# Patient Record
Sex: Female | Born: 1975 | Race: White | Hispanic: No | Marital: Married | Smoking: Former smoker
Health system: Southern US, Community
[De-identification: ages and names within clinical notes are randomized; demographics above are authoritative.]

## PROBLEM LIST (undated history)

## (undated) DIAGNOSIS — R51 Headache: Secondary | ICD-10-CM

## (undated) DIAGNOSIS — F419 Anxiety disorder, unspecified: Secondary | ICD-10-CM

## (undated) DIAGNOSIS — R519 Headache, unspecified: Secondary | ICD-10-CM

## (undated) DIAGNOSIS — K900 Celiac disease: Secondary | ICD-10-CM

## (undated) DIAGNOSIS — M26629 Arthralgia of temporomandibular joint, unspecified side: Secondary | ICD-10-CM

## (undated) HISTORY — DX: Arthralgia of temporomandibular joint, unspecified side: M26.629

## (undated) HISTORY — DX: Headache: R51

## (undated) HISTORY — DX: Headache, unspecified: R51.9

## (undated) HISTORY — DX: Celiac disease: K90.0

## (undated) HISTORY — DX: Anxiety disorder, unspecified: F41.9

---

## 1998-06-25 HISTORY — PX: OTHER SURGICAL HISTORY: SHX169

## 1998-06-28 ENCOUNTER — Ambulatory Visit (HOSPITAL_BASED_OUTPATIENT_CLINIC_OR_DEPARTMENT_OTHER): Admission: RE | Admit: 1998-06-28 | Discharge: 1998-06-28 | Payer: Self-pay | Admitting: General Surgery

## 1998-11-18 ENCOUNTER — Other Ambulatory Visit: Admission: RE | Admit: 1998-11-18 | Discharge: 1998-11-18 | Payer: Self-pay | Admitting: Obstetrics and Gynecology

## 2000-01-26 ENCOUNTER — Other Ambulatory Visit: Admission: RE | Admit: 2000-01-26 | Discharge: 2000-01-26 | Payer: Self-pay | Admitting: Obstetrics and Gynecology

## 2000-03-18 ENCOUNTER — Encounter: Payer: Self-pay | Admitting: Obstetrics and Gynecology

## 2000-03-18 ENCOUNTER — Encounter: Admission: RE | Admit: 2000-03-18 | Discharge: 2000-03-18 | Payer: Self-pay | Admitting: Obstetrics and Gynecology

## 2000-10-17 ENCOUNTER — Encounter (INDEPENDENT_AMBULATORY_CARE_PROVIDER_SITE_OTHER): Payer: Self-pay | Admitting: *Deleted

## 2000-10-17 ENCOUNTER — Ambulatory Visit (HOSPITAL_BASED_OUTPATIENT_CLINIC_OR_DEPARTMENT_OTHER): Admission: RE | Admit: 2000-10-17 | Discharge: 2000-10-17 | Payer: Self-pay | Admitting: General Surgery

## 2000-12-09 ENCOUNTER — Other Ambulatory Visit: Admission: RE | Admit: 2000-12-09 | Discharge: 2000-12-09 | Payer: Self-pay | Admitting: Obstetrics and Gynecology

## 2001-12-05 ENCOUNTER — Other Ambulatory Visit: Admission: RE | Admit: 2001-12-05 | Discharge: 2001-12-05 | Payer: Self-pay | Admitting: Obstetrics and Gynecology

## 2002-05-10 ENCOUNTER — Emergency Department (HOSPITAL_COMMUNITY): Admission: EM | Admit: 2002-05-10 | Discharge: 2002-05-10 | Payer: Self-pay | Admitting: Emergency Medicine

## 2002-08-17 ENCOUNTER — Ambulatory Visit (HOSPITAL_COMMUNITY): Admission: RE | Admit: 2002-08-17 | Discharge: 2002-08-17 | Payer: Self-pay | Admitting: Obstetrics and Gynecology

## 2002-10-12 ENCOUNTER — Observation Stay (HOSPITAL_COMMUNITY): Admission: AD | Admit: 2002-10-12 | Discharge: 2002-10-13 | Payer: Self-pay | Admitting: Obstetrics and Gynecology

## 2002-10-16 ENCOUNTER — Inpatient Hospital Stay (HOSPITAL_COMMUNITY): Admission: AD | Admit: 2002-10-16 | Discharge: 2002-10-17 | Payer: Self-pay | Admitting: Obstetrics & Gynecology

## 2002-10-25 ENCOUNTER — Inpatient Hospital Stay (HOSPITAL_COMMUNITY): Admission: AD | Admit: 2002-10-25 | Discharge: 2002-10-28 | Payer: Self-pay | Admitting: Obstetrics and Gynecology

## 2002-10-26 ENCOUNTER — Encounter (INDEPENDENT_AMBULATORY_CARE_PROVIDER_SITE_OTHER): Payer: Self-pay | Admitting: Specialist

## 2002-11-25 ENCOUNTER — Other Ambulatory Visit: Admission: RE | Admit: 2002-11-25 | Discharge: 2002-11-25 | Payer: Self-pay | Admitting: Obstetrics and Gynecology

## 2004-02-03 ENCOUNTER — Other Ambulatory Visit: Admission: RE | Admit: 2004-02-03 | Discharge: 2004-02-03 | Payer: Self-pay | Admitting: Obstetrics and Gynecology

## 2005-02-09 ENCOUNTER — Other Ambulatory Visit: Admission: RE | Admit: 2005-02-09 | Discharge: 2005-02-09 | Payer: Self-pay | Admitting: Obstetrics and Gynecology

## 2006-09-27 ENCOUNTER — Ambulatory Visit (HOSPITAL_COMMUNITY): Admission: RE | Admit: 2006-09-27 | Discharge: 2006-09-27 | Payer: Self-pay | Admitting: Obstetrics and Gynecology

## 2006-10-18 ENCOUNTER — Inpatient Hospital Stay (HOSPITAL_COMMUNITY): Admission: AD | Admit: 2006-10-18 | Discharge: 2006-10-18 | Payer: Self-pay | Admitting: Obstetrics and Gynecology

## 2006-10-24 ENCOUNTER — Inpatient Hospital Stay (HOSPITAL_COMMUNITY): Admission: AD | Admit: 2006-10-24 | Discharge: 2006-10-24 | Payer: Self-pay | Admitting: Obstetrics and Gynecology

## 2006-12-18 ENCOUNTER — Inpatient Hospital Stay (HOSPITAL_COMMUNITY): Admission: AD | Admit: 2006-12-18 | Discharge: 2006-12-21 | Payer: Self-pay | Admitting: Obstetrics & Gynecology

## 2010-03-01 ENCOUNTER — Encounter: Admission: RE | Admit: 2010-03-01 | Discharge: 2010-03-01 | Payer: Self-pay | Admitting: Obstetrics and Gynecology

## 2010-11-10 NOTE — Discharge Summary (Signed)
[name], [name]                         ACCOUNT NO.:  [id number]   MEDICAL RECORD NO.:  [id number]                   PATIENT TYPE:  INP   LOCATION:  9157                                 FACILITY:  [facility]   PHYSICIAN:  Gerrit Friends. Aldona Bar, M.D.                DATE OF BIRTH:  07/17/1975   DATE OF ADMISSION:  10/16/2002  DATE OF DISCHARGE:  10/17/2002                                 DISCHARGE SUMMARY   DISCHARGE DIAGNOSES:  1. A 36 week pregnancy, undelivered.  2. Uterine irritability - threatened labor (?).  3. Hypokalemia.   HISTORY OF PRESENT ILLNESS:  This 35 year old [name] was seen in  triage during the day on October 16, 2002 with abdominal pain at [redacted] weeks  gestation. Apparently several days earlier she was kept in the hospital for  a period of time for tocolysis with magnesium sulfate and discharged on  Procardia. She related increased pain at about noon on October 16, 2002 and  presented to triage. There was no response to subcutaneous Terbutaline times  two but her cervix was closed, 30% effaced and a vertex at a minus 2 to  minus 3 station. At the time of initial evaluation, there was more uterine  irritability than there was an actual contraction pattern seen. Fetal heart  rate was very reactive. The patient did received some relief with Stadol.  Her urine revealed 4+ ketones. A comprehensive metabolic profile revealed a  potassium of 2.8 with a sodium of 132, hemoglobin of 12.2 and a white count  of 10,900 and a normal platelet count. The patient was admitted and her  hypokalemia was treated. She was also given IV Stadol as needed for  discomfort. Fetal heart rate remained reactive. She responded well to IV  Stadol, IV fluids and an Ambien at bedtime. On the morning of October 17, 2002  her hemoglobin was again stable and her potassium was 4.1. Sodium was 143  and otherwise, her comprehensive metabolic profile was within normal limits.  Her cervix was examined and  again, was closed and maybe 30% effaced with the  vertex floating. The baby's heart rate was very reactive. The decision was  made to discharge the patient to home at relative inactivity with follow-up  in the office in 4-5 days or as needed.   DISCHARGE MEDICATIONS:  Includes Ambien 10 mg at bedtime, good hydration,  and she was told not to take any more Procardia, as had been prescribed for  her upon her previous hospital admission. She was actually given no  tocolysis except the two doses of Terbutaline subcutaneously on the early  afternoon of October 16, 2002. No further tocolysis was given in [redacted] weeks  gestation. I am not sure tocolysis would really be something that should be  continued. So, she is not going to use any further tocolysis.   CONDITION ON DISCHARGE:  Improved.  Gerrit Friends. Aldona Bar, M.D.    RMW/MEDQ  D:  10/17/2002  T:  10/17/2002  Job:  [id number]

## 2010-11-10 NOTE — Op Note (Signed)
[place]. [facility]  Patient:    [name], [name]                      MRN: [id number] Adm. Date:  [id number] Attending:  Henrene Dodge                           Operative Report  PREOPERATIVE DIAGNOSIS:  Tender left inguinal lymphadenopathy, previous diagnosis of a cat scratch disease at that area.  PROCEDURE:  Excision of two left inguinal lymph nodes.  ANESTHESIA:  Local anesthesia with sedation.  SURGEON:  Anselm Pancoast. Zachery Dakins, M.D.  HISTORY:  [name] is a 35 year old female who approximately two years ago had a nonspecific inflammation consistent with cat scratch in the left groin, and I removed the node here at [facility].  She improved, recently has been having tender lymph nodes just proximal to where this previous lymph node was removed, and she has no evidence of any gynecological infection, and saw me recently for this problem.  I recommended that we excise these palpable nodes.  It appears to be two nodes, like a little rope in the subcutaneous area, that is painful when you press on it.  The patient does not have any significant lymphadenopathy in the right side, and because of the discomfort, I recommended that we do it with MAC.  DESCRIPTION OF PROCEDURE:  The patient was taken to the operative suite.  IV had been started.  She was given some sedation, and I marked the palpable node while she was awake.  Then after sedation had been given, we prepped the area with Betadine solution and draped her in a sterile manner.  Xylocaine mixed with 0.25% Marcaine with adrenalin was used to infiltrate the area, and a small incision approximately an inch and a half in length was made.  The underlying subcutaneous tissue was opened up, and then this lymph node, it really was two lymph nodes, kind of like a _____, chronically fibrosed and all, were separated from the surrounding tissue onto a proximal pedicle, clamped with a hemostat, the  lymphoid tissue elevated, and then the dependent area clamped also with a hemostat.  The little pedicles were tied with 4-0 chromic, and then the tissue was sent for permanent exam, touch prep, lymphoma-type workup.  The subcutaneous tissue was closed with a 4-0 chromic, and then the skin was closed with 4-0 nylon simple sutures and a sterile occlusive dressing applied.  The patient will be released after a short stay, and we will have the pathology report back in approximately 48 hours.  Grossly this looks more like a kind of a fibrotic couple of lymph nodes, not that of acute, big, swollen nodes, and I will be very surprised if it is anything serious. DD:  10/17/00 TD:  10/18/00 Job: [id number]

## 2011-04-11 LAB — CBC
HCT: 36.9
HCT: 40.7
Hemoglobin: 12.5
Hemoglobin: 13.9
MCHC: 33.8
MCHC: 34.2
MCV: 92.1
MCV: 92.7
Platelets: 190
Platelets: 249
RBC: 3.98
RBC: 4.42
RDW: 13
RDW: 13
WBC: 10.3
WBC: 13.5 — ABNORMAL HIGH

## 2011-04-11 LAB — RH IMMUNE GLOB WKUP(>/=20WKS)(NOT WOMEN'S HOSP): Fetal Screen: POSITIVE

## 2011-04-11 LAB — RPR: RPR Ser Ql: NONREACTIVE

## 2011-04-11 LAB — KLEIHAUER-BETKE STAIN
# Vials RhIg: 1
Fetal Cells %: 0.1
Quantitation Fetal Hemoglobin: 5

## 2011-10-29 ENCOUNTER — Encounter: Payer: Self-pay | Admitting: Gastroenterology

## 2011-11-26 ENCOUNTER — Ambulatory Visit (INDEPENDENT_AMBULATORY_CARE_PROVIDER_SITE_OTHER): Payer: BC Managed Care – PPO | Admitting: Gastroenterology

## 2011-11-26 ENCOUNTER — Encounter: Payer: Self-pay | Admitting: Gastroenterology

## 2011-11-26 ENCOUNTER — Other Ambulatory Visit: Payer: BC Managed Care – PPO

## 2011-11-26 VITALS — BP 100/66 | HR 64 | Ht 63.0 in | Wt 114.6 lb

## 2011-11-26 DIAGNOSIS — R109 Unspecified abdominal pain: Secondary | ICD-10-CM

## 2011-11-26 DIAGNOSIS — R140 Abdominal distension (gaseous): Secondary | ICD-10-CM

## 2011-11-26 DIAGNOSIS — R141 Gas pain: Secondary | ICD-10-CM

## 2011-11-26 DIAGNOSIS — R143 Flatulence: Secondary | ICD-10-CM

## 2011-11-26 NOTE — Patient Instructions (Signed)
You will have labs checked today in the basement lab.  Please head down after you check out with the front desk  (celiac panel). 

## 2011-11-26 NOTE — Progress Notes (Signed)
HPI: This is a   very pleasant 36 year old woman whose mother is a patient of mine.  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  She made her daughter come.    She has daily bloating usually in PM, afternoon.  Pain and gassiness, very foul smelling.    For one week she went 75% gluten free, this was 2-3 weeks.  Was very constipated in past, needed miralax.  She can alternate, will have loose stool. Much more loose lately.  Review of systems: Pertinent positive and negative review of systems were noted in the above HPI section. Complete review of systems was performed and was otherwise normal.    Past Medical History  Diagnosis Date  . Anxiety     Past Surgical History  Procedure Date  . Lymph node removal     Current Outpatient Prescriptions  Medication Sig Dispense Refill  . Drospirenone-Ethinyl Estradiol-Levomefol (BEYAZ) 3-0.02-0.451 MG tablet Take 1 tablet by mouth daily.      Marland Kitchen PARoxetine (PAXIL) 20 MG tablet Take 20 mg by mouth every morning.        Allergies as of 11/26/2011  . (No Known Allergies)    Family History  Problem Relation Age of Onset  . Hypertension Mother   . Diabetes Mother   . Coronary artery disease Maternal Grandfather   . Celiac disease Mother     History   Social History  . Marital Status: Married    Spouse Name: N/A    Number of Children: 2  . Years of Education: N/A   Occupational History  . Print production planner    Social History Main Topics  . Smoking status: Former Games developer  . Smokeless tobacco: Never Used  . Alcohol Use: No     occasional   . Drug Use: No  . Sexually Active: Not on file   Other Topics Concern  . Not on file   Social History Narrative  . No narrative on file       Physical Exam: BP 100/66  Pulse 64  Ht 5\' 3"  (1.6 m)  Wt 114 lb 9.6 oz (51.982 kg)  BMI 20.30 kg/m2  LMP 11/09/2011 Constitutional: generally well-appearing Psychiatric: alert and oriented x3 Eyes: extraocular movements intact Mouth:  oral pharynx moist, no lesions Neck: supple no lymphadenopathy Cardiovascular: heart regular rate and rhythm Lungs: clear to auscultation bilaterally Abdomen: soft, nontender, nondistended, no obvious ascites, no peritoneal signs, normal bowel sounds Extremities: no lower extremity edema bilaterally Skin: no lesions on visible extremities    Assessment and plan: 36 y.o. female with  bloating, intermittent loose stools, gassiness, mother recently diagnosed with celiac sprue  Think it is definitely possible that she also has celiac sprue. We'll proceed with blood tests and if positive then confirmatory biopsy.

## 2011-11-28 LAB — CELIAC PANEL 10
Endomysial Screen: POSITIVE — AB
Gliadin IgA: 102 U/mL — ABNORMAL HIGH (ref <20)
Gliadin IgG: 196 U/mL — ABNORMAL HIGH (ref <20)
IgA: 224 mg/dL (ref 69–380)
Tissue Transglut Ab: 5.3 U/mL (ref <20)
Tissue Transglutaminase Ab, IgA: 111 U/mL — ABNORMAL HIGH (ref <20)

## 2011-11-28 LAB — ENDOMYSIAL AB IGA TITER: Endomysial Titer: 1:40 {titer} — ABNORMAL HIGH

## 2011-11-29 ENCOUNTER — Telehealth: Payer: Self-pay | Admitting: Gastroenterology

## 2011-11-29 NOTE — Telephone Encounter (Signed)
Pt scheduled for EGD and previsit

## 2011-12-03 ENCOUNTER — Telehealth: Payer: Self-pay | Admitting: Gastroenterology

## 2011-12-03 NOTE — Telephone Encounter (Signed)
Pt would like to move her EGD up because of abd pain and cramping.  Moved to 12/12/11.  Pre visit also moved up and pt is aware

## 2011-12-07 ENCOUNTER — Ambulatory Visit (AMBULATORY_SURGERY_CENTER): Payer: BC Managed Care – PPO | Admitting: *Deleted

## 2011-12-07 VITALS — Ht 64.0 in | Wt 114.0 lb

## 2011-12-07 DIAGNOSIS — K900 Celiac disease: Secondary | ICD-10-CM

## 2011-12-12 ENCOUNTER — Encounter: Payer: Self-pay | Admitting: Gastroenterology

## 2011-12-12 ENCOUNTER — Other Ambulatory Visit: Payer: Self-pay

## 2011-12-12 ENCOUNTER — Ambulatory Visit (AMBULATORY_SURGERY_CENTER): Payer: BC Managed Care – PPO | Admitting: Gastroenterology

## 2011-12-12 VITALS — BP 118/65 | HR 78 | Temp 96.5°F | Resp 16 | Ht 64.0 in | Wt 114.0 lb

## 2011-12-12 DIAGNOSIS — K900 Celiac disease: Secondary | ICD-10-CM

## 2011-12-12 DIAGNOSIS — K209 Esophagitis, unspecified without bleeding: Secondary | ICD-10-CM

## 2011-12-12 DIAGNOSIS — R1310 Dysphagia, unspecified: Secondary | ICD-10-CM

## 2011-12-12 MED ORDER — SODIUM CHLORIDE 0.9 % IV SOLN: 500.0000 mL | INTRAVENOUS | Status: DC

## 2011-12-12 NOTE — Op Note (Signed)
[facility] [address] [place], [place]  [address]  ENDOSCOPY PROCEDURE REPORT  PATIENT:  [name], [name]  MR#:  [id number] BIRTHDATE:  Aug 12, 1975, 35 yrs. old  GENDER:  female ENDOSCOPIST:  Rachael Fee, MD PROCEDURE DATE:  12/12/2011 PROCEDURE:  EGD with biopsy, 43239 ASA CLASS:  Class I INDICATIONS:  mother with sprue, she has dyspepsia, dysphagia, very elevated sprue serologies MEDICATIONS:  Fentanyl 50 mcg IV, These medications were titrated to patient response per physician's verbal order, Versed 5 mg IV TOPICAL ANESTHETIC:  Cetacaine Spray  DESCRIPTION OF PROCEDURE:   After the risks benefits and alternatives of the procedure were thoroughly explained, informed consent was obtained.  The [facility] [id number] endoscope was introduced through the mouth and advanced to the second portion of the duodenum, without limitations.  The instrument was slowly withdrawn as the mucosa was fully examined. <<PROCEDUREIMAGES>> Abnormal appearing mucosa. The mucosa in duodenum with edematous, irregular appearing. 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  Esophagitis was found. There was mild esophagitis (edema) at GE junction (see image1).  Otherwise the examination was normal (see image4, image5, image1, and image6).    Retroflexed views revealed no abnormalities.    The scope was then withdrawn from the patient and the procedure completed. 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 Biopsies taken. 2) (likely) GERD related edema in distal esophagus 3) Otherwise normal examination  RECOMMENDATIONS: Dr. Christella Hartigan' office will arrange for dietician referral to help with avoiding gluten. Please try once daily OTC prilosec or prevacid or generic equivalent for your (probably GERD related) swallowing trouble. Call to report on your symptoms in 4-5  weeks.  ______________________________ Rachael Fee, MD  n. eSIGNED:   Rachael Fee at 12/12/2011 03:28 PM  [place], [phone]

## 2011-12-12 NOTE — Patient Instructions (Addendum)
YOU HAD AN ENDOSCOPIC PROCEDURE TODAY AT THE [facility]: Refer to the procedure report that was given to you for any specific questions about what was found during the examination.  If the procedure report does not answer your questions, please call your gastroenterologist to clarify.  If you requested that your care partner not be given the details of your procedure findings, then the procedure report has been included in a sealed envelope for you to review at your convenience later.  YOU SHOULD EXPECT: Some feelings of bloating in the abdomen. Passage of more gas than usual.  Walking can help get rid of the air that was put into your GI tract during the procedure and reduce the bloating. If you had a lower endoscopy (such as a colonoscopy or flexible sigmoidoscopy) you may notice spotting of blood in your stool or on the toilet paper. If you underwent a bowel prep for your procedure, then you may not have a normal bowel movement for a few days.  DIET: Your first meal following the procedure should be a light meal and then it is ok to progress to your normal diet.  A half-sandwich or bowl of soup is an example of a good first meal.  Heavy or fried foods are harder to digest and may make you feel nauseous or bloated.  Likewise meals heavy in dairy and vegetables can cause extra gas to form and this can also increase the bloating.  Drink plenty of fluids but you should avoid alcoholic beverages for 24 hours.  ACTIVITY: Your care partner should take you home directly after the procedure.  You should plan to take it easy, moving slowly for the rest of the day.  You can resume normal activity the day after the procedure however you should NOT DRIVE or use heavy machinery for 24 hours (because of the sedation medicines used during the test).    SYMPTOMS TO REPORT IMMEDIATELY: A gastroenterologist can be reached at any hour.  During normal business hours, 8:30 AM to 5:00 PM Monday through Friday,  call [phone].  After hours and on weekends, please call the GI answering service at [phone] who will take a message and have the physician on call contact you.    Following upper endoscopy (EGD)  Vomiting of blood or coffee ground material  New chest pain or pain under the shoulder blades  Painful or persistently difficult swallowing  New shortness of breath  Fever of 100F or higher  Black, tarry-looking stools  FOLLOW UP: If any biopsies were taken you will be contacted by phone or by letter within the next 1-3 weeks.  Call your gastroenterologist if you have not heard about the biopsies in 3 weeks.  Our staff will call the home number listed on your records the next business day following your procedure to check on you and address any questions or concerns that you may have at that time regarding the information given to you following your procedure. This is a courtesy call and so if there is no answer at the home number and we have not heard from you through the emergency physician on call, we will assume that you have returned to your regular daily activities without incident.  SIGNATURES/CONFIDENTIALITY: You and/or your care partner have signed paperwork which will be entered into your electronic medical record.  These signatures attest to the fact that that the information above on your After Visit Summary has been reviewed and is understood.  Full   responsibility of the confidentiality of this discharge information lies with you and/or your care-partner.   Resume medications. Information given on sprue and gerd. Office will contact pt. With dietician referral

## 2011-12-12 NOTE — Progress Notes (Signed)
Patient did not experience any of the following events: a burn prior to discharge; a fall within the facility; wrong site/side/patient/procedure/implant event; or a hospital transfer or hospital admission upon discharge from the facility. (G8907) Patient did not have preoperative order for IV antibiotic SSI prophylaxis. (G8918)  

## 2011-12-13 ENCOUNTER — Telehealth: Payer: Self-pay | Admitting: *Deleted

## 2011-12-13 NOTE — Telephone Encounter (Signed)
  Follow up Call-  Call back number 12/12/2011  Post procedure Call Back phone  # [phone]  Permission to leave phone message Yes     Patient questions:  Do you have a fever, pain , or abdominal swelling? no Pain Score  0 *  Have you tolerated food without any problems? yes  Have you been able to return to your normal activities? yes  Do you have any questions about your discharge instructions: Diet   no Medications  no Follow up visit  no  Do you have questions or concerns about your Care? no  Actions: * If pain score is 4 or above: No action needed, pain <4.

## 2011-12-24 ENCOUNTER — Other Ambulatory Visit: Payer: BC Managed Care – PPO | Admitting: Gastroenterology

## 2011-12-31 ENCOUNTER — Telehealth: Payer: Self-pay | Admitting: Gastroenterology

## 2011-12-31 NOTE — Telephone Encounter (Signed)
Pt has been notified of her lab results and will make ROV in 6 months with labs prior

## 2012-05-04 ENCOUNTER — Telehealth: Payer: Self-pay | Admitting: Gastroenterology

## 2012-05-04 NOTE — Telephone Encounter (Signed)
Pt c/o abdominal distention and gas for past 2-3 days.   Denies abdominal pain.  Has celiac disease but is unaware of a dietary indiscretion.  Instructed to call office if symptoms do not improve next 24 hours

## 2012-05-05 ENCOUNTER — Ambulatory Visit (INDEPENDENT_AMBULATORY_CARE_PROVIDER_SITE_OTHER): Payer: BC Managed Care – PPO | Admitting: Gastroenterology

## 2012-05-05 ENCOUNTER — Encounter: Payer: Self-pay | Admitting: Gastroenterology

## 2012-05-05 ENCOUNTER — Other Ambulatory Visit (INDEPENDENT_AMBULATORY_CARE_PROVIDER_SITE_OTHER): Payer: BC Managed Care – PPO

## 2012-05-05 ENCOUNTER — Ambulatory Visit (HOSPITAL_COMMUNITY)
Admission: RE | Admit: 2012-05-05 | Discharge: 2012-05-05 | Disposition: A | Discharge status: Home / Self Care | Payer: BC Managed Care – PPO | Source: Ambulatory Visit | Attending: Gastroenterology | Admitting: Gastroenterology

## 2012-05-05 ENCOUNTER — Telehealth: Payer: Self-pay | Admitting: Gastroenterology

## 2012-05-05 VITALS — BP 100/78 | HR 80 | Ht 63.0 in | Wt 116.1 lb

## 2012-05-05 DIAGNOSIS — K900 Celiac disease: Secondary | ICD-10-CM

## 2012-05-05 DIAGNOSIS — R109 Unspecified abdominal pain: Secondary | ICD-10-CM

## 2012-05-05 DIAGNOSIS — N949 Unspecified condition associated with female genital organs and menstrual cycle: Secondary | ICD-10-CM | POA: Insufficient documentation

## 2012-05-05 DIAGNOSIS — Z975 Presence of (intrauterine) contraceptive device: Secondary | ICD-10-CM | POA: Insufficient documentation

## 2012-05-05 DIAGNOSIS — K5900 Constipation, unspecified: Secondary | ICD-10-CM | POA: Insufficient documentation

## 2012-05-05 LAB — CBC WITH DIFFERENTIAL/PLATELET
Basophils Absolute: 0 10*3/uL (ref 0.0–0.1)
Basophils Relative: 0.7 % (ref 0.0–3.0)
Eosinophils Absolute: 0.1 10*3/uL (ref 0.0–0.7)
Eosinophils Relative: 1.2 % (ref 0.0–5.0)
HCT: 38.1 % (ref 36.0–46.0)
Hemoglobin: 12.7 g/dL (ref 12.0–15.0)
Lymphocytes Relative: 38 % (ref 12.0–46.0)
Lymphs Abs: 2.5 10*3/uL (ref 0.7–4.0)
MCHC: 33.3 g/dL (ref 30.0–36.0)
MCV: 90.7 fl (ref 78.0–100.0)
Monocytes Absolute: 0.5 10*3/uL (ref 0.1–1.0)
Monocytes Relative: 8.2 % (ref 3.0–12.0)
Neutro Abs: 3.3 10*3/uL (ref 1.4–7.7)
Neutrophils Relative %: 51.9 % (ref 43.0–77.0)
Platelets: 256 10*3/uL (ref 150.0–400.0)
RBC: 4.2 Mil/uL (ref 3.87–5.11)
RDW: 12.6 % (ref 11.5–14.6)
WBC: 6.4 10*3/uL (ref 4.5–10.5)

## 2012-05-05 LAB — COMPREHENSIVE METABOLIC PANEL
ALT: 29 U/L (ref 0–35)
AST: 26 U/L (ref 0–37)
Albumin: 4.1 g/dL (ref 3.5–5.2)
Alkaline Phosphatase: 65 U/L (ref 39–117)
BUN: 11 mg/dL (ref 6–23)
CO2: 28 mEq/L (ref 19–32)
Calcium: 9.5 mg/dL (ref 8.4–10.5)
Chloride: 104 mEq/L (ref 96–112)
Creatinine, Ser: 0.5 mg/dL (ref 0.4–1.2)
GFR: 145.86 mL/min (ref >60.00)
Glucose, Bld: 78 mg/dL (ref 70–99)
Potassium: 4.3 mEq/L (ref 3.5–5.1)
Sodium: 139 mEq/L (ref 135–145)
Total Bilirubin: 0.4 mg/dL (ref 0.3–1.2)
Total Protein: 7.3 g/dL (ref 6.0–8.3)

## 2012-05-05 MED ORDER — IOHEXOL 300 MG/ML  SOLN
80.0000 mL | Freq: Once | INTRAMUSCULAR | Status: AC | PRN
  Administered 2012-05-05: 80 mL via INTRAVENOUS

## 2012-05-05 NOTE — Patient Instructions (Addendum)
You will have labs checked today in the basement lab.  Please head down after you check out with the front desk  (cbc, cmet, urine pregnancy test (stat). You will be set up for a CT scan of abdomen and pelvis with IV and oral contrast (for lower abd pains).  You have been scheduled for a CT scan of the abdomen and pelvis at [facility] You are scheduled on 05/05/12 NOW. You should arrive 15 minutes prior to your appointment time for registration. Please follow the written instructions below on the day of your exam:

## 2012-05-05 NOTE — Telephone Encounter (Signed)
Patty, can you call her this AM to see how she is feeling.

## 2012-05-05 NOTE — Telephone Encounter (Signed)
Pt has appt today with Dr Christella Hartigan

## 2012-05-05 NOTE — Telephone Encounter (Signed)
Pt has been added to the schedule for today with Dr Christella Hartigan 3 pm

## 2012-05-05 NOTE — Progress Notes (Signed)
Review of pertinent gastrointestinal problems: 1. Celiac Sprue:  TTG very elevated 2013 (111), mother had Sprue; EGD 11/2011 with abnormal duodenum, biopsies + for sprue  HPI: This is a  very pleasant 36 year old woman whom I last saw several months ago. She called this morning with some abdominal distention and pain. Considering for urgent visit.  Thursday night lower abd pain, has had anal spasm.  No diarrhea. No bleeding. She has no bleeding, no nausea, no vomiting.    Has been on probiotics.    Chugging mylanta.    She feels very consitpated and gassy.  A lot of abdominal pressure.  Has IUD in place.  No vaginal bleeding.  + flatus but not enough     Past Medical History  Diagnosis Date  . Anxiety   . TMJ arthralgia     Past Surgical History  Procedure Date  . Lymph node removal     Current Outpatient Prescriptions  Medication Sig Dispense Refill  . ibuprofen (ADVIL,MOTRIN) 800 MG tablet Take 800 mg by mouth every 8 (eight) hours as needed. TMJ pain      . Multiple Vitamin (MULTIVITAMIN) tablet Take 1 tablet by mouth daily.      Marland Kitchen PARoxetine (PAXIL) 20 MG tablet Take 20 mg by mouth every morning.      . Probiotic Product (PROBIOTIC DAILY PO) Take 1 tablet by mouth daily.       Current Facility-Administered Medications  Medication Dose Route Frequency Provider Last Rate Last Dose  . 0.9 %  sodium chloride infusion  500 mL Intravenous Continuous Rachael Fee, MD        Allergies as of 05/05/2012  . (No Known Allergies)    Family History  Problem Relation Age of Onset  . Hypertension Mother   . Diabetes Mother   . Celiac disease Mother   . Coronary artery disease Maternal Grandfather     History   Social History  . Marital Status: Married    Spouse Name: N/A    Number of Children: 2  . Years of Education: N/A   Occupational History  . Print production planner    Social History Main Topics  . Smoking status: Former Games developer  . Smokeless tobacco: Never Used    . Alcohol Use: No     Comment: occasional   . Drug Use: No  . Sexually Active: Not on file   Other Topics Concern  . Not on file   Social History Narrative  . No narrative on file      Physical Exam: BP 100/78  Pulse 80  Ht 5\' 3"  (1.6 m)  Wt 116 lb 2 oz (52.674 kg)  BMI 20.57 kg/m2 Constitutional: generally well-appearing Psychiatric: alert and oriented x3 Abdomen: soft, mildly tender throughout somewhat worse on the right lower quadrant, nondistended, no obvious ascites, no peritoneal signs, normal bowel sounds     Assessment and plan: 36 y.o. female with abdominal pain, distention  She is uncomfortable, somewhat distended in her low abdomen. She appears not an extremist at all but she is clearly uncomfortable. This is fairly    new for her in the past few days. She has had no fevers or chills but I'm concerned about potential appendicitis, perhaps gynecologic issue such as ovarian cysts. She is going to get a CT scan today as well as labs, urine pregnancy test on a stat basis. On call tonight plan to be called by the radiologist.

## 2012-05-06 LAB — PREGNANCY, URINE: Preg Test, Ur: NEGATIVE

## 2012-05-06 NOTE — Progress Notes (Signed)
i agree, thanks 

## 2012-06-02 ENCOUNTER — Ambulatory Visit (INDEPENDENT_AMBULATORY_CARE_PROVIDER_SITE_OTHER): Payer: BC Managed Care – PPO | Admitting: Gastroenterology

## 2012-06-02 ENCOUNTER — Encounter: Payer: Self-pay | Admitting: Gastroenterology

## 2012-06-02 VITALS — BP 92/60 | HR 76 | Ht 63.0 in | Wt 116.0 lb

## 2012-06-02 DIAGNOSIS — K589 Irritable bowel syndrome without diarrhea: Secondary | ICD-10-CM

## 2012-06-02 DIAGNOSIS — R933 Abnormal findings on diagnostic imaging of other parts of digestive tract: Secondary | ICD-10-CM

## 2012-06-02 MED ORDER — HYOSCYAMINE SULFATE 0.125 MG SL SUBL: 0.1250 mg | SUBLINGUAL_TABLET | SUBLINGUAL | Status: DC | PRN

## 2012-06-02 NOTE — Progress Notes (Signed)
Review of pertinent gastrointestinal problems:  1. Celiac Sprue: TTG very elevated 2013 (111), mother had Sprue; EGD 11/2011 with abnormal duodenum, biopsies + for sprue 2. abd pain 04/2012: cbc, cmet, preg test all neg: CT scan showed signficant stool burden, also abnormal soft tissue in ascending colon.  Pains improved with bowel purge.   HPI: This is a  very pleasant 36 year old woman who I am last saw several weeks ago.  She felt much better after miralax purge.  She has alternating bowels still but the severe pains are much better.  She tried miralax once daily and this helps.  She tried fiber supplements a long time ago.      Past Medical History  Diagnosis Date  . Anxiety   . TMJ arthralgia     Past Surgical History  Procedure Date  . Lymph node removal     Current Outpatient Prescriptions  Medication Sig Dispense Refill  . ibuprofen (ADVIL,MOTRIN) 800 MG tablet Take 800 mg by mouth every 8 (eight) hours as needed. TMJ pain      . Multiple Vitamin (MULTIVITAMIN) tablet Take 1 tablet by mouth daily.      Marland Kitchen PARoxetine (PAXIL) 20 MG tablet Take 20 mg by mouth every morning.      . polyethylene glycol (MIRALAX / GLYCOLAX) packet Take 17 g by mouth daily.       Current Facility-Administered Medications  Medication Dose Route Frequency Provider Last Rate Last Dose  . [DISCONTINUED] 0.9 %  sodium chloride infusion  500 mL Intravenous Continuous Rachael Fee, MD        Allergies as of 06/02/2012  . (No Known Allergies)    Family History  Problem Relation Age of Onset  . Hypertension Mother   . Diabetes Mother   . Celiac disease Mother   . Coronary artery disease Maternal Grandfather     History   Social History  . Marital Status: Married    Spouse Name: N/A    Number of Children: 2  . Years of Education: N/A   Occupational History  . Print production planner    Social History Main Topics  . Smoking status: Former Games developer  . Smokeless tobacco: Never Used  . Alcohol  Use: No     Comment: occasional   . Drug Use: No  . Sexually Active: Not on file   Other Topics Concern  . Not on file   Social History Narrative  . No narrative on file      Physical Exam: BP 92/60  Pulse 76  Ht 5\' 3"  (1.6 m)  Wt 116 lb (52.617 kg)  BMI 20.55 kg/m2 Constitutional: generally well-appearing Psychiatric: alert and oriented x3 Abdomen: soft, nontender, nondistended, no obvious ascites, no peritoneal signs, normal bowel sounds     Assessment and plan: 36 y.o. female with chronic constipation, IBS, abnormal descending colon on recent CT scan  I do not think that she has any neoplastic process or even inflammatory bowel disease clinically however the CT scan is abnormal in the 80s in colon we should proceed with colonoscopy at her soonest convenience. In the meantime she will start taking her MiraLax on a daily basis rather than when necessary. I have also given her a new prescription for sublingual antispasm medicines to help with her cramping that bothers her about once to twice a week.

## 2012-06-02 NOTE — Patient Instructions (Addendum)
You will be set up for a colonoscopy for alternating bowel habits, irregular colon on CT.  Moderate sedation, LEC. Change so that you take the miralax powder every day rather than 3-4 times per week. Trial of sublingual antispasm medicine.

## 2012-06-07 ENCOUNTER — Telehealth: Payer: Self-pay | Admitting: Internal Medicine

## 2012-06-07 NOTE — Telephone Encounter (Signed)
movi prep not at pharmacy. I called it in for her.

## 2012-06-09 ENCOUNTER — Encounter: Payer: Self-pay | Admitting: Gastroenterology

## 2012-06-09 ENCOUNTER — Ambulatory Visit (AMBULATORY_SURGERY_CENTER): Payer: BC Managed Care – PPO | Admitting: Gastroenterology

## 2012-06-09 VITALS — BP 92/48 | HR 77 | Resp 15

## 2012-06-09 DIAGNOSIS — R933 Abnormal findings on diagnostic imaging of other parts of digestive tract: Secondary | ICD-10-CM

## 2012-06-09 DIAGNOSIS — K589 Irritable bowel syndrome without diarrhea: Secondary | ICD-10-CM

## 2012-06-09 MED ORDER — SODIUM CHLORIDE 0.9 % IV SOLN: 500.0000 mL | INTRAVENOUS | Status: DC

## 2012-06-09 NOTE — Op Note (Signed)
[facility] [address] [place], [address]   COLONOSCOPY PROCEDURE REPORT  PATIENT: [name], [name]  MR#: [id number] BIRTHDATE: July 23, 1975 , 36  yrs. old GENDER: Female ENDOSCOPIST: Rachael Fee, MD PROCEDURE DATE:  06/09/2012 PROCEDURE:   Colonoscopy, diagnostic ASA CLASS:   Class II INDICATIONS:recent CT scan suggested abnormal ascending colon. MEDICATIONS: Fentanyl 75 mcg IV and Versed 8 mg IV  DESCRIPTION OF PROCEDURE:   After the risks benefits and alternatives of the procedure were thoroughly explained, informed consent was obtained.  A digital rectal exam revealed no abnormalities of the rectum.   The LB CF-H180AL [id number] and LB PCF-H180AL [id number]  endoscope was introduced through the anus and advanced to the cecum, which was identified by both the appendix and ileocecal valve. No adverse events experienced.   The quality of the prep was good, using MoviPrep  The instrument was then slowly withdrawn as the colon was fully examined.   COLON FINDINGS: A normal appearing cecum, ileocecal valve, and appendiceal orifice were identified.  The ascending, hepatic flexure, transverse, splenic flexure, descending, sigmoid colon and rectum appeared unremarkable.  No polyps or cancers were seen. Retroflexed views revealed no abnormalities. The time to cecum=3 minutes 58 seconds.  Withdrawal time=6 minutes 41 seconds.  The scope was withdrawn and the procedure completed. COMPLICATIONS: There were no complications.  ENDOSCOPIC IMPRESSION: Normal colon  RECOMMENDATIONS: Continue miralax daily and antipasmodics as needed. Routine colon cancer screening should start at age 48 (16 years)   eSigned:  Rachael Fee, MD 06/09/2012 2:44 PM

## 2012-06-09 NOTE — Patient Instructions (Signed)
YOU HAD AN ENDOSCOPIC PROCEDURE TODAY AT THE [facility]: Refer to the procedure report that was given to you for any specific questions about what was found during the examination.  If the procedure report does not answer your questions, please call your gastroenterologist to clarify.  If you requested that your care partner not be given the details of your procedure findings, then the procedure report has been included in a sealed envelope for you to review at your convenience later.  YOU SHOULD EXPECT: Some feelings of bloating in the abdomen. Passage of more gas than usual.  Walking can help get rid of the air that was put into your GI tract during the procedure and reduce the bloating. If you had a lower endoscopy (such as a colonoscopy or flexible sigmoidoscopy) you may notice spotting of blood in your stool or on the toilet paper. If you underwent a bowel prep for your procedure, then you may not have a normal bowel movement for a few days.  DIET: Your first meal following the procedure should be a light meal and then it is ok to progress to your normal diet.  A half-sandwich or bowl of soup is an example of a good first meal.  Heavy or fried foods are harder to digest and may make you feel nauseous or bloated.  Likewise meals heavy in dairy and vegetables can cause extra gas to form and this can also increase the bloating.  Drink plenty of fluids but you should avoid alcoholic beverages for 24 hours.  ACTIVITY: Your care partner should take you home directly after the procedure.  You should plan to take it easy, moving slowly for the rest of the day.  You can resume normal activity the day after the procedure however you should NOT DRIVE or use heavy machinery for 24 hours (because of the sedation medicines used during the test).    SYMPTOMS TO REPORT IMMEDIATELY: A gastroenterologist can be reached at any hour.  During normal business hours, 8:30 AM to 5:00 PM Monday through Friday,  call [phone].  After hours and on weekends, please call the GI answering service at [phone] who will take a message and have the physician on call contact you.   Following lower endoscopy (colonoscopy or flexible sigmoidoscopy):  Excessive amounts of blood in the stool  Significant tenderness or worsening of abdominal pains  Swelling of the abdomen that is new, acute  Fever of 100F or higher    FOLLOW UP: If any biopsies were taken you will be contacted by phone or by letter within the next 1-3 weeks.  Call your gastroenterologist if you have not heard about the biopsies in 3 weeks.  Our staff will call the home number listed on your records the next business day following your procedure to check on you and address any questions or concerns that you may have at that time regarding the information given to you following your procedure. This is a courtesy call and so if there is no answer at the home number and we have not heard from you through the emergency physician on call, we will assume that you have returned to your regular daily activities without incident.  SIGNATURES/CONFIDENTIALITY: You and/or your care partner have signed paperwork which will be entered into your electronic medical record.  These signatures attest to the fact that that the information above on your After Visit Summary has been reviewed and is understood.  Full responsibility of the confidentiality   of this discharge information lies with you and/or your care-partner.     

## 2012-06-09 NOTE — Progress Notes (Signed)
Patient did not experience any of the following events: a burn prior to discharge; a fall within the facility; wrong site/side/patient/procedure/implant event; or a hospital transfer or hospital admission upon discharge from the facility. (G8907) Patient did not have preoperative order for IV antibiotic SSI prophylaxis. (G8918)  

## 2012-06-10 ENCOUNTER — Telehealth: Payer: Self-pay | Admitting: *Deleted

## 2012-06-10 NOTE — Telephone Encounter (Signed)
  Follow up Call-  Call back number 06/09/2012 12/12/2011  Post procedure Call Back phone  # [phone]  Permission to leave phone message Yes Yes    LMOM

## 2012-07-02 ENCOUNTER — Telehealth: Payer: Self-pay

## 2012-07-02 NOTE — Telephone Encounter (Signed)
Pt needs ROV and Iga and TTG voice mail was full and I was unable to leave a message.

## 2012-07-02 NOTE — Telephone Encounter (Signed)
Message copied by Donata Duff on Wed Jul 02, 2012  8:12 AM ------      Message from: Donata Duff      Created: Mon Dec 31, 2011  1:13 PM       Pt needs labs and rov see results note

## 2012-07-03 NOTE — Telephone Encounter (Signed)
Voice mail is full and can not take any more messages letter mailed

## 2012-07-09 ENCOUNTER — Other Ambulatory Visit: Payer: Self-pay

## 2012-07-09 DIAGNOSIS — K900 Celiac disease: Secondary | ICD-10-CM

## 2012-07-29 ENCOUNTER — Ambulatory Visit (INDEPENDENT_AMBULATORY_CARE_PROVIDER_SITE_OTHER): Payer: BC Managed Care – PPO | Admitting: Gastroenterology

## 2012-07-29 ENCOUNTER — Encounter: Payer: Self-pay | Admitting: Gastroenterology

## 2012-07-29 ENCOUNTER — Other Ambulatory Visit (INDEPENDENT_AMBULATORY_CARE_PROVIDER_SITE_OTHER): Payer: BC Managed Care – PPO

## 2012-07-29 VITALS — BP 108/62 | HR 66 | Ht 63.0 in | Wt 114.0 lb

## 2012-07-29 DIAGNOSIS — R140 Abdominal distension (gaseous): Secondary | ICD-10-CM

## 2012-07-29 DIAGNOSIS — K900 Celiac disease: Secondary | ICD-10-CM

## 2012-07-29 DIAGNOSIS — R141 Gas pain: Secondary | ICD-10-CM

## 2012-07-29 DIAGNOSIS — R143 Flatulence: Secondary | ICD-10-CM

## 2012-07-29 LAB — IGA: IgA: 240 mg/dL (ref 68–378)

## 2012-07-29 NOTE — Patient Instructions (Addendum)
You will have labs checked today in the basement lab.  Please head down after you check out with the front desk  (celiac panel).

## 2012-07-29 NOTE — Progress Notes (Signed)
Review of pertinent gastrointestinal problems:  1. Celiac Sprue: TTG very elevated 2013 (111), mother had Sprue; EGD 11/2011 with abnormal duodenum, biopsies + for sprue  2. abd pain 04/2012: cbc, cmet, preg test all neg: CT scan showed signficant stool burden, also abnormal soft tissue in ascending colon. Pains improved with bowel purge. 3. Colonoscopy 12/13 for abnormal CT scan (above) was normal   HPI: This is a  very pleasant 37 year old woman whom I last saw a few months ago.  She still has bloating.  She has BMs daily.  She completely avoids fiber supplements. She takes Gas-X several times a day. She tries to avoid gluten as best as she can but she knows some get in very rarely  She eats very rarely at restaurant.  Past Medical History  Diagnosis Date  . Anxiety   . TMJ arthralgia   . Celiac sprue     Past Surgical History  Procedure Date  . Lymph node removal     Current Outpatient Prescriptions  Medication Sig Dispense Refill  . hyoscyamine (LEVSIN/SL) 0.125 MG SL tablet Place 1 tablet (0.125 mg total) under the tongue every 4 (four) hours as needed for cramping.  60 tablet  3  . Multiple Vitamin (MULTIVITAMIN) tablet Take 1 tablet by mouth daily.      Marland Kitchen PARoxetine (PAXIL) 20 MG tablet Take 20 mg by mouth every morning.      . polyethylene glycol (MIRALAX / GLYCOLAX) packet Take 17 g by mouth daily.        Allergies as of 07/29/2012  . (No Known Allergies)    Family History  Problem Relation Age of Onset  . Hypertension Mother   . Diabetes Mother   . Celiac disease Mother   . Coronary artery disease Maternal Grandfather     History   Social History  . Marital Status: Married    Spouse Name: N/A    Number of Children: 2  . Years of Education: N/A   Occupational History  . Print production planner    Social History Main Topics  . Smoking status: Former Games developer  . Smokeless tobacco: Never Used  . Alcohol Use: No     Comment: occasional   . Drug Use: No  .  Sexually Active: Not on file   Other Topics Concern  . Not on file   Social History Narrative  . No narrative on file      Physical Exam: BP 108/62  Pulse 66  Ht 5\' 3"  (1.6 m)  Wt 114 lb (51.71 kg)  BMI 20.19 kg/m2 Constitutional: generally well-appearing Psychiatric: alert and oriented x3 Abdomen: soft, nontender, nondistended, no obvious ascites, no peritoneal signs, normal bowel sounds     Assessment and plan: 37 y.o. female with well-documented celiac sprue, persistent bloating  I am suspicious that her underlying celiac sprue is causing her bloating and I wonder if more gluten is getting into her diet and she realizes. We will have her get a blood sample for celiac panel to compare to her previous numbers.

## 2012-07-30 LAB — CELIAC PANEL 10
Endomysial Screen: NEGATIVE
Gliadin IgA: 28 U/mL — ABNORMAL HIGH (ref <20)
Gliadin IgG: 13.5 U/mL (ref <20)
IgA: 180 mg/dL (ref 69–380)
Tissue Transglut Ab: 5 U/mL (ref <20)
Tissue Transglutaminase Ab, IgA: 10.4 U/mL (ref <20)

## 2012-08-11 ENCOUNTER — Telehealth: Payer: Self-pay | Admitting: Gastroenterology

## 2012-08-11 NOTE — Telephone Encounter (Signed)
Pt has been given the results and will call in 1 week after complete dairy avoidance

## 2012-10-30 ENCOUNTER — Other Ambulatory Visit: Payer: Self-pay | Admitting: Obstetrics and Gynecology

## 2013-05-01 ENCOUNTER — Telehealth: Payer: Self-pay | Admitting: Gastroenterology

## 2013-05-01 NOTE — Telephone Encounter (Signed)
Neither are generally problems due to celiac sprue. Can you have her send copies of those labs for further review.

## 2013-05-01 NOTE — Telephone Encounter (Signed)
Left message on machine to call back  

## 2013-05-01 NOTE — Telephone Encounter (Signed)
Dr Christella Hartigan could this be related?

## 2013-05-04 NOTE — Telephone Encounter (Signed)
Left message on machine to call back  

## 2013-05-04 NOTE — Telephone Encounter (Signed)
Pt will have labs faxed for Dr Christella Hartigan review

## 2013-07-06 ENCOUNTER — Other Ambulatory Visit: Payer: Self-pay | Admitting: Gastroenterology

## 2013-10-20 ENCOUNTER — Encounter (INDEPENDENT_AMBULATORY_CARE_PROVIDER_SITE_OTHER): Payer: Self-pay

## 2013-10-20 ENCOUNTER — Encounter: Payer: Self-pay | Admitting: *Deleted

## 2013-10-20 ENCOUNTER — Ambulatory Visit (INDEPENDENT_AMBULATORY_CARE_PROVIDER_SITE_OTHER): Payer: BC Managed Care – PPO | Admitting: Neurology

## 2013-10-20 ENCOUNTER — Encounter: Payer: Self-pay | Admitting: Neurology

## 2013-10-20 VITALS — BP 108/66 | HR 83 | Ht 64.0 in | Wt 116.0 lb

## 2013-10-20 DIAGNOSIS — G43909 Migraine, unspecified, not intractable, without status migrainosus: Secondary | ICD-10-CM | POA: Insufficient documentation

## 2013-10-20 NOTE — Patient Instructions (Signed)
Overall you are doing fairly well but I do want to suggest a few things today:   Remember to drink plenty of fluid, eat healthy meals and do not skip any meals. Try to eat protein with a every meal and eat a healthy snack such as fruit or nuts in between meals. Try to keep a regular sleep-wake schedule and try to exercise daily, particularly in the form of walking, 20-30 minutes a day, if you can.   As far as your medications are concerned, I would like to suggest you continue on the phenergan as needed. I would also like you to try Cambia as needed. Please take both of these medications as soon as you feel the symptoms starting.  If you have another event and these medications do not help then please call our office.   Follow up as needed. Please call us with any interim questions, concerns, problems, updates or refill requests.   My clinical assistant and will answer any of your questions and relay your messages to me and also relay most of my messages to you.   Our phone number is [phone]. We also have an after hours call service for urgent matters and there is a physician on-call for urgent questions. For any emergencies you know to call 911 or go to the nearest emergency room

## 2013-10-20 NOTE — Progress Notes (Signed)
[facility]    Provider:  Dr Hosie PoissonSumner Referring Provider: Bertha StakesAlthisar, Henry, PA-C Primary Care Physician:  Rhetta MuraHENRY ALTHISAR, PA-C  CC:  Headache and hemisensory loss  HPI:  [name] is a 38 y.o. female here as a referral from Dr. Janina MayoAlthisar for headache with unilateral sensory changes. Was driving in a car, initially noted difficulty with vision, floating blind spot, then noted tingling in her left hand, progressed up her left arm, then involved left sided lower extremity, involved the whole left side from head down. Then developed a severe stabbing headache on the right side. Headache started around 30min after the sensory symptoms. Friend noted patient was not making sense during the episode. Similar symptoms happened around 2 years ago, told it was her birth control pill. Went to urgent care, given headache cocktail and symptoms improved. The following day did develop symptoms on the right side. This lasted around 45 minutes. No other episodes. No abnormalities in that day, no changes in her diet. Hydrated well, no change in caffeine intake. No history of visual changes.   Had migraines as a child, not like above symptoms. Otherwise healthy. No history of HTN. No EtOH or tobacco usage. Sleeps well. For the past year has noticed an increase in night sweats.   Review of Systems: Out of a complete 14 system review, the patient complains of only the following symptoms, and all other reviewed systems are negative. + headache, fatigue  History   Social History  . Marital Status: Married    Spouse Name: N/A    Number of Children: 2  . Years of Education: N/A   Occupational History  . Print production plannerffice manager    Social History Main Topics  . Smoking status: Former Games developermoker  . Smokeless tobacco: Never Used  . Alcohol Use: No  . Drug Use: No  . Sexual Activity: Not on file   Other Topics Concern  . Not on file   Social History Narrative   Married 2 children   Right handed   BSBA   3-4 cups daily    Family History  Problem Relation Age of Onset  . Hypertension Mother   . Diabetes Mother   . Celiac disease Mother   . Coronary artery disease Maternal Grandfather     Past Medical History  Diagnosis Date  . Anxiety   . TMJ arthralgia   . Celiac sprue     Past Surgical History  Procedure Laterality Date  . Lymph node removal      Current Outpatient Prescriptions  Medication Sig Dispense Refill  . HYDROcodone-acetaminophen (NORCO/VICODIN) 5-325 MG per tablet Take 1 tablet by mouth every 6 (six) hours as needed for moderate pain.      . hyoscyamine (LEVSIN SL) 0.125 MG SL tablet give 1 tablet by mouth or under the tongue every 4 hours if needed for cramping  60 tablet  3  . Multiple Vitamin (MULTIVITAMIN) tablet Take 1 tablet by mouth daily.      Marland Kitchen. PARoxetine (PAXIL) 20 MG tablet Take 20 mg by mouth every morning.      . promethazine (PHENERGAN) 25 MG tablet Take 25 mg by mouth every 6 (six) hours as needed for nausea or vomiting.       No current facility-administered medications for this visit.    Allergies as of 10/20/2013  . (No Known Allergies)    Vitals: BP 108/66  Pulse 83  Ht 5\' 4"  (1.626 m)  Wt 116 lb (52.617 kg)  BMI 19.90 kg/m2 Last Weight:  Wt Readings from Last 1 Encounters:  10/20/13 116 lb (52.617 kg)   Last Height:   Ht Readings from Last 1 Encounters:  10/20/13 5\' 4"  (1.626 m)     Physical exam: Exam: Gen: NAD, conversant Eyes: anicteric sclerae, moist conjunctivae HENT: Atraumatic, oropharynx clear Neck: Trachea midline; supple,  Lungs: CTA, no wheezing, rales, rhonic                          CV: RRR, no MRG Abdomen: Soft, non-tender;  Extremities: No peripheral edema  Skin: Normal temperature, no rash,  Psych: Appropriate affect, pleasant  Neuro: MS: AA&Ox3, appropriately interactive, normal affect   Attention: WORLD backwards  Speech: fluent w/o paraphasic error  Memory: good recent and remote  recall  CN: PERRL,VFF to FC bilaterally,  EOMI no nystagmus, no ptosis, sensation intact to LT V1-V3 bilat, face symmetric, no weakness, hearing grossly intact, palate elevates symmetrically, shoulder shrug 5/5 bilat,  tongue protrudes midline, no fasiculations noted.  Motor: normal bulk and tone Strength: 5/5  In all extremities  Coord: rapid alternating and point-to-point (FNF, HTS) movements intact.  Reflexes: symmetrical, bilat downgoing toes  Sens: LT intact in all extremities  Gait: posture, stance, stride and arm-swing normal. Tandem gait intact. Able to walk on heels and toes. Romberg absent.   Assessment:  After physical and neurologic examination, review of laboratory studies, imaging, neurophysiology testing and pre-existing records, assessment will be reviewed on the problem list.  Plan:  Treatment plan and additional workup will be reviewed under Problem List.  1)Complex migraine  37y/o woman presenting for initial evaluation of history concerning for complex migraine with hemisensory loss. Patient has had 2 episodes total with the most recent last week. Will check brain MRI. Would avoid triptan therapy in this patient. As events are so infrequent will not start prophylactic therapy at this time. If symptoms start to occur more frequently would consider starting an agent. Will use PRN phenergan and Cambia for now. Patient instructed to call office if she has another episode.   Elspeth ChoPeter Kaleem Sartwell, DO  [facility] [address] [place], [place] [id number]  Phone [phone] Fax [phone]

## 2013-10-22 ENCOUNTER — Ambulatory Visit (INDEPENDENT_AMBULATORY_CARE_PROVIDER_SITE_OTHER): Payer: BC Managed Care – PPO

## 2013-10-22 DIAGNOSIS — G43909 Migraine, unspecified, not intractable, without status migrainosus: Secondary | ICD-10-CM

## 2013-10-23 ENCOUNTER — Telehealth: Payer: Self-pay | Admitting: *Deleted

## 2013-10-23 NOTE — Telephone Encounter (Signed)
Patient was returning Dana's call, but didn't know which Annabelle Harmanana.  Thanks

## 2013-10-26 ENCOUNTER — Ambulatory Visit: Payer: BC Managed Care – PPO | Admitting: Neurology

## 2013-10-26 NOTE — Telephone Encounter (Signed)
Patient was given her MRI results and advised to call the office with any questions or concerns.  Patient verbalized understanding.

## 2013-11-12 ENCOUNTER — Other Ambulatory Visit: Payer: Self-pay | Admitting: Obstetrics and Gynecology

## 2014-01-04 ENCOUNTER — Ambulatory Visit (HOSPITAL_COMMUNITY)
Admission: RE | Admit: 2014-01-04 | Discharge: 2014-01-04 | Disposition: A | Discharge status: Home / Self Care | Payer: BC Managed Care – PPO | Source: Ambulatory Visit | Attending: Family Medicine | Admitting: Family Medicine

## 2014-01-04 ENCOUNTER — Other Ambulatory Visit (HOSPITAL_COMMUNITY): Payer: Self-pay | Admitting: Family Medicine

## 2014-01-04 DIAGNOSIS — Z975 Presence of (intrauterine) contraceptive device: Secondary | ICD-10-CM | POA: Insufficient documentation

## 2014-01-04 DIAGNOSIS — N83209 Unspecified ovarian cyst, unspecified side: Secondary | ICD-10-CM | POA: Insufficient documentation

## 2014-01-04 DIAGNOSIS — R52 Pain, unspecified: Secondary | ICD-10-CM

## 2014-01-04 DIAGNOSIS — N72 Inflammatory disease of cervix uteri: Secondary | ICD-10-CM | POA: Insufficient documentation

## 2014-01-04 DIAGNOSIS — N949 Unspecified condition associated with female genital organs and menstrual cycle: Secondary | ICD-10-CM | POA: Insufficient documentation

## 2014-01-28 NOTE — Telephone Encounter (Signed)
Noted  

## 2014-04-14 ENCOUNTER — Telehealth: Payer: Self-pay | Admitting: *Deleted

## 2014-04-14 NOTE — Telephone Encounter (Signed)
Called to follow up with patient.  Message left for her to call if/when she needs to follow up.

## 2014-09-27 ENCOUNTER — Telehealth: Payer: Self-pay | Admitting: Gastroenterology

## 2014-09-27 NOTE — Telephone Encounter (Signed)
Pt states she is having upper abdominal pain that comes and goes, has been going on for about 4-5 days. Pt states this has been happening after he eats. Pt wanting to be seen today. Discussed with pt that we could see her on Wed but no appts are available for today. Pt has called her PCP and has not heard back from them. Discussed with pt that she could go to the ER or urgent care if she needs to be seen today. Pt verbalized understanding.

## 2014-09-30 ENCOUNTER — Telehealth: Payer: Self-pay | Admitting: Gastroenterology

## 2014-10-01 NOTE — Telephone Encounter (Signed)
Pt saw PCP and was put on PPI BID for "tight feeliing" around the rib area. She has had no relief so far and was told she may have an ulcer and to follow up with GI.  APPT given for 10/05/14 with Gunnar FusiPaula pt was also notified to call her Insurance and Primary care physician to ensure you have a referral prior to your appointments.

## 2014-10-04 ENCOUNTER — Telehealth: Payer: Self-pay | Admitting: Nurse Practitioner

## 2014-10-04 NOTE — Telephone Encounter (Signed)
Patient went to Urgent Care over the weekend for pain in rib cage and shooting pain down her leg. The leg pain is better. She was told by one MD that she could have an ulcer and told by another that she needed to take NSAID for pain. She is not sure what to do and wanted to see if we had any cancellations today. We do not have any cancellations. She has scheduled OV tomorrow.

## 2014-10-05 ENCOUNTER — Other Ambulatory Visit (INDEPENDENT_AMBULATORY_CARE_PROVIDER_SITE_OTHER): Payer: BLUE CROSS/BLUE SHIELD

## 2014-10-05 ENCOUNTER — Ambulatory Visit (INDEPENDENT_AMBULATORY_CARE_PROVIDER_SITE_OTHER): Payer: BLUE CROSS/BLUE SHIELD | Admitting: Nurse Practitioner

## 2014-10-05 ENCOUNTER — Encounter: Payer: Self-pay | Admitting: Nurse Practitioner

## 2014-10-05 VITALS — BP 114/60 | HR 76 | Ht 63.0 in | Wt 115.1 lb

## 2014-10-05 DIAGNOSIS — R1013 Epigastric pain: Secondary | ICD-10-CM

## 2014-10-05 LAB — CBC WITH DIFFERENTIAL/PLATELET
Basophils Absolute: 0.1 10*3/uL (ref 0.0–0.1)
Basophils Relative: 0.8 % (ref 0.0–3.0)
Eosinophils Absolute: 0.1 10*3/uL (ref 0.0–0.7)
Eosinophils Relative: 1.4 % (ref 0.0–5.0)
HCT: 40 % (ref 36.0–46.0)
Hemoglobin: 13.5 g/dL (ref 12.0–15.0)
Lymphocytes Relative: 34.5 % (ref 12.0–46.0)
Lymphs Abs: 2.2 10*3/uL (ref 0.7–4.0)
MCHC: 33.9 g/dL (ref 30.0–36.0)
MCV: 89.5 fl (ref 78.0–100.0)
Monocytes Absolute: 0.4 10*3/uL (ref 0.1–1.0)
Monocytes Relative: 6 % (ref 3.0–12.0)
Neutro Abs: 3.7 10*3/uL (ref 1.4–7.7)
Neutrophils Relative %: 57.3 % (ref 43.0–77.0)
Platelets: 255 10*3/uL (ref 150.0–400.0)
RBC: 4.47 Mil/uL (ref 3.87–5.11)
RDW: 12.9 % (ref 11.5–15.5)
WBC: 6.4 10*3/uL (ref 4.0–10.5)

## 2014-10-05 LAB — COMPREHENSIVE METABOLIC PANEL
ALT: 16 U/L (ref 0–35)
AST: 17 U/L (ref 0–37)
Albumin: 4.2 g/dL (ref 3.5–5.2)
Alkaline Phosphatase: 44 U/L (ref 39–117)
BUN: 16 mg/dL (ref 6–23)
CO2: 30 mEq/L (ref 19–32)
Calcium: 9.6 mg/dL (ref 8.4–10.5)
Chloride: 103 mEq/L (ref 96–112)
Creatinine, Ser: 0.55 mg/dL (ref 0.40–1.20)
GFR: 131.89 mL/min (ref >60.00)
Glucose, Bld: 89 mg/dL (ref 70–99)
Potassium: 3.9 mEq/L (ref 3.5–5.1)
Sodium: 137 mEq/L (ref 135–145)
Total Bilirubin: 0.3 mg/dL (ref 0.2–1.2)
Total Protein: 7 g/dL (ref 6.0–8.3)

## 2014-10-05 LAB — AMYLASE: Amylase: 69 U/L (ref 27–131)

## 2014-10-05 LAB — LIPASE: Lipase: 31 U/L (ref 11.0–59.0)

## 2014-10-05 NOTE — Progress Notes (Signed)
     History of Present Illness:   Patient is a [age] year old female followed by Dr. Christella HartiganJacobs for celiac disease. She is here for epigastric pain which started last week. Pain is new, it is radating through to her back. Also feels like her chest is tight, like she has a steel bra on.  She saw [facility] Laur[facility]ters [facility] and was prescribed Protonix but it hasn't helped. She doubled dose per PCP recommendations but that did not help. Patient went to Urgent Care, prescribed Naproxen which hasn't helped either. She is following a limited diet void of gluten, dairy, ETOH and fried foods. She has no history of significant NSAID use. No recent strenuous exercise. Works at [employer].  Doesn't feel constipated which usually gives her lower abdominal pain.   Checked temp, it was 99.5 on Saturday. No coughing. Some SOB which she attributes to the pain.    Current Medications, Allergies, Past Medical History, Past Surgical History, Family History and Social History were reviewed in [facility] electronic medical record.  Physical Exam: General: Pleasant, well developed , white female in no acute distress Head: Normocephalic and atraumatic Eyes:  sclerae anicteric, conjunctiva pink  Ears: Normal auditory acuity Lungs: Clear throughout to auscultation Heart: Regular rate and rhythm Abdomen: Soft, non distended, mild epigastric tenderness.  No masses, no hepatomegaly. Normal bowel sounds Musculoskeletal: Symmetrical with no gross deformities  Extremities: No edema  Neurological: Alert oriented x 4, grossly nonfocal Psychological:  Alert and cooperative. Normal mood and affect  Assessment and Recommendations:    [age] year old female followed by Dr. Christella HartiganJacobs for celiac disease. She presents with upper abdominal pain radiating through to back as well as chest discomfort (feels like a bubble in chest). Symptoms not responding to high-dose PPI nor NSAIDs prescribed by PCP.  Etiology unclear. Will check CBC, CMET,  amylase, lipase. Obtain abdominal ultrasound.  Will schedule her tentatively for an upper endoscopy (after U/S)  in case above studies are negative and pain persists. We can always cancel the upper endoscopy depending on clinical course.  Will call patient with results as they become available.

## 2014-10-05 NOTE — Patient Instructions (Signed)
Please go to the basement level to have your labs drawn.    You have been scheduled for an endoscopy and colonoscopy.. Please follow written instructions given to you at your visit today. If you use inhalers (even only as needed), please bring them with you on the day of your procedure. Your physician has requested that you go to www.startemmi.com and enter the access code given to you at your visit today. This web site gives a general overview about your procedure. However, you should still follow specific instructions given to you by our office regarding your preparation for the procedure.  [facility] Friday [facility]-15 at 7:30 am.  Please arrive at 7;15 am   prior to your appointment for registration.  Go in the front door of hospital, make a left and go to the 2nd door on the left to the Ultrasound department.  Make certain not to have anything to eat or drink after midnight to your appointment. Should you need to reschedule your appointment, please contact radiology at [phone]. This test typically takes about 30 minutes to perform.

## 2014-10-06 ENCOUNTER — Encounter: Payer: Self-pay | Admitting: Nurse Practitioner

## 2014-10-06 ENCOUNTER — Encounter: Payer: Self-pay | Admitting: Gastroenterology

## 2014-10-07 ENCOUNTER — Telehealth: Payer: Self-pay | Admitting: Nurse Practitioner

## 2014-10-07 ENCOUNTER — Other Ambulatory Visit: Payer: Self-pay | Admitting: Gastroenterology

## 2014-10-07 ENCOUNTER — Telehealth: Payer: Self-pay | Admitting: Gastroenterology

## 2014-10-07 ENCOUNTER — Telehealth: Payer: Self-pay | Admitting: *Deleted

## 2014-10-07 NOTE — Telephone Encounter (Signed)
See phone note from today for additional details.  This is a duplicate note

## 2014-10-07 NOTE — Telephone Encounter (Signed)
Patient reports that she is in terrible pain and the tramadol is not helping. She wants to know if she goes to the ER will it speed up her work up.  She is advised that if she is in terrible pain of course she can go to the ER for pain relief, but it is unlikely she would be get the US while at the ER.  I offered to discuss an antispasmodic with Willette ClusterPaula Guenther RNP, the patient reports that she has levsin at home.  She is advised to try one or two SL q 4 hours and take Tramadol as prescribed.  I reviewed a low fat bland diet with her and encouraged her to keep the appt for tomorrow for the US.  She is asked to call here tomorrow if she has not heard from us by 2 pm.  She is also scheduled for EGD for 10/13/14 if the US is negative.

## 2014-10-07 NOTE — Telephone Encounter (Signed)
Patient reports that she has continued pain.  She is notified that her labs were all normal.  She is advised to keep the appt for US scheduled tomorrow.  She is advised that we will call her with the results and plan once we have them and they have been reviewed by Willette ClusterPaula Guenther RNP

## 2014-10-07 NOTE — Telephone Encounter (Signed)
Patient left a message requesting lab results.

## 2014-10-07 NOTE — Progress Notes (Signed)
I agree with the above note, plan 

## 2014-10-08 ENCOUNTER — Ambulatory Visit (HOSPITAL_COMMUNITY)
Admission: RE | Admit: 2014-10-08 | Discharge: 2014-10-08 | Disposition: A | Discharge status: Home / Self Care | Payer: BLUE CROSS/BLUE SHIELD | Source: Ambulatory Visit | Attending: Nurse Practitioner | Admitting: Nurse Practitioner

## 2014-10-08 DIAGNOSIS — R1013 Epigastric pain: Secondary | ICD-10-CM | POA: Insufficient documentation

## 2014-10-08 MED ORDER — TRAMADOL HCL 50 MG PO TABS: 50.0000 mg | ORAL_TABLET | Freq: Four times a day (QID) | ORAL | Status: DC | PRN

## 2014-10-08 NOTE — Telephone Encounter (Signed)
Sheri, we can give her Ultram 50mg  one q6hours prn pain #20. Thanks for moving EGD to sooner date.Marland Kitchen.Marland Kitchen.Marland Kitchen.Gunnar FusiPaula

## 2014-10-08 NOTE — Telephone Encounter (Signed)
Calling beck to check on her Ultrasound results.

## 2014-10-08 NOTE — Telephone Encounter (Signed)
Patient notified of results.  I have rescheduled her appt for Monday pm for EGD.  She is requesting a refill on her Tramodol until EGD on 10/11/14. Gunnar Fusiaula please advise

## 2014-10-08 NOTE — Telephone Encounter (Signed)
I left a message for the patient that rx has been sent to the pharmacy and please keep the appt for Monday for EGD

## 2014-10-11 ENCOUNTER — Ambulatory Visit (AMBULATORY_SURGERY_CENTER): Payer: BLUE CROSS/BLUE SHIELD | Admitting: Gastroenterology

## 2014-10-11 ENCOUNTER — Encounter: Payer: Self-pay | Admitting: Gastroenterology

## 2014-10-11 VITALS — BP 95/42 | HR 70 | Temp 98.7°F | Resp 21 | Ht 63.0 in | Wt 115.0 lb

## 2014-10-11 DIAGNOSIS — R079 Chest pain, unspecified: Secondary | ICD-10-CM

## 2014-10-11 DIAGNOSIS — K900 Celiac disease: Secondary | ICD-10-CM

## 2014-10-11 DIAGNOSIS — K317 Polyp of stomach and duodenum: Secondary | ICD-10-CM

## 2014-10-11 DIAGNOSIS — R1013 Epigastric pain: Secondary | ICD-10-CM | POA: Diagnosis present

## 2014-10-11 MED ORDER — SODIUM CHLORIDE 0.9 % IV SOLN: 500.0000 mL | INTRAVENOUS | Status: DC

## 2014-10-11 NOTE — Progress Notes (Signed)
A/ox3 pleased with MAC, report to Suzanne RN 

## 2014-10-11 NOTE — Patient Instructions (Signed)
YOU HAD AN ENDOSCOPIC PROCEDURE TODAY AT THE [facility]:   Refer to the procedure report that was given to you for any specific questions about what was found during the examination.  If the procedure report does not answer your questions, please call your gastroenterologist to clarify.  If you requested that your care partner not be given the details of your procedure findings, then the procedure report has been included in a sealed envelope for you to review at your convenience later.  YOU SHOULD EXPECT: Some feelings of bloating in the abdomen. Passage of more gas than usual.  Walking can help get rid of the air that was put into your GI tract during the procedure and reduce the bloating. If you had a lower endoscopy (such as a colonoscopy or flexible sigmoidoscopy) you may notice spotting of blood in your stool or on the toilet paper. If you underwent a bowel prep for your procedure, you may not have a normal bowel movement for a few days.  Please Note:  You might notice some irritation and congestion in your nose or some drainage.  This is from the oxygen used during your procedure.  There is no need for concern and it should clear up in a day or so.  SYMPTOMS TO REPORT IMMEDIATELY:    Following upper endoscopy (EGD)  Vomiting of blood or coffee ground material  New chest pain or pain under the shoulder blades  Painful or persistently difficult swallowing  New shortness of breath  Fever of 100F or higher  Black, tarry-looking stools  For urgent or emergent issues, a gastroenterologist can be reached at any hour by calling [phone].   DIET: Your first meal following the procedure should be a small meal and then it is ok to progress to your normal diet. Heavy or fried foods are harder to digest and may make you feel nauseous or bloated.  Likewise, meals heavy in dairy and vegetables can increase bloating.  Drink plenty of fluids but you should avoid alcoholic beverages  for 24 hours. Continue your gluten free diet due to Celiac.  ACTIVITY:  You should plan to take it easy for the rest of today and you should NOT DRIVE or use heavy machinery until tomorrow (because of the sedation medicines used during the test).    FOLLOW UP: Our staff will call the number listed on your records the next business day following your procedure to check on you and address any questions or concerns that you may have regarding the information given to you following your procedure. If we do not reach you, we will leave a message.  However, if you are feeling well and you are not experiencing any problems, there is no need to return our call.  We will assume that you have returned to your regular daily activities without incident.  If any biopsies were taken you will be contacted by phone or by letter within the next 1-3 weeks.  Please call us at [phone] if you have not heard about the biopsies in 3 weeks.    SIGNATURES/CONFIDENTIALITY: You and/or your care partner have signed paperwork which will be entered into your electronic medical record.  These signatures attest to the fact that that the information above on your After Visit Summary has been reviewed and is understood.  Full responsibility of the confidentiality of this discharge information lies with you and/or your care-partner.  Please, arrange an office visit to see Dr. Christella HartiganJacobs.  Biopsy  results will be mailed to you within 10 days. We will call you if Dr. Christella Hartigan wants to do futher testing.

## 2014-10-11 NOTE — Op Note (Signed)
[facility] [address] [place], [id number]   ENDOSCOPY PROCEDURE REPORT  PATIENT: [name], [name]  MR#: [id number] BIRTHDATE: 09-10-1975 , 38  yrs. old GENDER: female ENDOSCOPIST: Meryl DareMalcolm T Ishita Mcnerney, MD, Clementeen GrahamFACG REFERRED BY:  Blenda BridegroomAaron S Morrow, M.D. PROCEDURE DATE:  10/11/2014 PROCEDURE:  EGD w/ biopsy ASA CLASS:     Class II INDICATIONS:  epigastric pain and chest pain. MEDICATIONS: Monitored anesthesia care and Propofol 250 mg IV TOPICAL ANESTHETIC: none DESCRIPTION OF PROCEDURE: After the risks benefits and alternatives of the procedure were thoroughly explained, informed consent was obtained.  The LB [id number] endoscope was introduced through the mouth and advanced to the second portion of the duodenum , Without limitations.  The instrument was slowly withdrawn as the mucosa was fully examined.    ESOPHAGUS: Several linear furrows in mid and distal esophagus. Biopsies obtained.   The esophagus was otherwise normal. STOMACH: Two sessile polyps measuring 3 mm in size were found in the gastric fundus.  Sampling biopsies were performed.   The stomach otherwise appeared normal. DUODENUM: The duodenal mucosa showed no abnormalities in the bulb and 2nd part of the duodenum.  Cold forceps biopsies were taken in the bulb and second portion.  Retroflexed views revealed no abnormalities.     The scope was then withdrawn from the patient and the procedure completed.  COMPLICATIONS: There were no immediate complications.  ENDOSCOPIC IMPRESSION: 1.   Linear furrows in mid and distal esophagus; biopsies obtained 2.   Two sessile polyps in the gastric fundus; multiple sampling biopsies performed 3.   The EGD otherwise appeared normal  RECOMMENDATIONS: 1.  Await pathology results 2.  Follow-up o.v first available with Dr. Christella HartiganJacobs  eSigned:  Meryl DareMalcolm T Artyom Stencel, MD, [facility] 10/11/2014 4:36 PM

## 2014-10-11 NOTE — Progress Notes (Signed)
Called to room to assist during endoscopic procedure.  Patient ID and intended procedure confirmed with present staff. Received instructions for my participation in the procedure from the performing physician.  

## 2014-10-12 ENCOUNTER — Emergency Department (HOSPITAL_COMMUNITY)
Admission: EM | Admit: 2014-10-12 | Discharge: 2014-10-12 | Disposition: A | Discharge status: Home / Self Care | Payer: BLUE CROSS/BLUE SHIELD | Attending: Emergency Medicine | Admitting: Emergency Medicine

## 2014-10-12 ENCOUNTER — Emergency Department (HOSPITAL_COMMUNITY): Payer: BLUE CROSS/BLUE SHIELD

## 2014-10-12 ENCOUNTER — Encounter (HOSPITAL_COMMUNITY): Payer: Self-pay

## 2014-10-12 ENCOUNTER — Telehealth: Payer: Self-pay | Admitting: Gastroenterology

## 2014-10-12 ENCOUNTER — Telehealth: Payer: Self-pay | Admitting: Internal Medicine

## 2014-10-12 ENCOUNTER — Telehealth: Payer: Self-pay | Admitting: *Deleted

## 2014-10-12 DIAGNOSIS — Z791 Long term (current) use of non-steroidal anti-inflammatories (NSAID): Secondary | ICD-10-CM | POA: Insufficient documentation

## 2014-10-12 DIAGNOSIS — Z8711 Personal history of peptic ulcer disease: Secondary | ICD-10-CM | POA: Diagnosis not present

## 2014-10-12 DIAGNOSIS — R1013 Epigastric pain: Secondary | ICD-10-CM | POA: Diagnosis present

## 2014-10-12 DIAGNOSIS — K5900 Constipation, unspecified: Secondary | ICD-10-CM | POA: Insufficient documentation

## 2014-10-12 DIAGNOSIS — Z79899 Other long term (current) drug therapy: Secondary | ICD-10-CM | POA: Diagnosis not present

## 2014-10-12 DIAGNOSIS — Z87891 Personal history of nicotine dependence: Secondary | ICD-10-CM | POA: Insufficient documentation

## 2014-10-12 DIAGNOSIS — F419 Anxiety disorder, unspecified: Secondary | ICD-10-CM | POA: Diagnosis not present

## 2014-10-12 LAB — BASIC METABOLIC PANEL
Anion gap: 6 (ref 5–15)
BUN: 14 mg/dL (ref 6–23)
CO2: 29 mmol/L (ref 19–32)
Calcium: 8.8 mg/dL (ref 8.4–10.5)
Chloride: 105 mmol/L (ref 96–112)
Creatinine, Ser: 0.79 mg/dL (ref 0.50–1.10)
GFR calc Af Amer: >90 mL/min (ref >90)
GFR calc non Af Amer: >90 mL/min (ref >90)
Glucose, Bld: 85 mg/dL (ref 70–99)
Potassium: 3.5 mmol/L (ref 3.5–5.1)
Sodium: 140 mmol/L (ref 135–145)

## 2014-10-12 LAB — URINALYSIS, ROUTINE W REFLEX MICROSCOPIC
Bilirubin Urine: NEGATIVE
Glucose, UA: NEGATIVE mg/dL
Hgb urine dipstick: NEGATIVE
Ketones, ur: NEGATIVE mg/dL
Leukocytes, UA: NEGATIVE
Nitrite: NEGATIVE
Protein, ur: NEGATIVE mg/dL
Specific Gravity, Urine: 1.022 (ref 1.005–1.030)
Urobilinogen, UA: 1 mg/dL (ref 0.0–1.0)
pH: 6 (ref 5.0–8.0)

## 2014-10-12 LAB — HEPATIC FUNCTION PANEL
ALT: 15 U/L (ref 0–35)
AST: 17 U/L (ref 0–37)
Albumin: 4.1 g/dL (ref 3.5–5.2)
Alkaline Phosphatase: 41 U/L (ref 39–117)
Bilirubin, Direct: <0.1 mg/dL (ref 0.0–0.5)
Total Bilirubin: 0.2 mg/dL — ABNORMAL LOW (ref 0.3–1.2)
Total Protein: 6.7 g/dL (ref 6.0–8.3)

## 2014-10-12 LAB — CBC
HCT: 39.6 % (ref 36.0–46.0)
Hemoglobin: 12.9 g/dL (ref 12.0–15.0)
MCH: 30.5 pg (ref 26.0–34.0)
MCHC: 32.6 g/dL (ref 30.0–36.0)
MCV: 93.6 fL (ref 78.0–100.0)
Platelets: 243 10*3/uL (ref 150–400)
RBC: 4.23 MIL/uL (ref 3.87–5.11)
RDW: 12.3 % (ref 11.5–15.5)
WBC: 5.9 10*3/uL (ref 4.0–10.5)

## 2014-10-12 LAB — I-STAT TROPONIN, ED: Troponin i, poc: 0 ng/mL (ref 0.00–0.08)

## 2014-10-12 LAB — LIPASE, BLOOD: Lipase: 26 U/L (ref 11–59)

## 2014-10-12 MED ORDER — IOHEXOL 300 MG/ML  SOLN
100.0000 mL | Freq: Once | INTRAMUSCULAR | Status: AC | PRN
  Administered 2014-10-12: 100 mL via INTRAVENOUS

## 2014-10-12 MED ORDER — IOHEXOL 300 MG/ML  SOLN
50.0000 mL | Freq: Once | INTRAMUSCULAR | Status: AC | PRN
  Administered 2014-10-12: 50 mL via ORAL

## 2014-10-12 MED ORDER — SODIUM CHLORIDE 0.9 % IV BOLUS (SEPSIS)
500.0000 mL | Freq: Once | INTRAVENOUS | Status: AC
Start: 2014-10-12 — End: 2014-10-12
  Administered 2014-10-12: 500 mL via INTRAVENOUS

## 2014-10-12 MED ORDER — FENTANYL CITRATE (PF) 100 MCG/2ML IJ SOLN
50.0000 ug | Freq: Once | INTRAMUSCULAR | Status: AC
  Administered 2014-10-12: 50 ug via INTRAVENOUS
  Filled 2014-10-12: qty 2

## 2014-10-12 NOTE — Telephone Encounter (Signed)
I spoke with her this AM; she describes left chest pressure that is worse with ambulation and associated with SOB.  I explained we need to have her see a cardiologist.  Alexia FreestonePatty, She needs to see a cardiologst today or tomorrow.  If they do not feel her pains are cardiac, then will likely recommend chest/abd CT scanning.  Her pains do not seem very gastrointestinal.

## 2014-10-12 NOTE — Telephone Encounter (Signed)
Patient called from the ER. Had EGD today. Referred to ER today by cardiology for abdominal pain. Had CT that showed increase stool. She wanted to know what to do. She sounds quite well. She told me Dr. Christella HartiganJacobs recommended Miralax bowel purge in past and that helped. I recommended the same and told her to call Dr. Christella HartiganJacobs nurse in am with follow up on her condition.

## 2014-10-12 NOTE — Telephone Encounter (Signed)
Calling back about her Cardiology appt

## 2014-10-12 NOTE — Telephone Encounter (Signed)
  Follow up Call-  Call back number 10/11/2014 06/09/2012  Post procedure Call Back phone  # [phone]  Permission to leave phone message Yes Yes     No answer at # given.  Left message on VM.

## 2014-10-12 NOTE — ED Notes (Signed)
Pt is wanting CT scan. States she called her doctor and he said to call him for an order since she was registered in the ED.  Reassured pt that if EDP felt it is warranted, that he can place order once he has seen her.

## 2014-10-12 NOTE — ED Provider Notes (Signed)
CSN: [id number]     Arrival date & time 10/12/14  1332 History   First MD Initiated Contact with Patient 10/12/14 1527     Chief Complaint  Patient presents with  . Chest Pain     (Consider location/radiation/quality/duration/timing/severity/associated sxs/prior Treatment) HPI..... Epigastric and left upper quadrant abdominal pain for 2-1/2 weeks. Patient has been eating without vomiting or diarrhea. She has also been having bowel movements. She has been seen by her primary care doctor initially who diagnosed peptic ulcer disease and treated her with a PPI. A second visit to the urgent care center stated a diagnosis of (shingles) and prescribed tramadol. The past Wednesday she was seen by gastroenterologist Dr. Perry Mount who performed an endoscopy yesterday which was allegedly normal. Additionally an ultrasound and labs have been normal. She has a past history of celiac disease. Social history: no smoking or drinking. Nothing makes symptoms better or worse. Severity is moderate. No radiation of pain  Past Medical History  Diagnosis Date  . Anxiety   . TMJ arthralgia   . Celiac sprue    Past Surgical History  Procedure Laterality Date  . Lymph node removal     Family History  Problem Relation Age of Onset  . Hypertension Mother   . Diabetes Mother   . Celiac disease Mother   . Coronary artery disease Maternal Grandfather    History  Substance Use Topics  . Smoking status: Former Games developer  . Smokeless tobacco: Never Used  . Alcohol Use: No   OB History    No data available     Review of Systems  All other systems reviewed and are negative.     Allergies  Review of patient's allergies indicates no known allergies.  Home Medications   Prior to Admission medications   Medication Sig Start Date End Date Taking? Authorizing Provider  hyoscyamine (LEVSIN SL) 0.125 MG SL tablet DISSOLVE 1 TABLET UNDER THE TONGUE EVERY 4 HOURS AS NEEDED FOR ABDOMINAL CRAMPING 10/07/14  Yes  Rachael Fee, MD  Multiple Vitamin (MULTIVITAMIN) tablet Take 1 tablet by mouth daily.   Yes Historical Provider, MD  pantoprazole (PROTONIX) 40 MG tablet Take 40 mg by mouth daily. 09/28/14  Yes Historical Provider, MD  PARoxetine (PAXIL) 30 MG tablet Take 30 mg by mouth daily.  09/17/14  Yes Historical Provider, MD  traMADol (ULTRAM) 50 MG tablet Take 1 tablet (50 mg total) by mouth every 6 (six) hours as needed. 10/08/14  Yes Meredith Pel, NP  naproxen (NAPROSYN) 500 MG tablet Take 500 mg by mouth daily.  10/02/14   Historical Provider, MD   BP 100/51 mmHg  Pulse 69  Temp(Src) 99.9 F (37.7 C) (Oral)  Resp 18  SpO2 100% Physical Exam  Constitutional: She is oriented to person, place, and time. She appears well-developed and well-nourished.  HENT:  Head: Normocephalic and atraumatic.  Eyes: Conjunctivae and EOM are normal. Pupils are equal, round, and reactive to light.  Neck: Normal range of motion. Neck supple.  Cardiovascular: Normal rate and regular rhythm.   Pulmonary/Chest: Effort normal and breath sounds normal.  Abdominal: Soft. Bowel sounds are normal.  Minimal epigastric tenderness and left upper quadrant tenderness  Musculoskeletal: Normal range of motion.  Neurological: She is alert and oriented to person, place, and time.  Skin: Skin is warm and dry.  Psychiatric: She has a normal mood and affect. Her behavior is normal.  Nursing note and vitals reviewed.   ED Course  Procedures (including critical care  time) Labs Review Labs Reviewed  HEPATIC FUNCTION PANEL - Abnormal; Notable for the following:    Total Bilirubin 0.2 (*)    All other components within normal limits  CBC  BASIC METABOLIC PANEL  LIPASE, BLOOD  URINALYSIS, ROUTINE W REFLEX MICROSCOPIC  I-STAT TROPOININ, ED    Imaging Review Dg Chest 2 View  10/12/2014   CLINICAL DATA:  Generalized chest pain. Shortness of breath and left upper quadrant pain for 19 days. No nausea, vomiting. History of  celiac sprue. History of smoking.  EXAM: CHEST  2 VIEW  COMPARISON:  None.  FINDINGS: The heart size and mediastinal contours are within normal limits. Both lungs are clear. The visualized skeletal structures are unremarkable.  IMPRESSION: No active cardiopulmonary disease.   Electronically Signed   By: Norva PavlovElizabeth  Brown M.D.   On: 10/12/2014 14:58   Ct Abdomen Pelvis W Contrast  10/12/2014   CLINICAL DATA:  Diffuse abdominal pain and lower abdominal pain. Acute onset.  EXAM: CT ABDOMEN AND PELVIS WITH CONTRAST  TECHNIQUE: Multidetector CT imaging of the abdomen and pelvis was performed using the standard protocol following bolus administration of intravenous contrast.  CONTRAST:  100mL OMNIPAQUE IOHEXOL 300 MG/ML  SOLN  COMPARISON:  Ultrasound 10/08/2014.  CT 05/05/2012.  FINDINGS: Lung bases are clear. No pleural or pericardial fluid. The liver has a normal appearance without focal lesions. No calcified gallstones. The spleen is normal. The pancreas is normal. The adrenal glands are normal. The kidneys are normal. The aorta and IVC are normal. No retroperitoneal mass or lymphadenopathy. The uterus is normal. IUD in place. The ovaries are normal. No free fluid. There is a very large amount of stool and gas throughout the colon which could be symptomatic. The appendix is normal. No abnormal bone finding.  IMPRESSION: Very large amount of stool and gas throughout the colon which could be symptomatic. No other finding of note.   Electronically Signed   By: Paulina FusiMark  Shogry M.D.   On: 10/12/2014 19:35     EKG Interpretation   Date/Time:  Tuesday October 12 2014 13:47:58 EDT Ventricular Rate:  92 PR Interval:  125 QRS Duration: 107 QT Interval:  347 QTC Calculation: 429 R Axis:   71 Text Interpretation:  Sinus rhythm RSR' in V1 or V2, right VCD or RVH  Confirmed by Fayrene FearingJAMES  MD, MARK ([id number]) on 10/12/2014 3:40:15 PM      MDM   Final diagnoses:  Epigastric pain  Constipation, unspecified constipation type     No acute abdomen.  CT scan reveals a large amount of stool and gas throughout the colon. I discussed these findings with the patient and her husband. They will try over-the-counter medications and a follow-up with gastroenterology    Donnetta HutchingBrian Bernardo Brayman, MD 10/12/14 2044

## 2014-10-12 NOTE — ED Notes (Signed)
Pt has chest pain x 19 or so days. Pt was treated for GI complaints.  Has had endoscopy and ultrasounds.  No dx.  Pt told to come here for further eval.  Pain is at bra line and to lower left rib.  Pt states today warm pain in left neck.

## 2014-10-12 NOTE — ED Notes (Signed)
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 She reports SOB that gets worse with exertion, sts was told by her doctor that she needs an abdominal or chest CT.

## 2014-10-12 NOTE — Telephone Encounter (Signed)
Warm tingling on left side of throat, some dysphagia.  I spoke with the triage nurse at cardiologist and they recommend the pt go to the ER for evaluation.  I spoke to the pt and she agrees to go to the ER now for evaluation, she will not drive.

## 2014-10-12 NOTE — ED Notes (Signed)
Patient is in xray.  I will collect her blood when she comes out.

## 2014-10-12 NOTE — Discharge Instructions (Signed)
CT scan reveals constipation. Increase fluids, fruits, fibers. Other recommended products include Miralax, magnesium citrate, Dulcolax, fleets enema

## 2014-10-13 ENCOUNTER — Encounter: Payer: BLUE CROSS/BLUE SHIELD | Admitting: Gastroenterology

## 2014-10-13 ENCOUNTER — Ambulatory Visit (HOSPITAL_COMMUNITY): Payer: BLUE CROSS/BLUE SHIELD

## 2014-10-15 ENCOUNTER — Encounter: Payer: BLUE CROSS/BLUE SHIELD | Admitting: Gastroenterology

## 2014-10-19 ENCOUNTER — Encounter: Payer: Self-pay | Admitting: Gastroenterology

## 2015-11-24 DIAGNOSIS — H5213 Myopia, bilateral: Secondary | ICD-10-CM | POA: Diagnosis not present

## 2016-03-22 DIAGNOSIS — R300 Dysuria: Secondary | ICD-10-CM | POA: Diagnosis not present

## 2016-03-29 DIAGNOSIS — L700 Acne vulgaris: Secondary | ICD-10-CM | POA: Diagnosis not present

## 2016-03-30 DIAGNOSIS — Z79899 Other long term (current) drug therapy: Secondary | ICD-10-CM | POA: Diagnosis not present

## 2016-03-30 DIAGNOSIS — L700 Acne vulgaris: Secondary | ICD-10-CM | POA: Diagnosis not present

## 2016-05-02 DIAGNOSIS — Z79899 Other long term (current) drug therapy: Secondary | ICD-10-CM | POA: Diagnosis not present

## 2016-05-02 DIAGNOSIS — L700 Acne vulgaris: Secondary | ICD-10-CM | POA: Diagnosis not present

## 2016-05-03 DIAGNOSIS — L700 Acne vulgaris: Secondary | ICD-10-CM | POA: Diagnosis not present

## 2016-05-08 DIAGNOSIS — R140 Abdominal distension (gaseous): Secondary | ICD-10-CM | POA: Diagnosis not present

## 2016-05-08 DIAGNOSIS — K5904 Chronic idiopathic constipation: Secondary | ICD-10-CM | POA: Diagnosis not present

## 2016-05-08 DIAGNOSIS — K900 Celiac disease: Secondary | ICD-10-CM | POA: Diagnosis not present

## 2016-05-08 DIAGNOSIS — K594 Anal spasm: Secondary | ICD-10-CM | POA: Diagnosis not present

## 2016-05-09 DIAGNOSIS — F411 Generalized anxiety disorder: Secondary | ICD-10-CM | POA: Diagnosis not present

## 2016-05-09 DIAGNOSIS — Z23 Encounter for immunization: Secondary | ICD-10-CM | POA: Diagnosis not present

## 2016-05-09 DIAGNOSIS — K5900 Constipation, unspecified: Secondary | ICD-10-CM | POA: Diagnosis not present

## 2016-06-07 DIAGNOSIS — Z79899 Other long term (current) drug therapy: Secondary | ICD-10-CM | POA: Diagnosis not present

## 2016-06-07 DIAGNOSIS — L700 Acne vulgaris: Secondary | ICD-10-CM | POA: Diagnosis not present

## 2016-06-08 DIAGNOSIS — L700 Acne vulgaris: Secondary | ICD-10-CM | POA: Diagnosis not present

## 2016-06-13 DIAGNOSIS — J069 Acute upper respiratory infection, unspecified: Secondary | ICD-10-CM | POA: Diagnosis not present

## 2016-06-13 DIAGNOSIS — J029 Acute pharyngitis, unspecified: Secondary | ICD-10-CM | POA: Diagnosis not present

## 2016-06-13 DIAGNOSIS — R509 Fever, unspecified: Secondary | ICD-10-CM | POA: Diagnosis not present

## 2016-06-27 ENCOUNTER — Ambulatory Visit (INDEPENDENT_AMBULATORY_CARE_PROVIDER_SITE_OTHER): Payer: BLUE CROSS/BLUE SHIELD | Admitting: Diagnostic Neuroimaging

## 2016-06-27 ENCOUNTER — Encounter: Payer: Self-pay | Admitting: *Deleted

## 2016-06-27 VITALS — BP 101/64 | HR 77 | Ht 63.75 in | Wt 117.8 lb

## 2016-06-27 DIAGNOSIS — G43109 Migraine with aura, not intractable, without status migrainosus: Secondary | ICD-10-CM

## 2016-06-27 MED ORDER — RIZATRIPTAN BENZOATE 10 MG PO TBDP: 10.0000 mg | ORAL_TABLET | ORAL | 11 refills | Status: DC | PRN

## 2016-06-27 MED ORDER — DICLOFENAC POTASSIUM(MIGRAINE) 50 MG PO PACK: 50.0000 mg | PACK | ORAL | 3 refills | Status: DC | PRN

## 2016-06-27 NOTE — Progress Notes (Signed)
[facility]  PATIENT: [name] DOB: August 30, 1975  REFERRING CLINICIAN: A Morrow HISTORY FROM: patient  REASON FOR VISIT: new consult / existing patient    HISTORICAL  CHIEF COMPLAINT:  Chief Complaint  Patient presents with  . Migraine    rm 6, New Pt, "2 migtraine sin past 3 days w/nausea/vomiting; fainted after vomiting on one occasiona"    HISTORY OF PRESENT ILLNESS:   41 year old female here for alteration of migraine headaches. Patient has had migraine headaches since middle school. She describes severe unilateral, throbbing painful headaches with nausea, vomiting, photophobia. Sometimes these are preceded by sparkling visual aura and scintillating scotoma. Patient has had headaches throughout her life, intermittently, has seen headache wellness center in the past, tried some rescue medicines such as Imitrex and Cambia with mild relief.  During pregnancy 2003, headaches resolved. She had no further headaches until 2015. At that time she had some complicated migraine symptoms (vision loss, left sided numbness) and was seen by Dr. Hosie Poisson.   Since then patient's symptoms were under control until 05/26/2016. Patient had severe migraine headache attack. In 06/24/2016 patient had another migraine attack while on a ski trip with family. Patient had additional migraine attack on 06/26/16. No specific triggering or aggravating factors. However patient has started medication Accutane November 2017, and she is concerned that this may have aggravated her migraines. Patient was on this medication at age 89, but does not recall aggravation of migraine at that time.  In addition patient has low-level daily nagging headaches that she wakes up with every day. She uses ibuprofen, Excedrin Migraine, Tylenol as needed. She does not use these on a daily basis.    REVIEW OF SYSTEMS: Full 14 system review of systems performed and negative with exception of:  headaches.  ALLERGIES: No Known Allergies  HOME MEDICATIONS: Outpatient Medications Prior to Visit  Medication Sig Dispense Refill  . hyoscyamine (LEVSIN SL) 0.125 MG SL tablet DISSOLVE 1 TABLET UNDER THE TONGUE EVERY 4 HOURS AS NEEDED FOR ABDOMINAL CRAMPING 60 tablet 3  . Multiple Vitamin (MULTIVITAMIN) tablet Take 1 tablet by mouth daily.    . naproxen (NAPROSYN) 500 MG tablet Take 500 mg by mouth daily.   0  . pantoprazole (PROTONIX) 40 MG tablet Take 40 mg by mouth daily.  0  . PARoxetine (PAXIL) 30 MG tablet Take 30 mg by mouth daily.   3  . traMADol (ULTRAM) 50 MG tablet Take 1 tablet (50 mg total) by mouth every 6 (six) hours as needed. 20 tablet 0   No facility-administered medications prior to visit.     PAST MEDICAL HISTORY: Past Medical History:  Diagnosis Date  . Anxiety   . Celiac sprue   . TMJ arthralgia     PAST SURGICAL HISTORY: Past Surgical History:  Procedure Laterality Date  . lymph node removal  2000    FAMILY HISTORY: Family History  Problem Relation Age of Onset  . Hypertension Mother   . Diabetes Mother   . Celiac disease Mother   . Coronary artery disease Maternal Grandfather   . Pulmonary fibrosis Father     SOCIAL HISTORY:  Social History   Social History  . Marital status: Married    Spouse name: Brett Canales  . Number of children: 2  . Years of education: 16   Occupational History  . Office manager [employer]   Social History Main Topics  . Smoking status: Former Smoker    Quit date: 06/28/1999  .  Smokeless tobacco: Never Used  . Alcohol use No     Comment: 1-2/year  . Drug use: No  . Sexual activity: Not on file   Other Topics Concern  . Not on file   Social History Narrative   Married 2 children   Right handed   BSBA   3-4 cups daily     PHYSICAL EXAM  GENERAL EXAM/CONSTITUTIONAL: Vitals:  Vitals:   06/27/16 1526  BP: 101/64  Pulse: 77  Weight: 117 lb 12.8 oz (53.4 kg)  Height: 5' 3.75" (1.619 m)      Body mass index is 20.38 kg/m.  Visual Acuity Screening   Right eye Left eye Both eyes  Without correction:     With correction: 20/30 20/30   Comments: 06/27/16 wears contacts    Patient is in no distress; well developed, nourished and groomed; neck is supple  CARDIOVASCULAR:  Examination of carotid arteries is normal; no carotid bruits  Regular rate and rhythm, no murmurs  Examination of peripheral vascular system by observation and palpation is normal  EYES:  Ophthalmoscopic exam of optic discs and posterior segments is normal; no papilledema or hemorrhages  MUSCULOSKELETAL:  Gait, strength, tone, movements noted in Neurologic exam below  NEUROLOGIC: MENTAL STATUS:  No flowsheet data found.  awake, alert, oriented to person, place and time  recent and remote memory intact  normal attention and concentration  language fluent, comprehension intact, naming intact,   fund of knowledge appropriate  CRANIAL NERVE:   2nd - no papilledema on fundoscopic exam  2nd, 3rd, 4th, 6th - pupils equal and reactive to light, visual fields full to confrontation, extraocular muscles intact, no nystagmus  5th - facial sensation symmetric  7th - facial strength symmetric  8th - hearing intact  9th - palate elevates symmetrically, uvula midline  11th - shoulder shrug symmetric  12th - tongue protrusion midline  MOTOR:   normal bulk and tone, full strength in the BUE, BLE  SENSORY:   normal and symmetric to light touch, temperature, vibration  COORDINATION:   finger-nose-finger, fine finger movements normal  REFLEXES:   deep tendon reflexes present and symmetric  GAIT/STATION:   narrow based gait; able to walk tandem; romberg is negative    DIAGNOSTIC DATA (LABS, IMAGING, TESTING) - I reviewed patient records, labs, notes, testing and imaging myself where available.  Lab Results  Component Value Date   WBC 5.9 10/12/2014   HGB 12.9 10/12/2014    HCT 39.6 10/12/2014   MCV 93.6 10/12/2014   PLT 243 10/12/2014      Component Value Date/Time   NA 140 10/12/2014 1447   K 3.5 10/12/2014 1447   CL 105 10/12/2014 1447   CO2 29 10/12/2014 1447   GLUCOSE 85 10/12/2014 1447   BUN 14 10/12/2014 1447   CREATININE 0.79 10/12/2014 1447   CALCIUM 8.8 10/12/2014 1447   PROT 6.7 10/12/2014 1500   ALBUMIN 4.1 10/12/2014 1500   AST 17 10/12/2014 1500   ALT 15 10/12/2014 1500   ALKPHOS 41 10/12/2014 1500   BILITOT 0.2 (L) 10/12/2014 1500   GFRNONAA >90 10/12/2014 1447   GFRAA >90 10/12/2014 1447   No results found for: CHOL, HDL, LDLCALC, LDLDIRECT, TRIG, CHOLHDL No results found for: ZOXW9UHGBA1C No results found for: VITAMINB12 No results found for: TSH   10/22/13 MRI brain - normal      ASSESSMENT AND PLAN  41 y.o. year old female here with history of migraine since middle school, migraine free  from 2003 until 2015, now with recurrent migraines in December 2017, in the setting of starting new medication Accutane. Migraines may be related to medication side effect. May consider slightly reducing dosage migraines improved. Will also tried migraine rescue medications. If reduction or cessation of Accutane medication does not improve migraine, but we may start migraine preventative medication.   Ddx: migraine headaches vs medication side effect  1. Migraine with aura and without status migrainosus, not intractable      PLAN: - may reduce accutane to 40mg  daily (to see if migraine improves) - rescue meds (cambia + rizatriptan) - may consider TPX or gabapentin in future (migraine prevention)  Meds ordered this encounter  Medications  . Diclofenac Potassium (CAMBIA) 50 MG PACK    Sig: Take 50 mg by mouth as needed (for migraine; max 1 packet per day).    Dispense:  9 each    Refill:  3  . rizatriptan (MAXALT-MLT) 10 MG disintegrating tablet    Sig: Take 1 tablet (10 mg total) by mouth as needed for migraine. May repeat in 2  hours if needed    Dispense:  9 tablet    Refill:  11   Return in about 3 months (around 09/25/2016).    Suanne Marker, MD 06/27/2016, 3:51 PM Certified in Neurology, Neurophysiology and Neuroimaging  [facility] [address] [place], [place] [address] [phone]

## 2016-06-27 NOTE — Patient Instructions (Addendum)
Thank you for coming to see [place] at [facility]. I hope we have been able to provide you high quality care today.  You may receive a patient satisfaction survey over the next few weeks. We would appreciate your feedback and comments so that we may continue to improve ourselves and the health of our patients.  - may reduce accutane to 43m daily (to see if migraine improves)  - rescue meds (cambia + rizatriptan): max 5-10 dose per month  - may consider topiramate or gabapentin in future (migraine prevention)   ~~~~~~~~~~~~~~~~~~~~~~~~~~~~~~~~~~~~~~~~~~~~~~~~~~~~~~~~~~~~~~~~~  DR. Delories Mauri'S GUIDE TO HAPPY AND HEALTHY LIVING These are some of my general health and wellness recommendations. Some of them may apply to you better than others. Please use common sense as you try these suggestions and feel free to ask me any questions.   ACTIVITY/FITNESS Mental, social, emotional and physical stimulation are very important for brain and body health. Try learning a new activity (arts, music, language, sports, games).  Keep moving your body to the best of your abilities. You can do this at home, inside or outside, the park, community center, gym or anywhere you like. Consider a physical therapist or personal trainer to get started. Consider the app Sworkit. Fitness trackers such as smart-watches, smart-phones or Fitbits can help as well.   NUTRITION Eat more plants: colorful vegetables, nuts, seeds and berries.  Eat less sugar, salt, preservatives and processed foods.  Avoid toxins such as cigarettes and alcohol.  Drink water when you are thirsty. Warm water with a slice of lemon is an excellent morning drink to start the day.  Consider these websites for more information The Nutrition Source (hhttps://www.henry-hernandez.biz/ Precision Nutrition (wWindowBlog.ch   RELAXATION Consider practicing mindfulness meditation or other  relaxation techniques such as deep breathing, prayer, yoga, tai chi, massage. See website mindful.org or the apps Headspace or Calm to help get started.   SLEEP Try to get at least 7-8+ hours sleep per day. Regular exercise and reduced caffeine will help you sleep better. Practice good sleep hygeine techniques. See website sleep.org for more information.   PLANNING Prepare estate planning, living will, healthcare POA documents. Sometimes this is best planned with the help of an attorney. Theconversationproject.org and agingwithdignity.org are excellent resources.

## 2016-06-28 ENCOUNTER — Telehealth: Payer: Self-pay | Admitting: *Deleted

## 2016-06-28 MED ORDER — DICLOFENAC POTASSIUM 50 MG PO TABS: 50.0000 mg | ORAL_TABLET | Freq: Every day | ORAL | 3 refills | Status: DC | PRN

## 2016-06-28 NOTE — Telephone Encounter (Addendum)
Have attempted to call [employer]  x 2 to begin PA for Cambia. Get recording that "due to increased call volume, they are unable to assist me at this time".  Submitted PA request on Cover My Meds.

## 2016-06-28 NOTE — Telephone Encounter (Signed)
I sent in rx for diclofenac tabs.  Also let patient know that topiramate does not interact with mirena IUD (correction from what I said before). Topiramate does interact with other hormonal contraceptives at high dosese (>200mg  per day). Therefore, topiramate is an option for her at some point for migraine prevention.   Suanne MarkerVIKRAM R. Sukanya Goldblatt, MD 06/28/2016, 4:33 PM Certified in Neurology, Neurophysiology and Neuroimaging  [facility] [address], [address] [place], [place] [address] [phone]

## 2016-06-28 NOTE — Telephone Encounter (Signed)
At approximately 4:20 pm patient came into office asking for a migraine infusion. She stated she had tried to reach the office since 3 pm. Informed her the infusion RN had most likely left for the day.  This RN went to check.  Found that Mindy RN was here but getting ready to leave. She stated pt could come tomorrow between 7:30- 9:30 am.  This RN went back to waiting room to speak with patient and informed her she may come in the morning between 7:30 am- 9:30 am for an infusion. She stated she was unsure if she could come in. Advised her that Dr Marjory LiesPenumalli sent in new prescription because Verlin FesterCambia was not approved, and advised she pick it up. Advised she take her migraine medications tonight.  Patient verbalized understanding, left the office. At return to desk, this RN saw Dr Richrd HumblesPenumalli's note from 4:31 pm. LVM for patient giving her Dr Richrd HumblesPenumalli's message below. Requested she call as soon as she can in the morning if she would like to come in for an infusion. Gave her phone number and also advised her that using My Chart to send a message is often a better means of communication.

## 2016-06-29 ENCOUNTER — Telehealth: Payer: Self-pay | Admitting: *Deleted

## 2016-06-29 ENCOUNTER — Telehealth: Payer: Self-pay | Admitting: Diagnostic Neuroimaging

## 2016-06-29 MED ORDER — PREDNISONE 10 MG PO TABS: ORAL_TABLET | ORAL | 0 refills | Status: DC

## 2016-06-29 NOTE — Telephone Encounter (Signed)
Called patient to follow up on her unscheduled visit to office yesterday.  No answer.  Left voice mail expressing this RN hoped she was feeling better, and advised she should take migraine medications as prescribed and needed. Advised if she still feels badly on Monday to call early for infusion. Informed her that the office is now closed, but there is a dr on call on weekends should she need the service. Left office number.

## 2016-06-29 NOTE — Telephone Encounter (Signed)
Pt continues with daily headaches. Will send in prednisone dose pack. -VRP

## 2016-07-01 DIAGNOSIS — G43909 Migraine, unspecified, not intractable, without status migrainosus: Secondary | ICD-10-CM | POA: Diagnosis not present

## 2016-07-06 ENCOUNTER — Telehealth: Payer: Self-pay | Admitting: Diagnostic Neuroimaging

## 2016-07-06 MED ORDER — GABAPENTIN 300 MG PO CAPS: ORAL_CAPSULE | ORAL | 2 refills | Status: DC

## 2016-07-06 MED ORDER — PREDNISONE 5 MG PO TABS: ORAL_TABLET | ORAL | 0 refills | Status: DC

## 2016-07-06 NOTE — Telephone Encounter (Signed)
Patient has called in reference to constant headaches wanting to know if she is able to do another round of steroids.  Patient is taking advil every 6 hrs. Please call

## 2016-07-06 NOTE — Addendum Note (Signed)
Addended by: Stephanie AcreWILLIS, Seher Schlagel on: 07/06/2016 09:31 AM   Modules accepted: Orders

## 2016-07-06 NOTE — Telephone Encounter (Signed)
I called the patient, her headaches remain relatively frequent. Prednisone Dosepak previously did help, I will: A 5 mg 6 day pack, initiate gabapentin as per Dr. Pattricia BossPenumali's previous note.

## 2016-07-06 NOTE — Telephone Encounter (Signed)
Dr. Marjory LiesPenumalli pt seen for OV on 06/27/16, discharged w/ followingPLAN: - may reduce accutane to 40mg  daily (to see if migraine improves) - rescue meds (cambia + rizatriptan) - may consider TPX or gabapentin in future (migraine prevention)  However, Cambia PA was not approved so new rx for diclofenac tabs sent in 06/28/16. Infusion and prednisone dose pack ordered 06/29/16.

## 2016-07-29 ENCOUNTER — Other Ambulatory Visit: Payer: Self-pay | Admitting: Diagnostic Neuroimaging

## 2016-08-29 ENCOUNTER — Encounter: Payer: Self-pay | Admitting: Diagnostic Neuroimaging

## 2016-09-25 ENCOUNTER — Ambulatory Visit: Payer: BLUE CROSS/BLUE SHIELD | Admitting: Diagnostic Neuroimaging

## 2016-09-27 DIAGNOSIS — L700 Acne vulgaris: Secondary | ICD-10-CM | POA: Diagnosis not present

## 2016-10-08 ENCOUNTER — Encounter: Payer: Self-pay | Admitting: Diagnostic Neuroimaging

## 2016-10-15 ENCOUNTER — Encounter: Payer: Self-pay | Admitting: Nurse Practitioner

## 2016-10-15 ENCOUNTER — Ambulatory Visit (INDEPENDENT_AMBULATORY_CARE_PROVIDER_SITE_OTHER): Payer: BLUE CROSS/BLUE SHIELD | Admitting: Nurse Practitioner

## 2016-10-15 VITALS — BP 91/60 | HR 72 | Wt 115.2 lb

## 2016-10-15 DIAGNOSIS — G43109 Migraine with aura, not intractable, without status migrainosus: Secondary | ICD-10-CM | POA: Diagnosis not present

## 2016-10-15 DIAGNOSIS — G4440 Drug-induced headache, not elsewhere classified, not intractable: Secondary | ICD-10-CM

## 2016-10-15 MED ORDER — PREDNISONE 10 MG PO TABS: ORAL_TABLET | ORAL | 0 refills | Status: DC

## 2016-10-15 MED ORDER — TOPIRAMATE 25 MG PO TABS: 25.0000 mg | ORAL_TABLET | Freq: Every day | ORAL | 2 refills | Status: DC

## 2016-10-15 NOTE — Patient Instructions (Signed)
Stop Gabapentin Try Topamax 25 mg at night for 1 week then increase to 2 tabs at night  6 day Prednisone dose pack take as directed Keep a record of headaches  Follow up in 6 weeks

## 2016-10-15 NOTE — Progress Notes (Signed)
[facility]  PATIENT: [name] DOB: 02-May-1976   REASON FOR VISIT: Follow-up for continuous headache HISTORY FROM: Patient    HISTORY OF PRESENT ILLNESS:UPDATE 04/23/2018CM [name], 41 year old female returns for follow-up for migraine/headaches throughout her life. She claims her current headache is not a migraine is just a constant headache on the left side moderately severe in  intensity it waxes and wanes but it never disappears completely.Starts around the left eye.  On a pain scale of 0-10, her headache today is a 7-8. She took ibuprofen this morning. She also takes Excedrin Migraine When it is severe she feels like it is an ice pick sensation she has  Phonophobia,and  photophobia . No  nausea. . She says her Maxalt has little effect. She has had migraine cocktail in the past with little effect. She does say prednisone helped her headache in January . She claims the gabapentin has not been effective this makes her drowsy and hungover. She has stopped her accutane  altogether but this has made little difference . She returns for reevaluation   HISTORY 06/27/16 VP89 year old female here for alteration of migraine headaches. Patient has had migraine headaches since middle school. She describes severe unilateral, throbbing painful headaches with nausea, vomiting, photophobia. Sometimes these are preceded by sparkling visual aura and scintillating scotoma. Patient has had headaches throughout her life, intermittently, has seen headache wellness center in the past, tried some rescue medicines such as Imitrex and Cambia with mild relief.  During pregnancy 2003, headaches resolved. She had no further headaches until 2015. At that time she had some complicated migraine symptoms (vision loss, left sided numbness) and was seen by Dr. Hosie Poisson.   Since then patient's symptoms were under control until 05/26/2016. Patient had severe migraine headache attack. In 06/24/2016  patient had another migraine attack while on a ski trip with family. Patient had additional migraine attack on 06/26/16. No specific triggering or aggravating factors. However patient has started medication Accutane November 2017, and she is concerned that this may have aggravated her migraines. Patient was on this medication at age 9, but does not recall aggravation of migraine at that time.  In addition patient has low-level daily nagging headaches that she wakes up with every day. She uses ibuprofen, Excedrin Migraine, Tylenol as needed. She does not use these on a daily basis.  REVIEW OF SYSTEMS: Full 14 system review of systems performed and notable only for those listed, all others are neg:  Constitutional: neg  Cardiovascular: neg Ear/Nose/Throat: neg  Skin: neg Eyes: Light sensitivity Respiratory: neg Gastroitestinal: neg  Hematology/Lymphatic: neg  Endocrine: neg Musculoskeletal:neg Allergy/Immunology: neg Neurological: Headache Psychiatric: neg Sleep : neg   ALLERGIES: No Known Allergies  HOME MEDICATIONS: Outpatient Medications Prior to Visit  Medication Sig Dispense Refill  . ALPRAZolam (XANAX) 0.25 MG tablet 0.25 mg as needed.  0  . aspirin-acetaminophen-caffeine (EXCEDRIN MIGRAINE) 250-250-65 MG tablet Take by mouth every 6 (six) hours as needed for headache.    . diclofenac (CATAFLAM) 50 MG tablet Take 1 tablet (50 mg total) by mouth daily as needed (migraine). 10 tablet 3  . Diclofenac Potassium (CAMBIA) 50 MG PACK Take 50 mg by mouth as needed (for migraine; max 1 packet per day). 9 each 3  . escitalopram (LEXAPRO) 20 MG tablet 20 mg daily.  2  . gabapentin (NEURONTIN) 300 MG capsule One capsule night for 5 days then take 1 capsule twice daily 60 capsule 2  . hyoscyamine (LEVSIN SL) 0.125 MG  SL tablet DISSOLVE 1 TABLET UNDER THE TONGUE EVERY 4 HOURS AS NEEDED FOR ABDOMINAL CRAMPING 60 tablet 3  . levonorgestrel (MIRENA) 20 MCG/24HR IUD 1 each by Intrauterine route  once.    . Multiple Vitamin (MULTIVITAMIN) tablet Take 1 tablet by mouth daily.    Marland Kitchen Plecanatide (TRULANCE) 3 MG TABS Take 3 mg by mouth daily.    . rizatriptan (MAXALT-MLT) 10 MG disintegrating tablet Take 1 tablet (10 mg total) by mouth as needed for migraine. May repeat in 2 hours if needed 9 tablet 11  . anti-nausea (EMETROL) solution Take 10 mLs by mouth every 15 (fifteen) minutes as needed for nausea or vomiting.    Marland Kitchen MYORISAN 40 MG capsule 40 mg 2 (two) times daily.  0  . predniSONE (DELTASONE) 5 MG tablet Begin taking 6 tablets daily, taper by one tablet daily until off the medication. 21 tablet 0   No facility-administered medications prior to visit.     PAST MEDICAL HISTORY: Past Medical History:  Diagnosis Date  . Anxiety   . Celiac sprue   . Headache    migraines  . TMJ arthralgia     PAST SURGICAL HISTORY: Past Surgical History:  Procedure Laterality Date  . lymph node removal  2000    FAMILY HISTORY: Family History  Problem Relation Age of Onset  . Hypertension Mother   . Diabetes Mother   . Celiac disease Mother   . Coronary artery disease Maternal Grandfather   . Pulmonary fibrosis Father     SOCIAL HISTORY: Social History   Social History  . Marital status: Married    Spouse name: Brett Canales  . Number of children: 2  . Years of education: 16   Occupational History  . Office manager [employer]   Social History Main Topics  . Smoking status: Former Smoker    Quit date: 06/28/1999  . Smokeless tobacco: Never Used  . Alcohol use No     Comment: 1-2/year  . Drug use: No  . Sexual activity: Not on file   Other Topics Concern  . Not on file   Social History Narrative   Married 2 children   Right handed   BSBA   3-4 cups daily     PHYSICAL EXAM  Vitals:   10/15/16 1354  BP: 91/60  Pulse: 72  Weight: 115 lb 3.2 oz (52.3 kg)   Body mass index is 19.93 kg/m.  Generalized: Well developed, in no acute distress  Head:  normocephalic and atraumatic,. Oropharynx benign  Neck: Supple, no carotid bruits  Cardiac: Regular rate rhythm, no murmur  Musculoskeletal: No deformity   Neurological examination   Mentation: Alert oriented to time, place, history taking. Attention span and concentration appropriate. Recent and remote memory intact.  Follows all commands speech and language fluent.   Cranial nerve II-XII: Fundoscopic exam reveals sharp disc margins.Pupils were equal round reactive to light extraocular movements were full, visual field were full on confrontational test. Facial sensation and strength were normal. hearing was intact to finger rubbing bilaterally. Uvula tongue midline. head turning and shoulder shrug were normal and symmetric.Tongue protrusion into cheek strength was normal. Motor: normal bulk and tone, full strength in the BUE, BLE, fine finger movements normal, no pronator drift. No focal weakness Sensory: normal and symmetric to light touch, pinprick, and  Vibration, in the upper and lower extremities  Coordination: finger-nose-finger, heel-to-shin bilaterally, no dysmetria Reflexes: Brachioradialis 2/2, biceps 2/2, triceps 2/2, patellar 2/2, Achilles 2/2, plantar responses were  flexor bilaterally. Gait and Station: Rising up from seated position without assistance, normal stance,  moderate stride, good arm swing, smooth turning, able to perform tiptoe, and heel walking without difficulty. Tandem gait is steady  DIAGNOSTIC DATA (LABS, IMAGING, TESTING) - ASSESSMENT AND PLAN  41 y.o. year old female  has a past medical history of headaches since high school, migraine free for 2003-2015 has had recurrent  Headaches in the setting of Accutane which was a new medication however she has stopped the Accutane and the headaches have persisted.  She has constant headache on the left side moderately severe in  intensity it waxes and wanes but it never disappears completely.Starts around the left eye.  On  a pain scale of 0-10, her headache today is a 7-8. This headache has been going on for several weeks  PLAN: Stop Gabapentin Try Topamax 25 mg at night for 1 week then increase to 2 tabs at night  6 day Prednisone dose pack take as directed Keep a record of headaches and bring to next visit Stop over-the-counter ibuprofen and Excedrin Migraine. Given information on rebound headaches Follow up in 6 weeks Greater than 50% of time during this 25 minute visit was spent on counseling,explanation of diagnosis, planning of further management, discussion with patient  and coordination of care answering multiple questions. Cline Crock, [facility], APRN  [facility] [address], [address] [place], [place] [address] [phone]

## 2016-10-16 ENCOUNTER — Telehealth: Payer: Self-pay | Admitting: Nurse Practitioner

## 2016-10-16 NOTE — Telephone Encounter (Signed)
Patient called office in reference to predniSONE (DELTASONE) 10 MG tablet.  Patient states at her visit yesterday it was mentioned that the steroid was going to be a higher dosage than given before.  Patient didn't sleep but pick up the prednisone starting the dosage.  Per patient this is the same medication as prescribed before.  Please call

## 2016-10-16 NOTE — Telephone Encounter (Signed)
Spoke with patient and informed her that Eber Jones has given her highest dose of prednisone. Advised she needs to give it time as well as topamax getting into her system. Advised her to continue with all measures she knows help relieve her headaches. She stated she had difficulty sleeping last night; advised she take Benadryl to help her sleep. She verbalized understanding,a appreciation.

## 2016-10-16 NOTE — Telephone Encounter (Signed)
According to Dr. Marjory Lies phone note he ordered the Prednisone  5 mg dose not 10.  is what I ordered twice the dose. This will not be immediate relief.

## 2016-10-16 NOTE — Telephone Encounter (Signed)
Spoke with patient and informed her that per Carolyn's office note, the specific dose of prednisone wasn't mentioned. Patient stated that NP made the comment as she was leaving the exam room that she was ordering stronger dose than Dr Marjory Lies had given her. Patient stated it is the same dose as previously prescribed. Patient stated she has started taking Prednisone as directed; she also started topamax last night as directed. She is asking if the dose of Prednisone is correct and if "this is all that can be done, or is it too early to tell"? This RN stated that the intention of the course of steroid and starting Topamax is to break the headache cycle. This RN stated will send her questions to [facility] and call her back later this afternoon. Patient verbalized understanding, appreciation.

## 2016-10-17 ENCOUNTER — Telehealth: Payer: Self-pay | Admitting: *Deleted

## 2016-10-17 DIAGNOSIS — R51 Headache: Secondary | ICD-10-CM | POA: Diagnosis not present

## 2016-10-17 DIAGNOSIS — G43109 Migraine with aura, not intractable, without status migrainosus: Secondary | ICD-10-CM

## 2016-10-17 NOTE — Telephone Encounter (Signed)
Received call from patient stating that she is having tingling of her body. She also stated that she has been taking Prednisone as ordered, but her headache is worse. She stated it is now "all over her head and behind her eyes".  This RN advised the tingling is most likely due to starting Toapmax because it is a common side effect when beginning the medication.  She is asking what can be done about her worsening headache. This RN stated will route her question to NP and call her back.  Patient verbalized understanding, appreciation.

## 2016-10-17 NOTE — Telephone Encounter (Signed)
Called patient and informed her of Dr Richrd Humbles recommendations. Advised she stop Prednisone if she felt it was making her headache worse. Advised her the Topamax can cause tingling but to continue the medication.  Inquired if she is having eye issues; she denied. Advised if she is to call her eye dr.   Algis Downs she will get a call when the MRI has been approved through her insurance.  Patient stated she is not getting any relief from headache behind her eyes.  This RN inquired if she is taking Rizatriptan. She stated she was told not to take anything else. This RN advised she was told not to take OTC such as Ibuprofen, Excedrin Migraine.  Patient inquired if she could come into office for medication. This RN inquired if she has gotten migraine infusion in past. Patient stated she went to Urgent Care in the past and received Toradol which was not helpful. This RN stated she would discuss possible infusion with Dr Marjory Lies and if he approved, would check with infusion RN for availability tomorrow.  Advised she take Rizatriptan as needed.  Patient verbalized understanding, agreement.  Discussed with Dr Marjory Lies. He stated if availability tomorrow, patient may come in for Depacon, Compazine, Toradol infusion. Tina, infusion RN has left for the day. Will call patient to inform.  Spoke with patient and informed. She understands that infusion will depend on available appointment and that she must have a driver if she receives Compazine. She verbalized understanding, agreement, appreciation.

## 2016-10-17 NOTE — Telephone Encounter (Signed)
Discussed with Dr. Levada Dy. If the Prednisone is making the head ache worse   stop the med. She had benefit in the past. Topamax can cause tingling. Stay on med. Will order MRI of the brain. If having visual problems need to see eye MD

## 2016-10-18 ENCOUNTER — Telehealth: Payer: Self-pay | Admitting: *Deleted

## 2016-10-18 DIAGNOSIS — G43101 Migraine with aura, not intractable, with status migrainosus: Secondary | ICD-10-CM | POA: Diagnosis not present

## 2016-10-18 NOTE — Telephone Encounter (Signed)
Spoke with Inetta Fermo who stated patient can come at 1:30 pm for infusion today. Called patient and informed her. She stated she went to her PCP yesterday "just to be sure she didn't have something else going on". She stated he checked her ears, nose, throat, and found no signs of infection, illness. She continues to have pressure behind her eyes, in her temples. She took Rizatriptan last night with fair relief. Her symptoms are unchanged this morning, and she is nauseated. Advised she stay well hydrated today, eat a bland diet if still nauseated and arrive at 1:30 for infusion. She understands she must have a driver. She verbalized understanding, agreement, appreciation of call.

## 2016-10-18 NOTE — Telephone Encounter (Signed)
Called husband to check on patient's status. He stated she was asleep. He stated that she may have gotten some relief as they went home, but he wasn't sure. He inquired what to do if she wasn't any better tomorrow. This RN advised she continue with prednisone medication and other medications as ordered.  Advised if she is no better to call before 12 noon as office closes tat noon on Fridays. He stated he would tell her. He verbalized understanding, appreciation of call.

## 2016-10-18 NOTE — Telephone Encounter (Signed)
Spoke with patient prior to migraine infusion.  She stated she has had no relief from headache, migraine. Following infusion, Mindy RN spoke with Dr Marjory Lies. Stated patient has not gotten any relief from infusion. Per Dr Marjory Lies patient to continue with current medications. Spoke with Irving Burton re: see if MRI can be expedited.  Following completion of infusion, this RN took patient and husband to talk wit Irving Burton. Will check on pt later this afternoon.

## 2016-10-19 ENCOUNTER — Telehealth: Payer: Self-pay | Admitting: *Deleted

## 2016-10-19 NOTE — Telephone Encounter (Signed)
Patient called in to update her status, clarify medication regime. She stated her headache is not better following migraine infusion yesterday afternoon. Advised she continue with steroid taper, and on Sunday increase Topamax to two tabs nightly per NP instructions. Advised she may take Rizatriptan as needed, but no more than two in 24 hr period or total of 8 in one month. Advised she has refills on Rizatriptan.  Informed her that office has dr on call this weekend for urgent needs or changes/worsening of her headache. She verbalized understanding, agreement. Enid Skeens, NP aware and agreed with this conversation.

## 2016-10-22 ENCOUNTER — Ambulatory Visit
Admission: RE | Admit: 2016-10-22 | Discharge: 2016-10-22 | Disposition: A | Discharge status: Home / Self Care | Payer: BLUE CROSS/BLUE SHIELD | Source: Ambulatory Visit | Attending: Diagnostic Neuroimaging | Admitting: Diagnostic Neuroimaging

## 2016-10-22 ENCOUNTER — Telehealth: Payer: Self-pay | Admitting: Diagnostic Neuroimaging

## 2016-10-22 ENCOUNTER — Other Ambulatory Visit: Payer: Self-pay | Admitting: Diagnostic Neuroimaging

## 2016-10-22 DIAGNOSIS — R519 Headache, unspecified: Secondary | ICD-10-CM

## 2016-10-22 DIAGNOSIS — R51 Headache: Secondary | ICD-10-CM | POA: Diagnosis not present

## 2016-10-22 MED ORDER — GADOBENATE DIMEGLUMINE 529 MG/ML IV SOLN
10.0000 mL | Freq: Once | INTRAVENOUS | Status: AC | PRN
  Administered 2016-10-22: 10 mL via INTRAVENOUS

## 2016-10-22 NOTE — Telephone Encounter (Signed)
Pt says HA is still constant, she is wanting to know ir she could have a refill of prednisone. She also has questions about the MRI. She can be reached at [phone]

## 2016-10-22 NOTE — Telephone Encounter (Signed)
I spoke with Elease Hashimoto at GI and she stated that she had been trying to call her and she hasn't picked up so I called the patient and she picked up and I informed her that Dr. Marjory Lies wanted her to have the MRI a little sooner than Wednesday she was confused on why he wanted to have a sooner appointment than Wednesday.. I told her that Elease Hashimoto at GI does have an opening today at 1:15 but she has been trying to get a hold of her to ask her a few screening questions before they can actually scheduled it because they have just been holding it and they can only hold it but for so long.. She then informed me that she is on the last day of the Prednisone and didn't know she was going to do another round of that or is this it?

## 2016-10-22 NOTE — Progress Notes (Signed)
I reviewed note and agree with plan.   VIKRAM R. PENUMALLI, MD  Certified in Neurology, Neurophysiology and Neuroimaging  [facility] [address], [address] [place], [place] [address] [phone]   

## 2016-10-22 NOTE — Telephone Encounter (Signed)
Spoke with patient and informed her that Dr Marjory Lies does not plan to reorder prednisone but wait for MRI results. Advised her that is why he moved MRI to sooner appointment.   Patient stated she is scheduled for MRI this afternoon.  Advised she will get a call when MRI results are available a few days. She questioned if MRI will show if she has a rebound headache. This RN advised MRI will not show presence of a headache but will show any other possible causes for her constant headache. Advised she continue with all medications as ordered. She stated she will finish prednisone today; she began taking Topamax 2 tabs last night. She verbalized understanding, appreciation of call.

## 2016-10-24 ENCOUNTER — Other Ambulatory Visit: Payer: BLUE CROSS/BLUE SHIELD

## 2016-10-24 ENCOUNTER — Telehealth: Payer: Self-pay | Admitting: *Deleted

## 2016-10-24 MED ORDER — PREDNISONE 10 MG PO TABS: ORAL_TABLET | ORAL | 0 refills | Status: DC

## 2016-10-24 NOTE — Telephone Encounter (Signed)
Pt called said since increasing topamax to tabs 4/29 she is foggy headed, constant tingling in the face, cheeks, lips and occ fingers, flush all the time. Is there an alternative medication.

## 2016-10-24 NOTE — Telephone Encounter (Signed)
Patient contacted office earlier today; stated since increasing Topamax to 50 mg at night she has had more numbness, tingling.  However her headache has not gone away. Discussed with Dr Marjory Lies who advised she may increase Topamax to 50 mg day and give it 2-4 weeks for full benefits and for side effects to resolve. She may try migraine infusion and/or prednisone dose pack.  Patient called in before this RN could make call. She stated she really wants to stop Topamax. This RN explained that she may have to try and stay on it x 2-4 weeks. Advised Dr Marjory Lies will order infusion and prednisone dose pack if she wants to try those. Patient stated the infusion did not help at all. She stated she is going to reduce Topamax to 25 mg at night and wants dose pack ordered. Dr Marjory Lies advised; verbal order for dose pack received and reorder placed.

## 2016-10-25 NOTE — Telephone Encounter (Signed)
See phone note from 10/24/16.

## 2016-11-20 DIAGNOSIS — F411 Generalized anxiety disorder: Secondary | ICD-10-CM | POA: Diagnosis not present

## 2016-11-20 DIAGNOSIS — J029 Acute pharyngitis, unspecified: Secondary | ICD-10-CM | POA: Diagnosis not present

## 2016-11-20 DIAGNOSIS — M542 Cervicalgia: Secondary | ICD-10-CM | POA: Diagnosis not present

## 2016-11-23 ENCOUNTER — Encounter: Payer: Self-pay | Admitting: Diagnostic Neuroimaging

## 2016-11-23 ENCOUNTER — Ambulatory Visit (INDEPENDENT_AMBULATORY_CARE_PROVIDER_SITE_OTHER): Payer: BLUE CROSS/BLUE SHIELD | Admitting: Diagnostic Neuroimaging

## 2016-11-23 VITALS — BP 95/53 | HR 91 | Wt 114.4 lb

## 2016-11-23 DIAGNOSIS — M5481 Occipital neuralgia: Secondary | ICD-10-CM

## 2016-11-23 DIAGNOSIS — G43109 Migraine with aura, not intractable, without status migrainosus: Secondary | ICD-10-CM

## 2016-11-23 MED ORDER — PREDNISONE 10 MG PO TABS: ORAL_TABLET | ORAL | 0 refills | Status: DC

## 2016-11-23 MED ORDER — AMITRIPTYLINE HCL 25 MG PO TABS: 25.0000 mg | ORAL_TABLET | Freq: Every day | ORAL | 6 refills | Status: DC

## 2016-11-23 NOTE — Patient Instructions (Signed)
-   stop gabapentin 300mg  at bedtime  - start amitriptyline 25mg  at bedtime

## 2016-11-23 NOTE — Progress Notes (Signed)
[facility]  PATIENT: [name] DOB: 10-02-1975  REFERRING CLINICIAN: A Morrow HISTORY FROM: patient and husband  REASON FOR VISIT: existing patient    HISTORICAL  CHIEF COMPLAINT:  Chief Complaint  Patient presents with  . Migraine    rm 7, "headache is always there- occipital area; scalp very sensitive; went [facility]- trial of Mobic, Lidocaine injections in shoulders were helpful; accupuncture x 2- helpful; decreased caffeine, increased water, exercising, doing yoga'  . Follow-up    6 week    HISTORY OF PRESENT ILLNESS:   UPDATE 11/23/16 (VP): Since last visit Patient continues to have combination of symptoms including left occipital pain and sensitivity, intermittent migratory scalp sensations, muscle cramp and tension in her shoulders, daily headaches, decreased sleep. Patient tried Topamax but this caused fogginess and drowsiness. Patient has tried prednisone Pak 2, and second time helped with some of her symptoms. Rizatriptan does not seem to help. Patient has tried meloxicam for the past one week with mild benefit. Patient has tried to improve her hydration, exercise, relaxation techniques. Patient went to [facility] pain management clinic, tried trigger point injections in his shoulders without benefit. She tried acupuncture 2 with some benefit.  UPDATE 10/15/16 (CM): 41 year old female returns for follow-up for migraine/headaches throughout her life. She claims her current headache is not a migraine is just a constant headache on the left side moderately severe in  intensity it waxes and wanes but it never disappears completely.Starts around the left eye.  On a pain scale of 0-10, her headache today is a 7-8. She took ibuprofen this morning. She also takes Excedrin Migraine When it is severe she feels like it is an ice pick sensation she has  Phonophobia,and  photophobia . No  nausea. . She says her Maxalt has little effect. She has had  migraine cocktail in the past with little effect. She does say prednisone helped her headache in January . She claims the gabapentin has not been effective this makes her drowsy and hungover. She has stopped her accutane  altogether but this has made little difference . She returns for reevaluation  PRIOR HPI (06/27/16): 41 year old female here for evaluation of migraine headaches. Patient has had migraine headaches since middle school. She describes severe unilateral, throbbing painful headaches with nausea, vomiting, photophobia. Sometimes these are preceded by sparkling visual aura and scintillating scotoma. Patient has had headaches throughout her life, intermittently, has seen headache wellness center in the past, tried some rescue medicines such as Imitrex and Cambia with mild relief.  During pregnancy 2003, headaches resolved. She had no further headaches until 2015. At that time she had some complicated migraine symptoms (vision loss, left sided numbness) and was seen by Dr. Hosie Poisson.   Since then patient's symptoms were under control until 05/26/2016. Patient had severe migraine headache attack. In 06/24/2016 patient had another migraine attack while on a ski trip with family. Patient had additional migraine attack on 06/26/16. No specific triggering or aggravating factors. However patient has started medication Accutane November 2017, and she is concerned that this may have aggravated her migraines. Patient was on this medication at age 51, but does not recall aggravation of migraine at that time.  In addition patient has low-level daily nagging headaches that she wakes up with every day. She uses ibuprofen, Excedrin Migraine, Tylenol as needed. She does not use these on a daily basis.    REVIEW OF SYSTEMS: Full 14 system review of systems performed and  negative with exception of: headaches.  ALLERGIES: Allergies  Allergen Reactions  . Topamax [Topiramate] Other (See Comments)    Couldn't  function    HOME MEDICATIONS: Outpatient Medications Prior to Visit  Medication Sig Dispense Refill  . ALPRAZolam (XANAX) 0.25 MG tablet 0.25 mg as needed.  0  . escitalopram (LEXAPRO) 20 MG tablet 20 mg daily.  2  . hyoscyamine (LEVSIN SL) 0.125 MG SL tablet DISSOLVE 1 TABLET UNDER THE TONGUE EVERY 4 HOURS AS NEEDED FOR ABDOMINAL CRAMPING 60 tablet 3  . levonorgestrel (MIRENA) 20 MCG/24HR IUD 1 each by Intrauterine route once.    . Multiple Vitamin (MULTIVITAMIN) tablet Take 1 tablet by mouth daily.    Marland Kitchen. Plecanatide (TRULANCE) 3 MG TABS Take 3 mg by mouth daily.    . rizatriptan (MAXALT-MLT) 10 MG disintegrating tablet Take 1 tablet (10 mg total) by mouth as needed for migraine. May repeat in 2 hours if needed 9 tablet 11  . diclofenac (CATAFLAM) 50 MG tablet Take 1 tablet (50 mg total) by mouth daily as needed (migraine). (Patient not taking: Reported on 11/23/2016) 10 tablet 3  . Diclofenac Potassium (CAMBIA) 50 MG PACK Take 50 mg by mouth as needed (for migraine; max 1 packet per day). (Patient not taking: Reported on 11/23/2016) 9 each 3  . anti-nausea (EMETROL) solution Take 10 mLs by mouth every 15 (fifteen) minutes as needed for nausea or vomiting.    Marland Kitchen. aspirin-acetaminophen-caffeine (EXCEDRIN MIGRAINE) 250-250-65 MG tablet Take by mouth every 6 (six) hours as needed for headache.    . ibuprofen (ADVIL,MOTRIN) 200 MG tablet Take 200 mg by mouth every 6 (six) hours as needed.    Marland Kitchen. MYORISAN 40 MG capsule 40 mg 2 (two) times daily.  0  . predniSONE (DELTASONE) 10 MG tablet 6 day dose pack take as directed 21 tablet 0  . topiramate (TOPAMAX) 25 MG tablet Take 1 tablet (25 mg total) by mouth daily. 1 po at bedtime for 1 week then increase to 2 tabs at bedtime 60 tablet 2   No facility-administered medications prior to visit.     PAST MEDICAL HISTORY: Past Medical History:  Diagnosis Date  . Anxiety   . Celiac sprue   . Headache    migraines  . TMJ arthralgia     PAST SURGICAL  HISTORY: Past Surgical History:  Procedure Laterality Date  . lymph node removal  2000    FAMILY HISTORY: Family History  Problem Relation Age of Onset  . Hypertension Mother   . Diabetes Mother   . Celiac disease Mother   . Coronary artery disease Maternal Grandfather   . Pulmonary fibrosis Father     SOCIAL HISTORY:  Social History   Social History  . Marital status: Married    Spouse name: Brett CanalesSteve  . Number of children: 2  . Years of education: 16   Occupational History  . Office manager [employer]   Social History Main Topics  . Smoking status: Former Smoker    Quit date: 06/28/1999  . Smokeless tobacco: Never Used  . Alcohol use No     Comment: 1-2/year  . Drug use: No  . Sexual activity: Not on file   Other Topics Concern  . Not on file   Social History Narrative   Married 2 children   Right handed   BSBA   3-4 cups daily, 11/23/16 decreased by 2/3      PHYSICAL EXAM  GENERAL EXAM/CONSTITUTIONAL: Vitals:  Vitals:  11/23/16 0902  BP: (!) 95/53  Pulse: 91  Weight: 114 lb 6.4 oz (51.9 kg)   Body mass index is 19.79 kg/m. No exam data present  Patient is in no distress; well developed, nourished and groomed; neck is supple  CARDIOVASCULAR:  Examination of carotid arteries is normal; no carotid bruits  Regular rate and rhythm, no murmurs  Examination of peripheral vascular system by observation and palpation is normal  EYES:  Ophthalmoscopic exam of optic discs and posterior segments is normal; no papilledema or hemorrhages  MUSCULOSKELETAL:  Gait, strength, tone, movements noted in Neurologic exam below  NEUROLOGIC: MENTAL STATUS:  No flowsheet data found.  awake, alert, oriented to person, place and time  recent and remote memory intact  normal attention and concentration  language fluent, comprehension intact, naming intact,   fund of knowledge appropriate  CRANIAL NERVE:   2nd - no papilledema on fundoscopic  exam  2nd, 3rd, 4th, 6th - pupils equal and reactive to light, visual fields full to confrontation, extraocular muscles intact, no nystagmus  5th - facial sensation symmetric  7th - facial strength symmetric  8th - hearing intact  9th - palate elevates symmetrically, uvula midline  11th - shoulder shrug symmetric  12th - tongue protrusion midline  MOTOR:   normal bulk and tone, full strength in the BUE, BLE  SENSORY:   normal and symmetric to light touch, temperature, vibration  COORDINATION:   finger-nose-finger, fine finger movements normal  REFLEXES:   deep tendon reflexes present and symmetric  GAIT/STATION:   narrow based gait; able to walk tandem    DIAGNOSTIC DATA (LABS, IMAGING, TESTING) - I reviewed patient records, labs, notes, testing and imaging myself where available.  Lab Results  Component Value Date   WBC 5.9 10/12/2014   HGB 12.9 10/12/2014   HCT 39.6 10/12/2014   MCV 93.6 10/12/2014   PLT 243 10/12/2014      Component Value Date/Time   NA 140 10/12/2014 1447   K 3.5 10/12/2014 1447   CL 105 10/12/2014 1447   CO2 29 10/12/2014 1447   GLUCOSE 85 10/12/2014 1447   BUN 14 10/12/2014 1447   CREATININE 0.79 10/12/2014 1447   CALCIUM 8.8 10/12/2014 1447   PROT 6.7 10/12/2014 1500   ALBUMIN 4.1 10/12/2014 1500   AST 17 10/12/2014 1500   ALT 15 10/12/2014 1500   ALKPHOS 41 10/12/2014 1500   BILITOT 0.2 (L) 10/12/2014 1500   GFRNONAA >90 10/12/2014 1447   GFRAA >90 10/12/2014 1447   No results found for: CHOL, HDL, LDLCALC, LDLDIRECT, TRIG, CHOLHDL No results found for: NWGN5A No results found for: VITAMINB12 No results found for: TSH   10/22/16 MRI brain [I reviewed images myself and agree with interpretation. -VRP]  - normal      ASSESSMENT AND PLAN  41 y.o. year old female here with history of migraine since middle school, migraine free from 2003 until 2015, now with recurrent migraines in December 2017, in the setting of  starting new medication Accutane. Migraines may be related to medication side effect, but have not improved inspite of stopping accutane. We tried several migraine meds without benefit. tried migraine rescue medications.   Tried and failed: TPX (foggy sensation; tingling sensation), gabapentin (300mg  at bedtime; cannot tolerate higher dose), rizatriptan (not effective)  Dx: migraine headaches + medication side effect + occipital neuralgia?  1. Migraine with aura and without status migrainosus, not intractable   2. Cervico-occipital neuralgia of left side  PLAN:  I spent 25 minutes of face to face time with patient. Greater than 50% of time was spent in counseling and coordination of care with patient. In summary we discussed:   MIGRAINE HEADACHE (established problem, worsening)  - stop gabapentin 300mg  at bedtime  - start amitriptyline 25mg  at bedtime  - continue rescue meds (cambia + rizatriptan) as needed   - I will discuss possible second opinion with Dr. Lucia Gaskins and possible left occipital nerve block   Meds ordered this encounter  Medications  . amitriptyline (ELAVIL) 25 MG tablet    Sig: Take 1 tablet (25 mg total) by mouth at bedtime.    Dispense:  30 tablet    Refill:  6  . predniSONE (DELTASONE) 10 MG tablet    Sig: Take 60mg  on day 1. Reduce by 10mg  each subsequent day. (60, 50, 40, 30, 20, 10, stop)    Dispense:  21 tablet    Refill:  0   Return in about 3 months (around 02/23/2017).    Suanne Marker, MD 11/23/2016, 9:35 AM Certified in Neurology, Neurophysiology and Neuroimaging  [facility] [address] [place], [place] [address] [phone]

## 2016-11-26 ENCOUNTER — Telehealth: Payer: Self-pay

## 2016-11-26 ENCOUNTER — Ambulatory Visit: Payer: BLUE CROSS/BLUE SHIELD | Admitting: Nurse Practitioner

## 2016-11-26 NOTE — Telephone Encounter (Signed)
Called pt and offered appt on Wed at 8 am. Asked that she call back to schedule.

## 2016-11-26 NOTE — Telephone Encounter (Signed)
-----   Message from Anson FretAntonia B Ahern, MD sent at 11/25/2016  7:03 PM EDT ----- Absolutely!!!  Candise BowensJen, can you get her on my schedule this week asap?  Thanks!!  ----- Message ----- From: Suanne MarkerPenumalli, Vikram R, MD Sent: 11/23/2016   2:49 PM To: Anson FretAntonia B Ahern, MD  I need your help with this patient. Intractable migraine + poss left occipital neuralgia. Would you be able to see patient for consult and left occipital nerve block?  Thanks,  VRP

## 2016-11-27 NOTE — Telephone Encounter (Signed)
Patient called office returning RN's call.  Patient states she appreciates the appointment on Wednesday, but she has a cold and doesn't know where her pain is coming from.  Patient would like to know if she can do an appointment next week instead.  Please call

## 2016-11-28 NOTE — Telephone Encounter (Signed)
Called and offered pt appt for Mon June 25th @ 9 am (which was placed on hold). Pt may call back to schedule.

## 2016-11-29 NOTE — Telephone Encounter (Signed)
Pt called back, appt scheduled for 6/25 @ 9.

## 2016-12-06 DIAGNOSIS — G43909 Migraine, unspecified, not intractable, without status migrainosus: Secondary | ICD-10-CM | POA: Diagnosis not present

## 2016-12-06 DIAGNOSIS — H5213 Myopia, bilateral: Secondary | ICD-10-CM | POA: Diagnosis not present

## 2016-12-14 ENCOUNTER — Other Ambulatory Visit: Payer: Self-pay | Admitting: Diagnostic Neuroimaging

## 2016-12-14 MED ORDER — AMITRIPTYLINE HCL 25 MG PO TABS: 50.0000 mg | ORAL_TABLET | Freq: Every day | ORAL | 5 refills | Status: DC

## 2016-12-14 NOTE — Telephone Encounter (Signed)
Pt request refill for amitriptyline (ELAVIL) 25 MG tablet. She has increased to 2 tab at bedtime and needs new RX with correct directions sent to [facility]/[place]

## 2016-12-14 NOTE — Telephone Encounter (Signed)
Spoke with patient and advised her that Dr Marjory LiesPenumalli is out of the office, and this RN found no documentation that Dr Marjory LiesPenumalli instructed her she could increase the dose to 50 mg nightly. Patient stated that it was a verbal conversation. She did state she will run out of the medication this weekend, and she stated it is helping with her headaches. She stated "I feel so much better."  This RN stated she would route to the on call dr to address. Confirmed her pharmacy as [facility] ,[place]. Patient verbalized understanding, appreciation.

## 2016-12-14 NOTE — Addendum Note (Signed)
Addended by: Huston FoleyATHAR, Geral Coker on: 12/14/2016 01:55 PM   Modules accepted: Orders

## 2016-12-14 NOTE — Telephone Encounter (Signed)
Okay to continue elavil at 50 mg qHS. Rx adjusted and sent to pharm on behalf of Dr. Marjory LiesPenumalli.

## 2016-12-17 ENCOUNTER — Ambulatory Visit (INDEPENDENT_AMBULATORY_CARE_PROVIDER_SITE_OTHER): Payer: BLUE CROSS/BLUE SHIELD | Admitting: Neurology

## 2016-12-17 ENCOUNTER — Encounter: Payer: Self-pay | Admitting: Neurology

## 2016-12-17 DIAGNOSIS — R519 Headache, unspecified: Secondary | ICD-10-CM

## 2016-12-17 DIAGNOSIS — M542 Cervicalgia: Secondary | ICD-10-CM

## 2016-12-17 DIAGNOSIS — G43719 Chronic migraine without aura, intractable, without status migrainosus: Secondary | ICD-10-CM

## 2016-12-17 DIAGNOSIS — R51 Headache: Secondary | ICD-10-CM

## 2016-12-17 NOTE — Progress Notes (Signed)
[facility]    Provider:  Dr Lucia Gaskins Referring Provider: Farris Has, MD Primary Care Physician:  Farris Has, MD  CC:  Chronic intractable migraines  HPI:  [name] is a 41 y.o. female here as a referral from Dr. Kateri Plummer for migraines. Review of records in the chart: This is a 41 year old patient with chronic migraines. Patient has been following with my colleague here at [facility].  Review of records patient has had migraines since middle school, severe unilateral throbbing painful headaches with nausea, vomiting, photophobia, no auras possibly rarely scintillating scotoma but often not, she didn't seen at the headache wellness Center in the past, chronic left occipital pain and sensitivity, intermittent migratory scalp sensations, muscle cramps and tension in her shoulders, daily headaches, decreased sleep. She has a constant headache on the left side, moderately severe in intensity and waxes and wanes but never disappears completely. On average and 7 out of 10 in pain. She takes ibuprofen and Excedrin when it is severe but no medication overuse no more than 10 times a month. The headaches are on the left side, pounding and throbbing, phonophobia and photophobia, with nausea. Patient was on Accutane.Migraines are in the occipital area, the scalp is very sensitive, was seen at [facility] pain clinic with a trial of mobility, lidocaine injections in the shoulders were helpful as was acupuncture. She has tried to decrease caffeine, increase water and exercise more.   Patient is here for a new appointment, per patient: She is on 50mg  of amitriptylone at night. Headaches are unilateral always on the left, can be behind the eye or occipital, can be dull or throbbing can be severe and ice pick throughout there eye with light and sound sensitivity, nausea, can be severe . She has daily headaches for the last 6 months. And at least 15 are migrainous. A dark room helps. No medication  overuse, uses OTC or triptans less than 10 tomes a month. Ongoing for the last 6 months at this frequency. Can be severe. Can vomit. Can last up to 24 hours. No aura. No vision changes. Occipital left-sided pain.   Medications tried include: Topamax (fogginess and drowsiness), prednisone packs(one helped), Rizatriptan (did not help), meloxicam with mild benefit, trigger point injections and shoulders, acupuncture 2. Maxalt., Depakote, Compazine in a migraine cocktail, gabapentin (makes her drowsy and hung over), Imitrex, Cambia, amitriptyline  Reviewed notes, labs and imaging from outside physicians, which showed:  Personally reviewed MRI images of the brain which were normal 10/22/2016.  Review of Systems: Patient complains of symptoms per HPI as well as the following symptoms: no rash, no CP, no SIB Pertinent negatives and positives per HPI. All others negative.   Social History   Social History  . Marital status: Married    Spouse name: Brett Canales  . Number of children: 2  . 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 Smoking status: Former Smoker    Quit date: 06/28/1999  . Smokeless tobacco: Never Used  . Alcohol use No     Comment: 1-2/year  . Drug use: No  . Sexual activity: Not on file   Other Topics Concern  . Not on file   Social History Narrative   Married 2 children   Right handed   BSBA   3-4 cups daily, 11/23/16 decreased by 2/3     Family History  Problem Relation Age of Onset  .  Hypertension Mother   . Diabetes Mother   . Celiac disease Mother   . Coronary artery disease Maternal Grandfather   . Pulmonary fibrosis Father     Past Medical History:  Diagnosis Date  . Anxiety   . Celiac sprue   . Headache    migraines  . TMJ arthralgia     Past Surgical History:  Procedure Laterality Date  . lymph node removal  2000    Current Outpatient Prescriptions  Medication Sig  Dispense Refill  . ALPRAZolam (XANAX) 0.25 MG tablet 0.25 mg as needed.  0  . amitriptyline (ELAVIL) 25 MG tablet Take 2 tablets (50 mg total) by mouth at bedtime. 60 tablet 5  . escitalopram (LEXAPRO) 20 MG tablet 20 mg daily.  2  . hyoscyamine (LEVSIN SL) 0.125 MG SL tablet DISSOLVE 1 TABLET UNDER THE TONGUE EVERY 4 HOURS AS NEEDED FOR ABDOMINAL CRAMPING 60 tablet 3  . levonorgestrel (MIRENA) 20 MCG/24HR IUD 1 each by Intrauterine route once.    . meloxicam (MOBIC) 7.5 MG tablet 7.5 mg 2 (two) times daily.  0  . Multiple Vitamin (MULTIVITAMIN) tablet Take 1 tablet by mouth daily.    Marland Kitchen. Plecanatide (TRULANCE) 3 MG TABS Take 3 mg by mouth daily.    . rizatriptan (MAXALT-MLT) 10 MG disintegrating tablet Take 1 tablet (10 mg total) by mouth as needed for migraine. May repeat in 2 hours if needed 9 tablet 11   No current facility-administered medications for this visit.     Allergies as of 12/17/2016 - Review Complete 12/17/2016  Allergen Reaction Noted  . Topamax [topiramate] Other (See Comments) 11/23/2016    Vitals: BP 106/68   Pulse 87   Ht 5\' 4"  (1.626 m)   Wt 118 lb 6.4 oz (53.7 kg)   BMI 20.32 kg/m  Last Weight:  Wt Readings from Last 1 Encounters:  12/17/16 118 lb 6.4 oz (53.7 kg)   Last Height:   Ht Readings from Last 1 Encounters:  12/17/16 5\' 4"  (1.626 m)     Physical exam: Exam: Gen: NAD, conversant, well nourised, well groomed                     CV: RRR, no MRG. No Carotid Bruits. No peripheral edema, warm, nontender Eyes: Conjunctivae clear without exudates or hemorrhage  Neuro: Detailed Neurologic Exam  Speech:    Speech is normal; fluent and spontaneous with normal comprehension.  Cognition:    The patient is oriented to person, place, and time;     recent and remote memory intact;     language fluent;     normal attention, concentration,     fund of knowledge Cranial Nerves:    The pupils are equal, round, and reactive to light. The fundi are normal  and spontaneous venous pulsations are present. Visual fields are full to finger confrontation. Extraocular movements are intact. Trigeminal sensation is intact and the muscles of mastication are normal. The face is symmetric. The palate elevates in the midline. Hearing intact. Voice is normal. Shoulder shrug is normal. The tongue has normal motion without fasciculations.   Coordination:    Normal finger to nose and heel to shin. Normal rapid alternating movements.   Gait:    Heel-toe and tandem gait are normal.   Motor Observation:    No asymmetry, no atrophy, and no involuntary movements noted. Tone:    Normal muscle tone.    Posture:    Posture is normal. normal erect  Strength:    Strength is V/V in the upper and lower limbs.      Sensation: intact to LT     Reflex Exam:  DTR's:    Deep tendon reflexes in the upper and lower extremities are normal bilaterally.   Toes:    The toes are downgoing bilaterally.   Clonus:    Clonus is absent.     Assessment/Plan:  This is a 41 year old patient with chronic intractable migraines without aura was failed multiple classes of medications daily headaches 15 or more migrainous a month can last up to 24 hours no medication overuse. Patient would be a good candidate for Botox therapy at this point. Also talked about new class of medications Aimovig. Performed nerve blocks today in the office. We'll see her back for Botox therapy. Discussed physical therapy and massage and she will get back to [place] about that for her musculoskeletal neck pain. Continue amitriptyline.  Performed by Dr. Lucia Gaskins M.D. 1ml Lidocaine 1%,46ml Marcaine 0.5%, 1ml depo medrol in the 30-gauge needle was used. All procedures a documented blood were medically necessary, reasonable and appropriate based on the patient's history, medical diagnosis and physician opinion. Verbal informed consent was obtained from the patient, patient was informed of potential risk of procedure,  including bruising, bleeding, hematoma formation, infection, muscle weakness, muscle pain, numbness, transient hypertension, transient hyperglycemia and transient insomnia among others. All areas injected were topically clean with isopropyl rubbing alcohol. Nonsterile nonlatex gloves were worn during the procedure.  1. 20553: trapezius, occipitalis, splenius capitus, semispinalis capitus  Naomie Dean, MD  [facility] [address] [place], [place] [address]  Phone [phone] Fax [phone]  A total of 30 minutes was spent face-to-face with this patient. Over half this time was spent on counseling patient on the musculoskeletal neck pain, chronic intractable migraines, occipital headache diagnosis and different diagnostic and therapeutic options available. This does not include time spent for nerve blocks.

## 2016-12-17 NOTE — Progress Notes (Signed)
Nerve block:  Xylocaine 2% (20 mg/ml) NDC [id number] Lot [id number] Exp 02/21  Bupivacaine 0.25%  NDC [id number] Lot [id number] Exp: 03/22  Depo-medrol 80 mg/ml NDC [phone] Lot [id number] Exp 02/2017  One 3 ml syringes w/ 1 ml marcaine, 1 ml lidocaine and 1 ml depo

## 2017-01-01 ENCOUNTER — Other Ambulatory Visit: Payer: Self-pay | Admitting: Gastroenterology

## 2017-01-01 ENCOUNTER — Ambulatory Visit
Admission: RE | Admit: 2017-01-01 | Discharge: 2017-01-01 | Disposition: A | Discharge status: Home / Self Care | Payer: BLUE CROSS/BLUE SHIELD | Source: Ambulatory Visit | Attending: Gastroenterology | Admitting: Gastroenterology

## 2017-01-01 DIAGNOSIS — R140 Abdominal distension (gaseous): Secondary | ICD-10-CM

## 2017-01-01 DIAGNOSIS — K5900 Constipation, unspecified: Secondary | ICD-10-CM

## 2017-01-01 DIAGNOSIS — K5904 Chronic idiopathic constipation: Secondary | ICD-10-CM | POA: Diagnosis not present

## 2017-01-01 DIAGNOSIS — R194 Change in bowel habit: Secondary | ICD-10-CM | POA: Diagnosis not present

## 2017-01-01 NOTE — Progress Notes (Signed)
Viaan Knippenberg MD 

## 2017-01-03 ENCOUNTER — Telehealth: Payer: Self-pay | Admitting: Neurology

## 2017-01-03 NOTE — Telephone Encounter (Signed)
Called and spoke to Patient  She is going to call [facility] and give consent [phone]. Botox . After patient calls and give consent I can set up delivery.

## 2017-01-04 ENCOUNTER — Encounter: Payer: Self-pay | Admitting: Neurology

## 2017-01-07 DIAGNOSIS — G43719 Chronic migraine without aura, intractable, without status migrainosus: Secondary | ICD-10-CM | POA: Diagnosis not present

## 2017-01-09 ENCOUNTER — Ambulatory Visit (INDEPENDENT_AMBULATORY_CARE_PROVIDER_SITE_OTHER): Payer: BLUE CROSS/BLUE SHIELD | Admitting: Neurology

## 2017-01-09 VITALS — BP 97/66 | HR 78 | Ht 64.0 in | Wt 117.6 lb

## 2017-01-09 DIAGNOSIS — G43719 Chronic migraine without aura, intractable, without status migrainosus: Secondary | ICD-10-CM

## 2017-01-09 NOTE — Progress Notes (Signed)
Botox 100 units/vial x 2 [facility] [phone] Lot [id number] Exp 01 2021  Diluted in 4 ml of Bacteriostatic 0.9% NaCl NDC [phone] Lot [id number] Exp [id number]

## 2017-01-09 NOTE — Progress Notes (Signed)

## 2017-01-10 ENCOUNTER — Telehealth: Payer: Self-pay | Admitting: Neurology

## 2017-01-10 NOTE — Telephone Encounter (Signed)
Attempted to call pt on listed C # but mssg stated disconnected or no longer in service. Left VM mssg @ H # for pt to return call if needed.

## 2017-01-10 NOTE — Telephone Encounter (Signed)
Pt calling just to extend thanks to [employer] Jennifer re: pt's episode on yesterday.  She would like a call back

## 2017-01-14 ENCOUNTER — Ambulatory Visit: Payer: BLUE CROSS/BLUE SHIELD | Admitting: Neurology

## 2017-01-31 ENCOUNTER — Encounter: Payer: Self-pay | Admitting: Neurology

## 2017-02-04 DIAGNOSIS — G43909 Migraine, unspecified, not intractable, without status migrainosus: Secondary | ICD-10-CM | POA: Diagnosis not present

## 2017-02-04 DIAGNOSIS — M542 Cervicalgia: Secondary | ICD-10-CM | POA: Diagnosis not present

## 2017-02-04 DIAGNOSIS — R51 Headache: Secondary | ICD-10-CM | POA: Diagnosis not present

## 2017-02-06 DIAGNOSIS — G43909 Migraine, unspecified, not intractable, without status migrainosus: Secondary | ICD-10-CM | POA: Diagnosis not present

## 2017-02-06 DIAGNOSIS — M542 Cervicalgia: Secondary | ICD-10-CM | POA: Diagnosis not present

## 2017-02-06 DIAGNOSIS — R51 Headache: Secondary | ICD-10-CM | POA: Diagnosis not present

## 2017-02-06 NOTE — Telephone Encounter (Signed)
Received faxed PT eval from [facility] w/ plan for visits 2x/wk for 8-12 wks. Placed in Dr. Trevor MaceAhern's inbox for review.

## 2017-02-11 DIAGNOSIS — M542 Cervicalgia: Secondary | ICD-10-CM | POA: Diagnosis not present

## 2017-02-11 DIAGNOSIS — R51 Headache: Secondary | ICD-10-CM | POA: Diagnosis not present

## 2017-02-11 DIAGNOSIS — G43909 Migraine, unspecified, not intractable, without status migrainosus: Secondary | ICD-10-CM | POA: Diagnosis not present

## 2017-02-19 DIAGNOSIS — R51 Headache: Secondary | ICD-10-CM | POA: Diagnosis not present

## 2017-02-19 DIAGNOSIS — M542 Cervicalgia: Secondary | ICD-10-CM | POA: Diagnosis not present

## 2017-02-19 DIAGNOSIS — G43909 Migraine, unspecified, not intractable, without status migrainosus: Secondary | ICD-10-CM | POA: Diagnosis not present

## 2017-02-22 DIAGNOSIS — Z124 Encounter for screening for malignant neoplasm of cervix: Secondary | ICD-10-CM | POA: Diagnosis not present

## 2017-02-22 DIAGNOSIS — Z01419 Encounter for gynecological examination (general) (routine) without abnormal findings: Secondary | ICD-10-CM | POA: Diagnosis not present

## 2017-02-22 DIAGNOSIS — Z6820 Body mass index (BMI) 20.0-20.9, adult: Secondary | ICD-10-CM | POA: Diagnosis not present

## 2017-03-13 DIAGNOSIS — G43909 Migraine, unspecified, not intractable, without status migrainosus: Secondary | ICD-10-CM | POA: Diagnosis not present

## 2017-03-13 DIAGNOSIS — R51 Headache: Secondary | ICD-10-CM | POA: Diagnosis not present

## 2017-03-13 DIAGNOSIS — M542 Cervicalgia: Secondary | ICD-10-CM | POA: Diagnosis not present

## 2017-03-15 ENCOUNTER — Ambulatory Visit: Payer: BLUE CROSS/BLUE SHIELD | Admitting: Diagnostic Neuroimaging

## 2017-03-29 NOTE — Telephone Encounter (Signed)
ERROR

## 2017-04-01 ENCOUNTER — Encounter: Payer: Self-pay | Admitting: Neurology

## 2017-04-02 NOTE — Telephone Encounter (Signed)
I called to schedule patient with no answer, left a VM asking her to call me back.

## 2017-04-15 ENCOUNTER — Telehealth: Payer: Self-pay | Admitting: Neurology

## 2017-04-15 NOTE — Telephone Encounter (Signed)
Patient is calling back to find out if you have heard from the insurance company regarding Botox.

## 2017-04-16 DIAGNOSIS — G43719 Chronic migraine without aura, intractable, without status migrainosus: Secondary | ICD-10-CM | POA: Diagnosis not present

## 2017-04-16 NOTE — Telephone Encounter (Signed)
I called the patient back but she did not answer. I left a VM asking her to call back.

## 2017-04-16 NOTE — Telephone Encounter (Signed)
I spoke with the patient and informed her that her medication would be here in the morning.

## 2017-04-18 ENCOUNTER — Ambulatory Visit (INDEPENDENT_AMBULATORY_CARE_PROVIDER_SITE_OTHER): Payer: BLUE CROSS/BLUE SHIELD | Admitting: Neurology

## 2017-04-18 ENCOUNTER — Telehealth: Payer: Self-pay | Admitting: Neurology

## 2017-04-18 ENCOUNTER — Encounter: Payer: Self-pay | Admitting: Neurology

## 2017-04-18 VITALS — BP 105/67 | HR 72 | Ht 64.0 in | Wt 117.0 lb

## 2017-04-18 DIAGNOSIS — G43711 Chronic migraine without aura, intractable, with status migrainosus: Secondary | ICD-10-CM

## 2017-04-18 NOTE — Progress Notes (Signed)
Botox-100unitsx2 vials Lot: [id number] Expiration: 09/2019 NDC: [phone] [id number]  0.9% Sodium Chloride- 4mL total Lot: [id number] Expiration: 07/2018 NDC: 5366-4403-470409-4888-03  Dx: G43.711 S/P

## 2017-04-18 NOTE — Telephone Encounter (Signed)
Per dr. Lucia GaskinsAhern, pt needs botox in 12 weeks. She would prefer a 4:15 apt

## 2017-04-19 DIAGNOSIS — Z3202 Encounter for pregnancy test, result negative: Secondary | ICD-10-CM | POA: Diagnosis not present

## 2017-04-19 DIAGNOSIS — Z6820 Body mass index (BMI) 20.0-20.9, adult: Secondary | ICD-10-CM | POA: Diagnosis not present

## 2017-04-19 DIAGNOSIS — Z30433 Encounter for removal and reinsertion of intrauterine contraceptive device: Secondary | ICD-10-CM | POA: Diagnosis not present

## 2017-04-22 NOTE — Progress Notes (Signed)

## 2017-04-23 NOTE — Telephone Encounter (Signed)
I called and scheduled the patient.  °

## 2017-04-24 DIAGNOSIS — Z6820 Body mass index (BMI) 20.0-20.9, adult: Secondary | ICD-10-CM | POA: Diagnosis not present

## 2017-04-24 DIAGNOSIS — Z30431 Encounter for routine checking of intrauterine contraceptive device: Secondary | ICD-10-CM | POA: Diagnosis not present

## 2017-04-30 ENCOUNTER — Encounter: Payer: Self-pay | Admitting: Neurology

## 2017-04-30 ENCOUNTER — Other Ambulatory Visit: Payer: Self-pay | Admitting: Neurology

## 2017-04-30 MED ORDER — GABAPENTIN 300 MG PO CAPS: 300.0000 mg | ORAL_CAPSULE | Freq: Three times a day (TID) | ORAL | 11 refills | Status: DC

## 2017-04-30 NOTE — Progress Notes (Signed)
gabapentin

## 2017-07-18 DIAGNOSIS — G43909 Migraine, unspecified, not intractable, without status migrainosus: Secondary | ICD-10-CM | POA: Diagnosis not present

## 2017-07-18 DIAGNOSIS — F411 Generalized anxiety disorder: Secondary | ICD-10-CM | POA: Diagnosis not present

## 2017-07-24 ENCOUNTER — Telehealth: Payer: Self-pay | Admitting: Neurology

## 2017-07-24 ENCOUNTER — Ambulatory Visit: Payer: BLUE CROSS/BLUE SHIELD | Admitting: Neurology

## 2017-07-24 ENCOUNTER — Encounter: Payer: Self-pay | Admitting: Neurology

## 2017-07-24 VITALS — BP 100/68 | HR 74

## 2017-07-24 DIAGNOSIS — G43711 Chronic migraine without aura, intractable, with status migrainosus: Secondary | ICD-10-CM

## 2017-07-24 NOTE — Telephone Encounter (Signed)
12 week Botox appointment needed.  Patient requests 12 weeks from today on or around 4:00PM.

## 2017-07-24 NOTE — Progress Notes (Signed)
Interval history: Out of 30 days, she has had 2 migraines which is a drastic improvement. >75% decrease in frequency.   Consent Form Botulism Toxin Injection For Chronic Migraine  Botulism toxin has been approved by the Federal drug administration for treatment of chronic migraine. Botulism toxin does not cure chronic migraine and it may not be effective in some patients.  The administration of botulism toxin is accomplished by injecting a small amount of toxin into the muscles of the neck and head. Dosage must be titrated for each individual. Any benefits resulting from botulism toxin tend to wear off after 3 months with a repeat injection required if benefit is to be maintained. Injections are usually done every 3-4 months with maximum effect peak achieved by about 2 or 3 weeks. Botulism toxin is expensive and you should be sure of what costs you will incur resulting from the injection.  The side effects of botulism toxin use for chronic migraine may include:   -Transient, and usually mild, facial weakness with facial injections  -Transient, and usually mild, head or neck weakness with head/neck injections  -Reduction or loss of forehead facial animation due to forehead muscle              weakness  -Eyelid drooping  -Dry eye  -Pain at the site of injection or bruising at the site of injection  -Double vision  -Potential unknown long term risks  Contraindications: You should not have Botox if you are pregnant, nursing, allergic to albumin, have an infection, skin condition, or muscle weakness at the site of the injection, or have myasthenia gravis, Lambert-Eaton syndrome, or ALS.  It is also possible that as with any injection, there may be an allergic reaction or no effect from the medication. Reduced effectiveness after repeated injections is sometimes seen and rarely infection at the injection site may occur. All care will be taken to prevent these side effects. If therapy is given  over a long time, atrophy and wasting in the muscle injected may occur. Occasionally the patient's become refractory to treatment because they develop antibodies to the toxin. In this event, therapy needs to be modified.  I have read the above information and consent to the administration of botulism toxin.    ______________  _____   _________________  Patient signature     Date   Witness signature       BOTOX PROCEDURE NOTE FOR MIGRAINE HEADACHE    Contraindications and precautions discussed with patient(above). Aseptic procedure was observed and patient tolerated procedure. Procedure performed by Dr. Artemio Alyoni Meleana Commerford  The condition has existed for more than 6 months, and pt does not have a diagnosis of ALS, Myasthenia Gravis or Lambert-Eaton Syndrome. Risks and benefits of injections discussed and pt agrees to proceed with the procedure. Written consent obtained  These injections are medically necessary. He receives good benefits from these injections. These injections do not cause sedations or hallucinations which the oral therapies may cause.  Indication/Diagnosis: chronic migraine BOTOX(J0585) injection was performed according to protocol by [employer]. 200 units of BOTOX was dissolved into 4 cc NS.  NDC: [address]-4000023-1145-01  Type of toxin: Botox  Description of procedure:  The patient was placed in a sitting position. The standard protocol was used for Botox as follows, with 5 units of Botox injected at each site:   -Procerus muscle, midline injection  -Corrugator muscle, bilateral injection  -Frontalis muscle, bilateral injection, with 2 sites each side, medial injection was performed in the upper  one third of the frontalis muscle, in the region vertical from the medial inferior edge of the superior orbital rim. The lateral injection was again in the upper one third of the forehead vertically above the lateral limbus of the cornea, 1.5 cm lateral to the medial injection  site.  -Temporalis muscle injection, 4 sites, bilaterally. The first injection was 3 cm above the tragus of the ear, second injection site was 1.5 cm to 3 cm up from the first injection site in line with the tragus of the ear. The third injection site was 1.5-3 cm forward between the first 2 injection sites. The fourth injection site was 1.5 cm posterior to the second injection site.  -Occipitalis muscle injection, 3 sites, bilaterally. The first injection was done one half way between the occipital protuberance and the tip of the mastoid process behind the ear. The second injection site was done lateral and superior to the first, 1 fingerbreadth from the first injection. The third injection site was 1 fingerbreadth superiorly and medially from the first injection site.  -Cervical paraspinal muscle injection, 2 sites, bilateral knee first injection site was 1 cm from the midline of the cervical spine, 3 cm inferior to the lower border of the occipital protuberance. The second injection site was 1.5 cm superiorly and laterally to the first injection site.  -Trapezius muscle injection was performed at 3 sites, bilaterally. The first injection site was in the upper trapezius muscle halfway between the inflection point of the neck, and the acromion. The second injection site was one half way between the acromion and the first injection site. The third injection was done between the first injection site and the inflection point of the neck.   Will return for repeat injection in 3 months.   A 200 unit sof Botox was used, 155 units were injected, the rest of the Botox was wasted. The patient tolerated the procedure well, there were no complications of the above procedure.

## 2017-07-24 NOTE — Progress Notes (Signed)
Botox- 100 units x 2 vials Lot: [id number] Expiration: 12/2019 NDC: [id number]  Bacteriostatic 0.9% Sodium Chloride- 4mL total Lot: [id number] Expiration: 10/24/2018 NDC: 5188-4166-060409-1966-02  Dx: T01.601G43.711 B/B  Nerve Block w/o Steroid 2 syringes of 1:1 Bupivocaine & Lidocaine  Bupivocaine 0.5 % 3 mL total LOT: [id number] EXP: 10/24/2018 NDC: [id number]  Lidocaine 2% 3 mL total LOT: [id number] EXP: 02/24/2019 NDC: [id number]  //BCrn

## 2017-07-24 NOTE — Patient Instructions (Signed)
Gralise: Take 1-2 tablets with food in the evening At onset of migraine take Maxalt and Cambia. Can repeat in 2 hours. Max twice in one day.

## 2017-07-25 MED ORDER — DICLOFENAC POTASSIUM(MIGRAINE) 50 MG PO PACK: 50.0000 mg | PACK | Freq: Once | ORAL | 11 refills | Status: DC | PRN

## 2017-07-25 MED ORDER — GABAPENTIN (ONCE-DAILY) 300 MG PO TABS: 600.0000 mg | ORAL_TABLET | Freq: Every day | ORAL | 11 refills | Status: DC

## 2017-07-25 MED ORDER — DICLOFENAC POTASSIUM(MIGRAINE) 50 MG PO PACK: 50.0000 mg | PACK | Freq: Every day | ORAL | 0 refills | Status: DC

## 2017-07-25 MED ORDER — GABAPENTIN (ONCE-DAILY) 300 MG PO TABS: 600.0000 mg | ORAL_TABLET | Freq: Every day | ORAL | 6 refills | Status: DC

## 2017-07-26 DIAGNOSIS — M542 Cervicalgia: Secondary | ICD-10-CM | POA: Diagnosis not present

## 2017-07-26 DIAGNOSIS — M25511 Pain in right shoulder: Secondary | ICD-10-CM | POA: Diagnosis not present

## 2017-07-26 DIAGNOSIS — M25512 Pain in left shoulder: Secondary | ICD-10-CM | POA: Diagnosis not present

## 2017-07-26 DIAGNOSIS — G44209 Tension-type headache, unspecified, not intractable: Secondary | ICD-10-CM | POA: Diagnosis not present

## 2017-07-29 NOTE — Telephone Encounter (Signed)
I called to schedule the patient for her next injections but she did not answer so I left a VM asking her to call me back.

## 2017-07-31 DIAGNOSIS — G44209 Tension-type headache, unspecified, not intractable: Secondary | ICD-10-CM | POA: Diagnosis not present

## 2017-07-31 DIAGNOSIS — M542 Cervicalgia: Secondary | ICD-10-CM | POA: Diagnosis not present

## 2017-07-31 DIAGNOSIS — M25512 Pain in left shoulder: Secondary | ICD-10-CM | POA: Diagnosis not present

## 2017-07-31 DIAGNOSIS — M25511 Pain in right shoulder: Secondary | ICD-10-CM | POA: Diagnosis not present

## 2017-08-16 DIAGNOSIS — F411 Generalized anxiety disorder: Secondary | ICD-10-CM | POA: Diagnosis not present

## 2017-08-20 DIAGNOSIS — M25511 Pain in right shoulder: Secondary | ICD-10-CM | POA: Diagnosis not present

## 2017-08-20 DIAGNOSIS — M25512 Pain in left shoulder: Secondary | ICD-10-CM | POA: Diagnosis not present

## 2017-08-20 DIAGNOSIS — M542 Cervicalgia: Secondary | ICD-10-CM | POA: Diagnosis not present

## 2017-08-20 DIAGNOSIS — G44209 Tension-type headache, unspecified, not intractable: Secondary | ICD-10-CM | POA: Diagnosis not present

## 2017-08-22 ENCOUNTER — Telehealth: Payer: Self-pay | Admitting: Neurology

## 2017-08-22 DIAGNOSIS — G44209 Tension-type headache, unspecified, not intractable: Secondary | ICD-10-CM | POA: Diagnosis not present

## 2017-08-22 DIAGNOSIS — M25511 Pain in right shoulder: Secondary | ICD-10-CM | POA: Diagnosis not present

## 2017-08-22 DIAGNOSIS — M542 Cervicalgia: Secondary | ICD-10-CM | POA: Diagnosis not present

## 2017-08-22 DIAGNOSIS — M25512 Pain in left shoulder: Secondary | ICD-10-CM | POA: Diagnosis not present

## 2017-08-22 NOTE — Telephone Encounter (Signed)
Pt returning call

## 2017-08-22 NOTE — Telephone Encounter (Signed)
Abigail StanleyLisa asked me to take call . Patient is calling about her Botox Injection she had 07/24/2017. Patient relayed to please call her on her work. Telephone. Patient want's to know why she was [name] and OptometristBill and not speciality .  I relayed to patient [name] was out of the office until Monday . Patient understood.

## 2017-08-22 NOTE — Telephone Encounter (Signed)
I returned the patients call but she did not answer so I left a VM asking her to call me back.  °

## 2017-08-26 NOTE — Telephone Encounter (Signed)
I called the patients work number twice but someone would answer and then the call would get disconnected, I called her cell phone and left her a message and asked her to call me back.

## 2017-08-26 NOTE — Telephone Encounter (Signed)
I called and spoke with the patient and explained her billing process to her. She was understanding and appreciative.

## 2017-08-27 DIAGNOSIS — M542 Cervicalgia: Secondary | ICD-10-CM | POA: Diagnosis not present

## 2017-08-27 DIAGNOSIS — G44209 Tension-type headache, unspecified, not intractable: Secondary | ICD-10-CM | POA: Diagnosis not present

## 2017-08-27 DIAGNOSIS — M25511 Pain in right shoulder: Secondary | ICD-10-CM | POA: Diagnosis not present

## 2017-08-27 DIAGNOSIS — M25512 Pain in left shoulder: Secondary | ICD-10-CM | POA: Diagnosis not present

## 2017-09-05 ENCOUNTER — Encounter: Payer: Self-pay | Admitting: Neurology

## 2017-09-06 NOTE — Telephone Encounter (Signed)
Spoke with rep at [facility] regarding the [place]. She stated that a prior authorization is needed. They need clinical notes in order to get the PA completed.   Fax number for notes: [phone]  Faxed office notes to the number listed above. Received a receipt of confirmation.

## 2017-09-10 DIAGNOSIS — M25511 Pain in right shoulder: Secondary | ICD-10-CM | POA: Diagnosis not present

## 2017-09-10 DIAGNOSIS — M542 Cervicalgia: Secondary | ICD-10-CM | POA: Diagnosis not present

## 2017-09-10 DIAGNOSIS — M25512 Pain in left shoulder: Secondary | ICD-10-CM | POA: Diagnosis not present

## 2017-09-10 DIAGNOSIS — G44209 Tension-type headache, unspecified, not intractable: Secondary | ICD-10-CM | POA: Diagnosis not present

## 2017-09-16 DIAGNOSIS — M25512 Pain in left shoulder: Secondary | ICD-10-CM | POA: Diagnosis not present

## 2017-09-16 DIAGNOSIS — G44209 Tension-type headache, unspecified, not intractable: Secondary | ICD-10-CM | POA: Diagnosis not present

## 2017-09-16 DIAGNOSIS — M542 Cervicalgia: Secondary | ICD-10-CM | POA: Diagnosis not present

## 2017-09-16 DIAGNOSIS — M25511 Pain in right shoulder: Secondary | ICD-10-CM | POA: Diagnosis not present

## 2017-09-16 NOTE — Telephone Encounter (Signed)
[facility] from [facility] that Cambia 50 mg PO or pack is approved from 09/13/17-3/20-2020. Emailed pt to make aware.

## 2017-09-26 DIAGNOSIS — M546 Pain in thoracic spine: Secondary | ICD-10-CM | POA: Diagnosis not present

## 2017-09-26 DIAGNOSIS — M9901 Segmental and somatic dysfunction of cervical region: Secondary | ICD-10-CM | POA: Diagnosis not present

## 2017-09-26 DIAGNOSIS — M9902 Segmental and somatic dysfunction of thoracic region: Secondary | ICD-10-CM | POA: Diagnosis not present

## 2017-09-26 DIAGNOSIS — M542 Cervicalgia: Secondary | ICD-10-CM | POA: Diagnosis not present

## 2017-10-03 ENCOUNTER — Telehealth: Payer: Self-pay | Admitting: *Deleted

## 2017-10-03 NOTE — Telephone Encounter (Signed)
[facility] from [facility] saying that Gralise 300 mg had been approved and quantity had been denied. However [facility] has program for this medication. Checked [facility] portal. Gralise 300 mg #60 order has been completed and was shipped to the patient on 09/30/17 with a final $10 copay.

## 2017-10-08 ENCOUNTER — Other Ambulatory Visit: Payer: Self-pay | Admitting: Diagnostic Neuroimaging

## 2017-10-14 ENCOUNTER — Telehealth: Payer: Self-pay | Admitting: Neurology

## 2017-10-14 NOTE — Telephone Encounter (Signed)
Pt has botox appt on 5/14 and is wanting to be seen sooner. Pt last botox was 1/30. Pt said she can feel the symptoms of a migraine coming on, she was hoping to be seen sooner if possible to ward it off. Please call to advise.

## 2017-10-14 NOTE — Telephone Encounter (Signed)
Spoke with the patient. She thinks she may have missed a call at some point regarding her Botox but her last Botox injection was 1/30 and her next appt is not scheduled until 5/14. She would like to be seen sooner for Botox if at all possible since she was due for next injection around the end of April. RN informed pt that she will check with [name] on a delivery date for the Botox to see if there's any way it can be done sooner. Pt verbalized appreciation.

## 2017-10-22 ENCOUNTER — Telehealth: Payer: Self-pay | Admitting: Neurology

## 2017-10-22 ENCOUNTER — Other Ambulatory Visit: Payer: Self-pay | Admitting: Gastroenterology

## 2017-10-22 DIAGNOSIS — R110 Nausea: Secondary | ICD-10-CM

## 2017-10-22 DIAGNOSIS — R140 Abdominal distension (gaseous): Secondary | ICD-10-CM | POA: Diagnosis not present

## 2017-10-22 DIAGNOSIS — K900 Celiac disease: Secondary | ICD-10-CM | POA: Diagnosis not present

## 2017-10-22 DIAGNOSIS — R1031 Right lower quadrant pain: Secondary | ICD-10-CM | POA: Diagnosis not present

## 2017-10-22 DIAGNOSIS — R1033 Periumbilical pain: Secondary | ICD-10-CM

## 2017-10-22 DIAGNOSIS — K5904 Chronic idiopathic constipation: Secondary | ICD-10-CM | POA: Diagnosis not present

## 2017-10-22 NOTE — Telephone Encounter (Signed)
Botox authorizations [id number] [phone] (07/16/18)

## 2017-10-25 NOTE — Telephone Encounter (Signed)
Shawna with [facility] requesting a call at [phone] to schedule botox delivery

## 2017-10-29 DIAGNOSIS — G43719 Chronic migraine without aura, intractable, without status migrainosus: Secondary | ICD-10-CM | POA: Diagnosis not present

## 2017-10-30 ENCOUNTER — Other Ambulatory Visit: Payer: BLUE CROSS/BLUE SHIELD

## 2017-11-05 ENCOUNTER — Encounter

## 2017-11-05 ENCOUNTER — Encounter: Payer: Self-pay | Admitting: Neurology

## 2017-11-05 ENCOUNTER — Ambulatory Visit (INDEPENDENT_AMBULATORY_CARE_PROVIDER_SITE_OTHER): Payer: BLUE CROSS/BLUE SHIELD | Admitting: Neurology

## 2017-11-05 VITALS — BP 93/64 | HR 67

## 2017-11-05 DIAGNOSIS — G43711 Chronic migraine without aura, intractable, with status migrainosus: Secondary | ICD-10-CM | POA: Diagnosis not present

## 2017-11-05 DIAGNOSIS — M7918 Myalgia, other site: Secondary | ICD-10-CM

## 2017-11-05 NOTE — Progress Notes (Signed)
Interval history: Out of 30 days, she has had 2 migraines which is a drastic improvement. >75% decrease in frequency. She has neck pain will order dry needling and have her back in 2 weeks.  Consent Form Botulism Toxin Injection For Chronic Migraine  Botulism toxin has been approved by the Federal drug administration for treatment of chronic migraine. Botulism toxin does not cure chronic migraine and it may not be effective in some patients.  The administration of botulism toxin is accomplished by injecting a small amount of toxin into the muscles of the neck and head. Dosage must be titrated for each individual. Any benefits resulting from botulism toxin tend to wear off after 3 months with a repeat injection required if benefit is to be maintained. Injections are usually done every 3-4 months with maximum effect peak achieved by about 2 or 3 weeks. Botulism toxin is expensive and you should be sure of what costs you will incur resulting from the injection.  The side effects of botulism toxin use for chronic migraine may include:   -Transient, and usually mild, facial weakness with facial injections  -Transient, and usually mild, head or neck weakness with head/neck injections  -Reduction or loss of forehead facial animation due to forehead muscle              weakness  -Eyelid drooping  -Dry eye  -Pain at the site of injection or bruising at the site of injection  -Double vision  -Potential unknown long term risks  Contraindications: You should not have Botox if you are pregnant, nursing, allergic to albumin, have an infection, skin condition, or muscle weakness at the site of the injection, or have myasthenia gravis, Lambert-Eaton syndrome, or ALS.  It is also possible that as with any injection, there may be an allergic reaction or no effect from the medication. Reduced effectiveness after repeated injections is sometimes seen and rarely infection at the injection site may occur. All  care will be taken to prevent these side effects. If therapy is given over a long time, atrophy and wasting in the muscle injected may occur. Occasionally the patient's become refractory to treatment because they develop antibodies to the toxin. In this event, therapy needs to be modified.  I have read the above information and consent to the administration of botulism toxin.    ______________  _____   _________________  Patient signature     Date   Witness signature       BOTOX PROCEDURE NOTE FOR MIGRAINE HEADACHE    Contraindications and precautions discussed with patient(above). Aseptic procedure was observed and patient tolerated procedure. Procedure performed by Dr. Artemio Aly  The condition has existed for more than 6 months, and pt does not have a diagnosis of ALS, Myasthenia Gravis or Lambert-Eaton Syndrome. Risks and benefits of injections discussed and pt agrees to proceed with the procedure. Written consent obtained  These injections are medically necessary. He receives good benefits from these injections. These injections do not cause sedations or hallucinations which the oral therapies may cause.  Indication/Diagnosis: chronic migraine BOTOX(J0585) injection was performed according to protocol by [employer]. 200 units of BOTOX was dissolved into 4 cc NS.  NDC: [address]-91  Type of toxin: Botox  Description of procedure:  The patient was placed in a sitting position. The standard protocol was used for Botox as follows, with 5 units of Botox injected at each site:   -Procerus muscle, midline injection  -Corrugator muscle, bilateral injection  -Frontalis  muscle, bilateral injection, with 2 sites each side, medial injection was performed in the upper one third of the frontalis muscle, in the region vertical from the medial inferior edge of the superior orbital rim. The lateral injection was again in the upper one third of the forehead vertically above the lateral  limbus of the cornea, 1.5 cm lateral to the medial injection site.  -Temporalis muscle injection, 4 sites, bilaterally. The first injection was 3 cm above the tragus of the ear, second injection site was 1.5 cm to 3 cm up from the first injection site in line with the tragus of the ear. The third injection site was 1.5-3 cm forward between the first 2 injection sites. The fourth injection site was 1.5 cm posterior to the second injection site.  -Occipitalis muscle injection, 3 sites, bilaterally. The first injection was done one half way between the occipital protuberance and the tip of the mastoid process behind the ear. The second injection site was done lateral and superior to the first, 1 fingerbreadth from the first injection. The third injection site was 1 fingerbreadth superiorly and medially from the first injection site.  -Cervical paraspinal muscle injection, 2 sites, bilateral knee first injection site was 1 cm from the midline of the cervical spine, 3 cm inferior to the lower border of the occipital protuberance. The second injection site was 1.5 cm superiorly and laterally to the first injection site.  -Trapezius muscle injection was performed at 3 sites, bilaterally. The first injection site was in the upper trapezius muscle halfway between the inflection point of the neck, and the acromion. The second injection site was one half way between the acromion and the first injection site. The third injection was done between the first injection site and the inflection point of the neck.   Will return for repeat injection in 3 months.   A 200 unit sof Botox was used, 155 units were injected, the rest of the Botox was wasted. The patient tolerated the procedure well, there were no complications of the above procedure.

## 2017-11-05 NOTE — Progress Notes (Signed)
Botox- 100 units x 2 vials Lot: [id number] Expiration: 02/2020 NDC: 0023-1145-01  Bacteriostatic 0.9% Sodium Chloride- 4mL total Lot: [id number] Expiration: 10/24/2018 NDC: 0409-1966-02  Dx: G43.711 S/P //BCrn   

## 2017-12-04 ENCOUNTER — Ambulatory Visit: Payer: BLUE CROSS/BLUE SHIELD | Attending: Neurology | Admitting: Physical Therapy

## 2017-12-04 ENCOUNTER — Encounter: Payer: Self-pay | Admitting: Physical Therapy

## 2017-12-04 ENCOUNTER — Other Ambulatory Visit: Payer: Self-pay

## 2017-12-04 DIAGNOSIS — M542 Cervicalgia: Secondary | ICD-10-CM | POA: Insufficient documentation

## 2017-12-04 DIAGNOSIS — R293 Abnormal posture: Secondary | ICD-10-CM | POA: Diagnosis not present

## 2017-12-04 DIAGNOSIS — M62838 Other muscle spasm: Secondary | ICD-10-CM | POA: Insufficient documentation

## 2017-12-04 NOTE — Therapy (Signed)
[facility] [address] [place], [place], [address] Phone: [phone]   Fax:  [phone]  Physical Therapy Evaluation  Patient Details  Name: [name] MRN: [id number] Date of Birth: [birth year] Referring Provider: Dr Naomie Dean    Encounter Date: 12/04/2017  PT End of Session - 12/04/17 1338    Visit Number  1    Number of Visits  16    Date for PT Re-Evaluation  01/29/18    Authorization Type  BCBS     PT Start Time  0802    PT Stop Time  0845    PT Time Calculation (min)  43 min    Activity Tolerance  Patient tolerated treatment well    Behavior During Therapy  [facility] Xxx for tasks assessed/performed       Past Medical History:  Diagnosis Date  . Anxiety   . Celiac sprue   . Headache    migraines  . TMJ arthralgia     Past Surgical History:  Procedure Laterality Date  . lymph node removal  2000    There were no vitals filed for this visit.   Subjective Assessment - 12/04/17 0814    Subjective  Patient began having migranes 1.5 years ago. She was on a ski trip but does not remmeber any hard falls. She can not think of any triggers. They usually start towards the end of the day. She has had botox which helps but wears off. She has also been to a chiropractor, acupuntureist, and a massage therapist. She has seen very little lasting benefit.     Currently in Pain?  Yes can have soreness in the neck     Pain Score  10-Worst pain ever    Pain Location  Head    Pain Orientation  Posterior;Left    Pain Descriptors / Indicators  Aching    Pain Type  Chronic pain    Pain Onset  More than a month ago    Pain Frequency  Constant    Aggravating Factors   can not pinbpoint an activity; at times she reports her head feels heavey     Pain Relieving Factors  rest     Effect of Pain on Daily Activities  difficulty perfromimgn ADL's          [facility] PT Assessment - 12/04/17 0001      Assessment   Medical Diagnosis   Migrane headaches     Referring Provider  Dr Naomie Dean     Onset Date/Surgical Date  -- 1.5 year piror     Hand Dominance  Right    Next MD Visit  2 months     Prior Therapy  No physical therapy; acupunture, massage,       Precautions   Precautions  None      Restrictions   Weight Bearing Restrictions  No      Balance Screen   Has the patient fallen in the past 6 months  No    Has the patient had a decrease in activity level because of a fear of falling?   No    Is the patient reluctant to leave their home because of a fear of falling?   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  Cognition   Overall Cognitive Status  Within Functional Limits for tasks assessed    Attention  Focused    Focused Attention  Appears intact    Memory  Appears intact    Awareness  Appears intact    Problem Solving  Appears intact      Observation/Other Assessments   Focus on Therapeutic Outcomes (FOTO)   49% limitation       Sensation   Light Touch  Appears Intact    Additional Comments  when she side bends her head she can hacve tigling       Coordination   Gross Motor Movements are Fluid and Coordinated  Yes    Fine Motor Movements are Fluid and Coordinated  Yes      Posture/Postural Control   Posture Comments  slight rounding of the shoulders       ROM / Strength   AROM / PROM / Strength  PROM;AROM;Strength      AROM   AROM Assessment Site  Cervical    Cervical Flexion  20    Cervical Extension  20    Cervical - Right Rotation  55    Cervical - Left Rotation  50      Strength   Overall Strength Comments  Overall shoulder strength 5/5     Strength Assessment Site  Shoulder      Palpation   Palpation comment  Spasming in bilateral upper traps left > R; Muslce tightness into cervical parapinals and sub occipital region      Special Tests    Other special tests  spurlings (-) bilateral                 Objective measurements completed on examination: See above findings.      [facility] Adult PT Treatment/Exercise - 12/04/17 0001      Neck Exercises: Seated   Other Seated Exercise  scap retraction 2x10       Neck Exercises: Stretches   Upper Trapezius Stretch Limitations  2x20 sec hold bilateral     Levator Stretch Limitations  2x20 sec hold bilateral        Trigger Point Dry Needling - 12/04/17 1557    Consent Given?  Yes    Education Handout Provided  No    Muscles Treated Upper Body  Longissimus;Upper trapezius    Upper Trapezius Response  Twitch reponse elicited;Palpable increased muscle length    Longissimus Response  Twitch response elicited see plan for respose            PT Education - 12/04/17 1338    Education Details  Symptom mangement, benefits and risks of TPDN j    Person(s) Educated  Patient    Methods  Explanation;Demonstration;Tactile cues;Verbal cues;Handout    Comprehension  Verbalized understanding;Returned demonstration;Verbal cues required;Tactile cues required       PT Short Term Goals - 12/04/17 1604      PT SHORT TERM GOAL #1   Title  Patient will increase bilateral cervical rotation and frward flexion by 10 degrees    Time  4    Period  Weeks    Status  New    Target Date  01/01/18      PT SHORT TERM GOAL #2   Title  Patient will be independnet with HEP     Time  4    Period  Weeks    Status  New    Target Date  01/01/18      PT  SHORT TERM GOAL #3   Title  Patient will report 1 headache per week by the fourth week of treatment     Time  4    Period  Weeks    Status  New        PT Long Term Goals - 12/04/17 1608      PT LONG TERM GOAL #1   Title  Patient will sit for 1 hour without self report of increased stiffness    Time  8    Period  Weeks    Status  New    Target Date  01/29/18      PT LONG TERM GOAL #2   Title  Patient will report a 75% overall  decrease in headaches     Time  8    Period  Weeks    Status  New    Target Date  01/29/18      PT LONG TERM GOAL #3   Title  Patient will demstrate a 33% limitation on FOTO     Time  8    Period  Weeks    Status  New    Target Date  01/29/18             Plan - 12/04/17 1339    Clinical Impression Statement  Patient is a 42 year old female with freqeuent migranes. She has muslce tightness in bilateral upper traps, into her cervical parapsinals, and sub occipital muscles. She has  good baseline posture but uses a dual screeen computer at work which may effect her work sitting posture. She also has muscular tightness in her left shoulder blade. She has limited cervical rotation and cervical extension. She would benefit from skilled therapy for trigger point dry needling as well as cervical stabilization and postural correction.     History and Personal Factors relevant to plan of care:  anxiety     Clinical Presentation  Evolving    Clinical Presentation due to:  Migrane headaches that are increasing in frequency     Clinical Decision Making  Low    PT Frequency  2x / week    PT Duration  8 weeks    PT Treatment/Interventions  ADLs/Self Care Home Management;Cryotherapy;Electrical Stimulation;Moist Heat;Ultrasound;Gait training;Stair training;Functional mobility training;Therapeutic activities;Therapeutic exercise;Patient/family education;Neuromuscular re-education;Manual techniques;Passive range of motion    PT Next Visit Plan  consdier needling sub-occipitals,cervical parapsinals, upper trap; levator, and sub scap; Patient at times becomes vaso vagal. She becase vaso vagal today with cervical parapsinal needling; She may benefit most form cervical stsabilization; consider chin tucks; scap retraction; shoulder extension. any scap stabilization exercises. She has had acupuncture in the past.      PT Home Exercise Plan  upper trap stretch, levator stretch, scap retraction     Consulted  and Agree with Plan of Care  Patient       Patient will benefit from skilled therapeutic intervention in order to improve the following deficits and impairments:  Pain, Postural dysfunction, Decreased activity tolerance, Decreased range of motion, Decreased strength, Decreased endurance, Difficulty walking  Visit Diagnosis: Cervicalgia  Other muscle spasm  Abnormal posture     Problem List Patient Active Problem List   Diagnosis Date Noted  . 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 Abdominal pain, epigastric 10/05/2014  . Migraine 10/20/2013  . Celiac sprue 05/05/2012    Dessie Coma  PT DPT  12/04/2017, 4:12 PM  [facility] [phone]  [address] [place], [place], [address] Phone: [phone]   Fax:  [phone]  Name: [name] MRN: [id number] Date of Birth: 26-Mar-1976

## 2017-12-11 DIAGNOSIS — G43909 Migraine, unspecified, not intractable, without status migrainosus: Secondary | ICD-10-CM | POA: Diagnosis not present

## 2017-12-11 DIAGNOSIS — H5213 Myopia, bilateral: Secondary | ICD-10-CM | POA: Diagnosis not present

## 2017-12-13 ENCOUNTER — Ambulatory Visit: Payer: BLUE CROSS/BLUE SHIELD | Admitting: Physical Therapy

## 2017-12-17 ENCOUNTER — Telehealth: Payer: Self-pay | Admitting: Physical Therapy

## 2017-12-17 ENCOUNTER — Ambulatory Visit: Payer: BLUE CROSS/BLUE SHIELD | Admitting: Physical Therapy

## 2017-12-17 NOTE — Telephone Encounter (Signed)
Pt called by PT to find out why she was not present for appt. Pt forgot and she will be rescheduled for 7:30 AM this Thursday at 6-27.  Pt was reminded of cancellation / attendance policy.   Garen LahLawrie Shonnie Poudrier, PT Certified Exercise Expert for the Aging Adult  12/17/17 8:41 AM Phone: [phone] Fax: [phone]

## 2017-12-19 ENCOUNTER — Other Ambulatory Visit: Payer: Self-pay

## 2017-12-19 ENCOUNTER — Encounter: Payer: Self-pay | Admitting: Physical Therapy

## 2017-12-19 ENCOUNTER — Ambulatory Visit: Payer: BLUE CROSS/BLUE SHIELD | Admitting: Physical Therapy

## 2017-12-19 DIAGNOSIS — M62838 Other muscle spasm: Secondary | ICD-10-CM | POA: Diagnosis not present

## 2017-12-19 DIAGNOSIS — R293 Abnormal posture: Secondary | ICD-10-CM

## 2017-12-19 DIAGNOSIS — M542 Cervicalgia: Secondary | ICD-10-CM | POA: Diagnosis not present

## 2017-12-19 NOTE — Therapy (Signed)
[facility] [address] [place], [place], [address] Phone: [phone]   Fax:  [phone]  Physical Therapy Treatment  Patient Details  Name: [name] MRN: [id number] Date of Birth: 04-06-1976 Referring Provider: Dr Naomie DeanAntonia Ahern    Encounter Date: 12/19/2017  PT End of Session - 12/19/17 [id number]    Visit Number  2   30 min appt   Number of Visits  16    Date for PT Re-Evaluation  01/29/18    Authorization Type  BCBS     PT Start Time  0730    PT Stop Time  0815    PT Time Calculation (min)  45 min       Past Medical History:  Diagnosis Date  . Anxiety   . Celiac sprue   . Headache    migraines  . TMJ arthralgia     Past Surgical History:  Procedure Laterality Date  . lymph node removal  2000    There were no vitals filed for this visit.  Subjective Assessment - 12/19/17 0732    Subjective  [name] showed me how to do exercises the other day and it helped.  I really want to do dry needling today. I tend to have a vaso vagal response Pt is 4/10 at initiation of RX and a 2/10 at end    Currently in Pain?  Yes    Pain Score  4     Pain Location  Head    Pain Descriptors / Indicators  Aching;Tingling    Pain Type  Chronic pain    Pain Onset  More than a month ago    Pain Frequency  Constant                       [facility] Adult PT Treatment/Exercise - 12/19/17 0740      Modalities   Modalities  Moist Heat      Moist Heat Therapy   Number Minutes Moist Heat  15 Minutes    Moist Heat Location  Cervical      Manual Therapy   Manual Therapy  Joint mobilization;Soft tissue mobilization;Taping    Manual therapy comments  skilled palpation with TPDN    Joint Mobilization  lateral PA mobs from left C-2 to C-5 grade 2/3    Soft tissue mobilization  bilateral cervical and sub occipital release , scalenes, upper trap and levator left side    McConnell  inhibition of upper trap      Neck Exercises:  Stretches   Upper Trapezius Stretch Limitations  3 x 20 sec hold    Levator Stretch Limitations  3 x 20 sec hold  pt corrected for wrong execution    Other Neck Stretches  supine deep neck flexion with towel on sub  occipital 2 set 10 sec 3 sec hold       Trigger Point Dry Needling - 12/19/17 0741    Consent Given?  Yes    Education Handout Provided  Yes    Muscles Treated Upper Body  Suboccipitals muscle group;Oblique capitus;Upper trapezius;Levator scapulae left side onlyTPDN and scalenes     Upper Trapezius Response  Twitch reponse elicited;Palpable increased muscle length    Oblique Capitus Response  Palpable increased muscle length    SubOccipitals Response  Palpable increased muscle length    Levator Scapulae Response  Twitch response elicited;Palpable increased muscle length    Longissimus Response  Twitch response elicited;Palpable increased muscle length  erector C2 toC-5           PT Education - 12/19/17 0759    Education Details  Education on TPDN, taping, anddeep neck flexor review of neck stretches    Person(s) Educated  Patient    Methods  Explanation;Demonstration;Tactile cues;Handout;Verbal cues    Comprehension  Verbalized understanding;Returned demonstration       PT Short Term Goals - 12/04/17 1604      PT SHORT TERM GOAL #1   Title  Patient will increase bilateral cervical rotation and frward flexion by 10 degrees    Time  4    Period  Weeks    Status  New    Target Date  01/01/18      PT SHORT TERM GOAL #2   Title  Patient will be independnet with HEP     Time  4    Period  Weeks    Status  New    Target Date  01/01/18      PT SHORT TERM GOAL #3   Title  Patient will report 1 headache per week by the fourth week of treatment     Time  4    Period  Weeks    Status  New        PT Long Term Goals - 12/04/17 1608      PT LONG TERM GOAL #1   Title  Patient will sit for 1 hour without self report of increased stiffness    Time  8    Period   Weeks    Status  New    Target Date  01/29/18      PT LONG TERM GOAL #2   Title  Patient will report a 75% overall decrease in headaches     Time  8    Period  Weeks    Status  New    Target Date  01/29/18      PT LONG TERM GOAL #3   Title  Patient will demstrate a 33% limitation on FOTO     Time  8    Period  Weeks    Status  New    Target Date  01/29/18            Plan - 12/19/17 1308    Clinical Impression Statement  Pt returns to clinic with 4/10 headache and tight cervical, suboccipital pain.  Pt consents to TPDN and is closely monitored throughout session.  Pt with hx of vaso vagal response but none noted today.  Pt with tight left scalenes, and left cervical erector spinas and suboccipital responding well to TPDN.  Pt reviewed neck stretches and corrected on improper execution.  Deep neck flexor stretch added as well as upper trap inhibition taping    Rehab Potential  Good    PT Frequency  2x / week    PT Duration  8 weeks    PT Treatment/Interventions  ADLs/Self Care Home Management;Cryotherapy;Electrical Stimulation;Moist Heat;Ultrasound;Gait training;Stair training;Functional mobility training;Therapeutic activities;Therapeutic exercise;Patient/family education;Neuromuscular re-education;Manual techniques;Passive range of motion    PT Next Visit Plan  consdier needling sub-occipitals,cervical parapsinals, upper trap; levator, and sub scap and left scalene; Patient at times becomes vaso vagal. She becase vaso vagal today with cervical parapsinal needling; She may benefit most form cervical stsabilization; consider chin tucks; scap retraction; shoulder extension. any scap stabilization exercises. She has had acupuncture in the past.      PT Home Exercise Plan  upper trap stretch, levator stretch, scap retraction  deep neck flexor    Consulted and Agree with Plan of Care  Patient       Patient will benefit from skilled therapeutic intervention in order to improve the  following deficits and impairments:  Pain, Postural dysfunction, Decreased activity tolerance, Decreased range of motion, Decreased strength, Decreased endurance, Difficulty walking  Visit Diagnosis: Cervicalgia  Other muscle spasm  Abnormal posture     Problem List Patient Active Problem List   Diagnosis Date Noted  . 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 Abdominal pain, epigastric 10/05/2014  . Migraine 10/20/2013  . Celiac sprue 05/05/2012   Garen Lah, PT Certified Exercise Expert for the Aging Adult  12/19/17 8:31 AM Phone: [phone] Fax: [phone]  [facility] [address] [place], [place], [address] Phone: [phone]   Fax:  [phone]  Name: [name] MRN: [id number] Date of Birth: 20-Sep-1975

## 2017-12-19 NOTE — Patient Instructions (Addendum)
Trigger Point Dry Needling  . What is Trigger Point Dry Needling (DN)? o DN is a physical therapy technique used to treat muscle pain and dysfunction. Specifically, DN helps deactivate muscle trigger points (muscle knots).  o A thin filiform needle is used to penetrate the skin and stimulate the underlying trigger point. The goal is for a local twitch response (LTR) to occur and for the trigger point to relax. No medication of any kind is injected during the procedure.   . What Does Trigger Point Dry Needling Feel Like?  o The procedure feels different for each individual patient. Some patients report that they do not actually feel the needle enter the skin and overall the process is not painful. Very mild bleeding may occur. However, many patients feel a deep cramping in the muscle in which the needle was inserted. This is the local twitch response.   Marland Kitchen. How Will I feel after the treatment? o Soreness is normal, and the onset of soreness may not occur for a few hours. Typically this soreness does not last longer than two days.  o Bruising is uncommon, however; ice can be used to decrease any possible bruising.  o In rare cases feeling tired or nauseous after the treatment is normal. In addition, your symptoms may get worse before they get better, this period will typically not last longer than 24 hours.   . What Can I do After My Treatment? o Increase your hydration by drinking more water for the next 24 hours. o You may place ice or heat on the areas treated that have become sore, however, do not use heat on inflamed or bruised areas. Heat often brings more relief post needling. o You can continue your regular activities, but vigorous activity is not recommended initially after the treatment for 24 hours. o DN is best combined with other physical therapy such as strengthening, stretching, and other therapies.      Garen LahLawrie Beardsley, PT Certified Exercise Expert for the Aging Adult  12/19/17  7:40 AM Phone: [phone] Fax: [phone]

## 2017-12-24 ENCOUNTER — Ambulatory Visit: Payer: BLUE CROSS/BLUE SHIELD | Attending: Neurology | Admitting: Physical Therapy

## 2017-12-24 DIAGNOSIS — M62838 Other muscle spasm: Secondary | ICD-10-CM | POA: Insufficient documentation

## 2017-12-24 DIAGNOSIS — M542 Cervicalgia: Secondary | ICD-10-CM | POA: Diagnosis not present

## 2017-12-24 DIAGNOSIS — R293 Abnormal posture: Secondary | ICD-10-CM | POA: Insufficient documentation

## 2017-12-25 ENCOUNTER — Encounter: Payer: Self-pay | Admitting: Physical Therapy

## 2017-12-25 NOTE — Therapy (Signed)
[facility] [address] [place], [place], [address] Phone: [phone]   Fax:  [phone]  Physical Therapy Treatment  Patient Details  Name: [name] MRN: [id number] Date of Birth: 1975-08-09 Referring Provider: Dr Naomie Dean    Encounter Date: 12/24/2017  PT End of Session - 12/25/17 1351    Visit Number  3    Number of Visits  16    Date for PT Re-Evaluation  01/29/18    Authorization Type  BCBS     PT Start Time  1634    PT Stop Time  1725    PT Time Calculation (min)  51 min    Activity Tolerance  Patient tolerated treatment well    Behavior During Therapy  [facility] for tasks assessed/performed       Past Medical History:  Diagnosis Date  . Anxiety   . Celiac sprue   . Headache    migraines  . TMJ arthralgia     Past Surgical History:  Procedure Laterality Date  . lymph node removal  2000    There were no vitals filed for this visit.  Subjective Assessment - 12/25/17 1346    Subjective  Patient reports that over the weekend she had 2 migranes. She has been working on her exercises and her stretches.     Currently in Pain?  Yes    Pain Score  3     Pain Location  Neck    Pain Descriptors / Indicators  Sore    Pain Type  Chronic pain    Pain Onset  More than a month ago    Pain Frequency  Constant    Aggravating Factors   can not pinpoint activity     Pain Relieving Factors  rest     Effect of Pain on Daily Activities  difficulty perfroming ADL's                        [facility] Adult PT Treatment/Exercise - 12/25/17 0001      Self-Care   Self-Care  Posture    Posture  reviewed the anatomy of the muscualture around the cervical spine and the improtance of posture. Reviewed proper posture for work and household activity.              PT Education - 12/25/17 1350    Education Details  reviewed the importance of posture     Person(s) Educated  Patient    Methods   Explanation;Demonstration;Tactile cues;Verbal cues    Comprehension  Returned demonstration;Verbal cues required;Verbalized understanding;Tactile cues required       PT Short Term Goals - 12/25/17 1357      PT SHORT TERM GOAL #1   Title  Patient will increase bilateral cervical rotation and frward flexion by 10 degrees    Time  4    Period  Weeks    Status  On-going      PT SHORT TERM GOAL #2   Title  Patient will be independnet with HEP     Time  4    Period  Weeks    Status  On-going      PT SHORT TERM GOAL #3   Title  Patient will report 1 headache per week by the fourth week of treatment     Time  4    Period  Weeks    Status  On-going        PT Long Term  Goals - 12/04/17 1608      PT LONG TERM GOAL #1   Title  Patient will sit for 1 hour without self report of increased stiffness    Time  8    Period  Weeks    Status  New    Target Date  01/29/18      PT LONG TERM GOAL #2   Title  Patient will report a 75% overall decrease in headaches     Time  8    Period  Weeks    Status  New    Target Date  01/29/18      PT LONG TERM GOAL #3   Title  Patient will demstrate a 33% limitation on FOTO     Time  8    Period  Weeks    Status  New    Target Date  01/29/18            Plan - 12/25/17 1352    Clinical Impression Statement  Pateint tolerated needling of the upper trap and cervical parapspinals well. She had no increase in pain with treatment. Therapy reviewed the improtance of posture and reviewed postural exercises with the patient. She rewquired min cuing for proper chin tuck. Therapy focused on manual therapy. she continues to have significant tightness of the sub-occipitals.     Clinical Presentation  Evolving    Clinical Decision Making  Low    Rehab Potential  Good    PT Frequency  2x / week    PT Duration  8 weeks    PT Treatment/Interventions  ADLs/Self Care Home Management;Cryotherapy;Electrical Stimulation;Moist Heat;Ultrasound;Gait  training;Stair training;Functional mobility training;Therapeutic activities;Therapeutic exercise;Patient/family education;Neuromuscular re-education;Manual techniques;Passive range of motion    PT Next Visit Plan  consdier needling sub-occipitals,cervical parapsinals, upper trap; levator, and sub scap and left scalene; Patient at times becomes vaso vagal. She becase vaso vagal today with cervical parapsinal needling; She may benefit most form cervical stsabilization; consider chin tucks; scap retraction; shoulder extension. any scap stabilization exercises. She has had acupuncture in the past.      PT Home Exercise Plan  upper trap stretch, levator stretch, scap retraction  deep neck flexor       Patient will benefit from skilled therapeutic intervention in order to improve the following deficits and impairments:  Pain, Postural dysfunction, Decreased activity tolerance, Decreased range of motion, Decreased strength, Decreased endurance, Difficulty walking  Visit Diagnosis: Cervicalgia  Other muscle spasm  Abnormal posture     Problem List Patient Active Problem List   Diagnosis Date Noted  . 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 Abdominal pain, epigastric 10/05/2014  . Migraine 10/20/2013  . Celiac sprue 05/05/2012    Dessie Comaavid J Dierdre Mccalip PT DPT  12/25/2017, 1:59 PM  [facility] [address] [place], [place], [address] Phone: [phone]   Fax:  [phone]  Name: [name] MRN: [id number] Date of Birth: 09-Oct-1975

## 2017-12-30 ENCOUNTER — Ambulatory Visit: Payer: BLUE CROSS/BLUE SHIELD | Admitting: Physical Therapy

## 2017-12-30 ENCOUNTER — Encounter: Payer: Self-pay | Admitting: Neurology

## 2017-12-31 ENCOUNTER — Telehealth: Payer: Self-pay | Admitting: Physical Therapy

## 2017-12-31 NOTE — Telephone Encounter (Signed)
Pt called concerned about whether she should pursue MRI or X ray. PT listened to concern and noted she has attended 3 visits of PT.  PT recommended completing conservative course of PT and then pursue re evaluation by her MD if necessary.  Pt agreed and will continue PT  Garen LahLawrie Beardsley, PT Certified Exercise Expert for the Aging Adult  12/31/17 11:16 AM Phone: [phone] Fax: [phone]

## 2018-01-08 ENCOUNTER — Ambulatory Visit: Payer: BLUE CROSS/BLUE SHIELD | Admitting: Physical Therapy

## 2018-01-08 DIAGNOSIS — R293 Abnormal posture: Secondary | ICD-10-CM | POA: Diagnosis not present

## 2018-01-08 DIAGNOSIS — M62838 Other muscle spasm: Secondary | ICD-10-CM | POA: Diagnosis not present

## 2018-01-08 DIAGNOSIS — M542 Cervicalgia: Secondary | ICD-10-CM | POA: Diagnosis not present

## 2018-01-08 NOTE — Therapy (Addendum)
[facility] [address] [place], [place], [address] Phone: [phone]   Fax:  [phone]  Physical Therapy Treatment  Patient Details  Name: [name] MRN: [id number] Date of Birth: May 18, 1976 Referring Provider: Dr Naomie Dean    Encounter Date: 01/08/2018  PT End of Session - 01/08/18 1727    Visit Number  4    Number of Visits  16    Date for PT Re-Evaluation  01/29/18    Authorization Type  BCBS     PT Start Time  1634    PT Stop Time  1717    PT Time Calculation (min)  43 min    Activity Tolerance  Patient tolerated treatment well    Behavior During Therapy  [facility] for tasks assessed/performed       Past Medical History:  Diagnosis Date  . Anxiety   . Celiac sprue   . Headache    migraines  . TMJ arthralgia     Past Surgical History:  Procedure Laterality Date  . lymph node removal  2000    There were no vitals filed for this visit.  Subjective Assessment - 01/08/18 1638    Subjective  "I am feeling pretty good today. Still a little tender."    Currently in Pain?  Yes    Pain Score  3     Pain Location  Neck    Pain Descriptors / Indicators  Tender;Sore    Pain Type  Chronic pain    Pain Onset  More than a month ago    Pain Frequency  Constant                       [facility] Adult PT Treatment/Exercise - 01/08/18 0001      Self-Care   Self-Care  -- 1st rib self mobilization x 3 sets      Manual Therapy   Manual therapy comments  skilled palpation with TPDN    Joint Mobilization  1st rib grade 3-4 with breathing technique    McConnell  inhibition of L upper trap      Neck Exercises: Stretches   Upper Trapezius Stretch  Right;Left;30 seconds;1 rep       Trigger Point Dry Needling - 01/08/18 1729    Consent Given?  Yes    Education Handout Provided  No    Muscles Treated Upper Body  Upper trapezius;Suboccipitals muscle group;Levator scapulae cervical paraspinals (all on L)     Upper Trapezius Response  Twitch reponse elicited    SubOccipitals Response  Twitch response elicited    Levator Scapulae Response  Twitch response elicited           PT Education - 01/08/18 1725    Education Details  HEP, TPDN, 1st rib mobilization, forward head positioning and increased weight of head    Person(s) Educated  Patient    Methods  Explanation;Handout;Demonstration    Comprehension  Verbalized understanding       PT Short Term Goals - 12/25/17 1357      PT SHORT TERM GOAL #1   Title  Patient will increase bilateral cervical rotation and frward flexion by 10 degrees    Time  4    Period  Weeks    Status  On-going      PT SHORT TERM GOAL #2   Title  Patient will be independnet with HEP     Time  4    Period  Weeks  Status  On-going      PT SHORT TERM GOAL #3   Title  Patient will report 1 headache per week by the fourth week of treatment     Time  4    Period  Weeks    Status  On-going        PT Long Term Goals - 12/04/17 1608      PT LONG TERM GOAL #1   Title  Patient will sit for 1 hour without self report of increased stiffness    Time  8    Period  Weeks    Status  New    Target Date  01/29/18      PT LONG TERM GOAL #2   Title  Patient will report a 75% overall decrease in headaches     Time  8    Period  Weeks    Status  New    Target Date  01/29/18      PT LONG TERM GOAL #3   Title  Patient will demstrate a 33% limitation on FOTO     Time  8    Period  Weeks    Status  New    Target Date  01/29/18            Plan - 01/08/18 1727    Clinical Impression Statement  Treatment focused on TPDN of upper trap, levator scap, cervical paraspinals and suboccipitals. Also performed 1st rib mobilizations and taught pt self mobilizations of the first rib. Education provided on posture and forward head position and relation to headaches. Pt tolerated treatment well.     PT Treatment/Interventions  ADLs/Self Care Home  Management;Cryotherapy;Electrical Stimulation;Moist Heat;Ultrasound;Gait training;Stair training;Functional mobility training;Therapeutic activities;Therapeutic exercise;Patient/family education;Neuromuscular re-education;Manual techniques;Passive range of motion    PT Next Visit Plan  consdier needling sub-occipitals,cervical parapsinals, upper trap; levator, and sub scap and left scalene; Patient at times becomes vaso vagal; no vaso vagal symptoms today with needling. She may benefit most form cervical stsabilization; consider chin tucks; scap retraction; shoulder extension. any scap stabilization exercises. She has had acupuncture in the past.      PT Home Exercise Plan  upper trap stretch, levator stretch, scap retraction  deep neck flexor    Consulted and Agree with Plan of Care  Patient       Patient will benefit from skilled therapeutic intervention in order to improve the following deficits and impairments:  Pain, Postural dysfunction, Decreased activity tolerance, Decreased range of motion, Decreased strength, Decreased endurance, Difficulty walking  Visit Diagnosis: Cervicalgia  Other muscle spasm  Abnormal posture     Problem List Patient Active Problem List   Diagnosis Date Noted  . 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 Abdominal pain, epigastric 10/05/2014  . Migraine 10/20/2013  . Celiac sprue 05/05/2012   Laurelyn SickleMadison J. Solon Alban, SPT 01/08/18 5:35 PM   [facility] [address] [place], [place], [address] Phone: [phone]   Fax:  [phone]  Name: Rosalie DoctorMegan P Mcdermott MRN: [id number] Date of Birth: 1976-01-21

## 2018-01-15 ENCOUNTER — Ambulatory Visit: Payer: BLUE CROSS/BLUE SHIELD | Admitting: Physical Therapy

## 2018-01-15 DIAGNOSIS — G43909 Migraine, unspecified, not intractable, without status migrainosus: Secondary | ICD-10-CM | POA: Diagnosis not present

## 2018-01-15 DIAGNOSIS — M62838 Other muscle spasm: Secondary | ICD-10-CM | POA: Diagnosis not present

## 2018-01-15 DIAGNOSIS — K5900 Constipation, unspecified: Secondary | ICD-10-CM | POA: Diagnosis not present

## 2018-01-15 DIAGNOSIS — M542 Cervicalgia: Secondary | ICD-10-CM

## 2018-01-15 DIAGNOSIS — F411 Generalized anxiety disorder: Secondary | ICD-10-CM | POA: Diagnosis not present

## 2018-01-15 DIAGNOSIS — R293 Abnormal posture: Secondary | ICD-10-CM | POA: Diagnosis not present

## 2018-01-16 ENCOUNTER — Encounter: Payer: Self-pay | Admitting: Physical Therapy

## 2018-01-16 NOTE — Therapy (Signed)
[facility] [address] [place], [place], [address] Phone: [phone]   Fax:  [phone]  Physical Therapy Treatment  Patient Details  Name: Rosalie DoctorMegan P Kappes MRN: [id number] Date of Birth: [birth year] Referring Provider: Dr Naomie DeanAntonia Ahern    Encounter Date: 01/15/2018  PT End of Session - 01/15/18 1153    Visit Number  5    Number of Visits  16    Date for PT Re-Evaluation  01/29/18    Authorization Type  BCBS     PT Start Time  1150    PT Stop Time  1232    PT Time Calculation (min)  42 min    Activity Tolerance  Patient tolerated treatment well    Behavior During Therapy  [facility] for tasks assessed/performed       Past Medical History:  Diagnosis Date  . Anxiety   . Celiac sprue   . Headache    migraines  . TMJ arthralgia     Past Surgical History:  Procedure Laterality Date  . lymph node removal  2000    There were no vitals filed for this visit.  Subjective Assessment - 01/15/18 1154    Subjective  Patientr reports the headaches are about the same. She feels like she may be a little looser but overall there is no real difference. She had a heaache last night. She has been more aware of her posture.     Limitations  Standing    Currently in Pain?  No/denies                               PT Education - 01/15/18 1337    Education Details  updated HEP    Person(s) Educated  Patient    Methods  Explanation;Demonstration;Handout;Verbal cues    Comprehension  Returned demonstration;Verbalized understanding       PT Short Term Goals - 01/16/18 1215      PT SHORT TERM GOAL #1   Title  Patient will increase bilateral cervical rotation and frward flexion by 10 degrees    Time  4    Period  Weeks    Status  On-going      PT SHORT TERM GOAL #2   Title  Patient will be independnet with HEP     Time  4    Period  Weeks    Status  On-going      PT SHORT TERM GOAL #3   Title  Patient  will report 1 headache per week by the fourth week of treatment     Time  4    Period  Weeks    Status  On-going        PT Long Term Goals - 12/04/17 1608      PT LONG TERM GOAL #1   Title  Patient will sit for 1 hour without self report of increased stiffness    Time  8    Period  Weeks    Status  New    Target Date  01/29/18      PT LONG TERM GOAL #2   Title  Patient will report a 75% overall decrease in headaches     Time  8    Period  Weeks    Status  New    Target Date  01/29/18      PT LONG TERM GOAL #3   Title  Patient will demstrate  a 33% limitation on FOTO     Time  8    Period  Weeks    Status  New    Target Date  01/29/18            Plan - 01/16/18 1212    Clinical Impression Statement  Therapy neeedled her upper trap, sub occipitals, and cervical paraspinaks. She had a good twtich response in each. Therapy added supine scapualr exercises. she had no increase in pain. She will be seen for 1 more visit. There dosent seem to be at this point any corelation between ther muscles and her headaches. Therapy will review POC next visit.     Clinical Presentation  Evolving    Clinical Decision Making  Low    Rehab Potential  Good    PT Frequency  2x / week    PT Duration  8 weeks    PT Treatment/Interventions  ADLs/Self Care Home Management;Cryotherapy;Electrical Stimulation;Moist Heat;Ultrasound;Gait training;Stair training;Functional mobility training;Therapeutic activities;Therapeutic exercise;Patient/family education;Neuromuscular re-education;Manual techniques;Passive range of motion    PT Next Visit Plan  consdier needling sub-occipitals,cervical parapsinals, upper trap; levator, and sub scap and left scalene; Patient at times becomes vaso vagal; no vaso vagal symptoms today with needling. She may benefit most form cervical stsabilization; consider chin tucks; scap retraction; shoulder extension. any scap stabilization exercises. She has had acupuncture in the  past.      PT Home Exercise Plan  upper trap stretch, levator stretch, scap retraction  deep neck flexor    Consulted and Agree with Plan of Care  Patient       Patient will benefit from skilled therapeutic intervention in order to improve the following deficits and impairments:  Pain, Postural dysfunction, Decreased activity tolerance, Decreased range of motion, Decreased strength, Decreased endurance, Difficulty walking  Visit Diagnosis: Cervicalgia  Other muscle spasm  Abnormal posture     Problem List Patient Active Problem List   Diagnosis Date Noted  . 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 Abdominal pain, epigastric 10/05/2014  . Migraine 10/20/2013  . Celiac sprue 05/05/2012    Dessie Coma PT DPT   01/16/2018, 12:25 PM  [facility] [address] [place], [place], [address] Phone: [phone]   Fax:  [phone]  Name: MELITZA METHENY MRN: [id number] Date of Birth: July 07, 1975

## 2018-01-21 ENCOUNTER — Ambulatory Visit: Payer: BLUE CROSS/BLUE SHIELD | Admitting: Physical Therapy

## 2018-01-24 DIAGNOSIS — L700 Acne vulgaris: Secondary | ICD-10-CM | POA: Diagnosis not present

## 2018-01-24 DIAGNOSIS — L818 Other specified disorders of pigmentation: Secondary | ICD-10-CM | POA: Diagnosis not present

## 2018-02-03 ENCOUNTER — Telehealth: Payer: Self-pay | Admitting: Neurology

## 2018-02-03 NOTE — Telephone Encounter (Signed)
I called to schedule refill request on the patient's Botox.

## 2018-02-04 ENCOUNTER — Ambulatory Visit (INDEPENDENT_AMBULATORY_CARE_PROVIDER_SITE_OTHER): Payer: BLUE CROSS/BLUE SHIELD | Admitting: Orthopaedic Surgery

## 2018-02-04 NOTE — Telephone Encounter (Signed)
I called [employer] to check status, the script is marked stat and pending.

## 2018-02-05 NOTE — Telephone Encounter (Signed)
I called to check status of the medication and it is still pending insurance.

## 2018-02-06 ENCOUNTER — Ambulatory Visit (INDEPENDENT_AMBULATORY_CARE_PROVIDER_SITE_OTHER): Payer: BLUE CROSS/BLUE SHIELD | Admitting: Orthopaedic Surgery

## 2018-02-06 ENCOUNTER — Ambulatory Visit (INDEPENDENT_AMBULATORY_CARE_PROVIDER_SITE_OTHER): Payer: Self-pay

## 2018-02-06 ENCOUNTER — Other Ambulatory Visit (INDEPENDENT_AMBULATORY_CARE_PROVIDER_SITE_OTHER): Payer: Self-pay | Admitting: Radiology

## 2018-02-06 ENCOUNTER — Encounter (INDEPENDENT_AMBULATORY_CARE_PROVIDER_SITE_OTHER): Payer: Self-pay | Admitting: Orthopaedic Surgery

## 2018-02-06 VITALS — BP 102/63 | HR 85 | Ht 64.0 in | Wt 115.0 lb

## 2018-02-06 DIAGNOSIS — M542 Cervicalgia: Secondary | ICD-10-CM

## 2018-02-06 MED ORDER — METHOCARBAMOL 500 MG PO TABS: 500.0000 mg | ORAL_TABLET | Freq: Two times a day (BID) | ORAL | 1 refills | Status: DC | PRN

## 2018-02-06 NOTE — Telephone Encounter (Signed)
I called to check status of the patients medication, it is still pending insurance.

## 2018-02-06 NOTE — Progress Notes (Signed)
Office Visit Note   Patient: [name]           Date of Birth: 12/09/1975           MRN: [id number] Visit Date: 02/06/2018              Requested by: Farris HasMorrow, Aaron, MD [address] [place], [place] [address] PCP: Farris HasMorrow, Aaron, MD   Assessment & Plan: Visit Diagnoses:  1. Neck pain   2. Cervicalgia     Plan: Long history of chronic migraine headaches with recent onset of neck pain some loss of normal lordosis by plain film.  No obvious neurologic deficit.  I think it is worth obtaining an MRI scan of her cervical spine to further evaluate possible association with headaches and because of her neck pain.  We will try Robaxin as a muscle relaxant  Follow-Up Instructions: Return after MRI c-spine.   Orders:  Orders Placed This Encounter  Procedures  . XR Cervical Spine 2 or 3 views   No orders of the defined types were placed in this encounter.     Procedures: No procedures performed   Clinical Data: No additional findings.   Subjective: Chief Complaint  Patient presents with  . New Patient (Initial Visit)    neck pain for 1 yr. no injury no xrays or injections. had dry needling ref from her neurologist  Mrs. Abigail ShyColeman is 42 years old and has a long complicated history of migraine headaches.  She has had migraine headaches since she was a teenager.  They miraculously resolved when she became pregnant with her son many years ago.  He recently have the recurred within the past year to year and a half.  She is being followed by the neurologist and presently is taking a long-acting gabapentin and Lexapro.  She has tried acupuncture, chiropractic treatment and physical therapy.  She is even had motor point areas of injection.  Had some trouble with her neck with stiffness and soreness and occasional referred pain along the base of her neck to the levator scapular and scapular region and thus visits the office for further evaluation.  She has not had any  numbness or tingling or referred pain no history of injury or trauma  HPI  Review of Systems  Constitutional: Negative for fatigue and fever.  HENT: Negative for ear pain.   Eyes: Negative for pain.  Respiratory: Negative for cough and shortness of breath.   Cardiovascular: Negative for leg swelling.  Gastrointestinal: Positive for constipation. Negative for diarrhea.  Genitourinary: Negative for difficulty urinating.  Musculoskeletal: Positive for back pain and neck pain.  Skin: Negative for rash.  Allergic/Immunologic: Positive for food allergies.  Neurological: Positive for weakness and numbness.  Hematological: Does not bruise/bleed easily.  Psychiatric/Behavioral: Negative for sleep disturbance.     Objective: Vital Signs: BP 102/63 (BP Location: Left Arm, Patient Position: Sitting, Cuff Size: Normal)   Pulse 85   Ht 5\' 4"  (1.626 m)   Wt 115 lb (52.2 kg)   BMI 19.74 kg/m   Physical Exam  Constitutional: She is oriented to person, place, and time. She appears well-developed and well-nourished.  HENT:  Mouth/Throat: Oropharynx is clear and moist.  Eyes: Pupils are equal, round, and reactive to light. EOM are normal.  Pulmonary/Chest: Effort normal.  Neurological: She is alert and oriented to person, place, and time.  Skin: Skin is warm and dry.  Psychiatric: She has a normal mood and affect. Her behavior  is normal.    Ortho Exam awake alert and oriented x3.  Comfortable sitting.  Able to touch her chin to her chest with some encouragement.  Neck extension lacks a few degrees to full extension but no referred pain to either upper extremity.  Did have some pain along the base of her neck in the interscapular region.  Nearly full rotation of the right and the left.  Some spasm at the base of the neck bilaterally to palpation.  No masses.  Grip and good release.  No pain with range of motion of either shoulder.  Deep tendon reflexes were symmetrical  Specialty Comments:  No  specialty comments available.  Imaging: Xr Cervical Spine 2 Or 3 Views  Result Date: 02/06/2018 Films of the cervical spine were obtained in 2 projections.  There is slight decrease in the normal doses.  There is a little flattening of the superior endplate of C5 to the disc spaces are all well-maintained.  The facet joints appear to be intact .no listhesis    PMFS History: Patient Active Problem List   Diagnosis Date Noted  . 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 Abdominal pain, epigastric 10/05/2014  . Migraine 10/20/2013  . Celiac sprue 05/05/2012   Past Medical History:  Diagnosis Date  . Anxiety   . Celiac sprue   . Headache    migraines  . TMJ arthralgia     Family History  Problem Relation Age of Onset  . Hypertension Mother   . Diabetes Mother   . Celiac disease Mother   . Coronary artery disease Maternal Grandfather   . Pulmonary fibrosis Father     Past Surgical History:  Procedure Laterality Date  . lymph node removal  2000   Social History   Occupational History  . Occupation: Surveyor, mineralsffice manager    Employer: [employer]  Tobacco Use  . Smoking status: Former Smoker    Last attempt to quit: 06/28/1999    Years since quitting: 18.6  . Smokeless tobacco: Never Used  Substance and Sexual Activity  . Alcohol use: No    Comment: 1-2/year  . Drug use: No  . Sexual activity: Not on file

## 2018-02-07 ENCOUNTER — Encounter: Payer: Self-pay | Admitting: Physical Therapy

## 2018-02-07 ENCOUNTER — Ambulatory Visit: Payer: BLUE CROSS/BLUE SHIELD | Attending: Neurology | Admitting: Physical Therapy

## 2018-02-07 DIAGNOSIS — M62838 Other muscle spasm: Secondary | ICD-10-CM | POA: Diagnosis not present

## 2018-02-07 DIAGNOSIS — M542 Cervicalgia: Secondary | ICD-10-CM | POA: Diagnosis not present

## 2018-02-07 DIAGNOSIS — R293 Abnormal posture: Secondary | ICD-10-CM | POA: Insufficient documentation

## 2018-02-07 NOTE — Telephone Encounter (Signed)
I called the pharmacy, medication was ready to ship yesterday 08/15. Patient has an Marine scientistoutstanding bill with the pharmacy, they will not ship the medication until she has paid it. They have tried reaching out to her with no success. I also tried calling the patient but she did not answer so I left her a VM asking her to call back.

## 2018-02-07 NOTE — Therapy (Addendum)
[place], [place], [address] Phone: [phone]   Fax:  [phone]  Physical Therapy Treatment/Discharge Summary  Patient Details  Name: [name] MRN: [id number] Date of Birth: [birth year] Referring Provider: Dr Sarina Ill    Encounter Date: 02/07/2018  PT End of Session - 02/07/18 0814    Visit Number  6    Number of Visits  16    Authorization Type  BC[employer]     PT Start Time  0804    PT Stop Time  1287   shortened session due to discharge   PT Time Calculation (min)  33 min    Activity Tolerance  Patient tolerated treatment well    Behavior During Therapy  [facility] for tasks assessed/performed        Past Medical History:  Diagnosis Date  . Anxiety   . Celiac sprue   . Headache    migraines  . TMJ arthralgia     Past Surgical History:  Procedure Laterality Date  . lymph node removal  2000    There were no vitals filed for this visit.  Subjective Assessment - 02/07/18 0804    Subjective  "I feel okay today. I feel really tight. It is more on my R side today."    Currently in Pain?  Yes    Pain Score  5     Pain Location  Neck    Pain Orientation  Posterior;Right    Pain Descriptors / Indicators  --   tight   Pain Type  Chronic pain    Pain Onset  More than a month ago    Pain Frequency  Constant    Aggravating Factors   cannot pinpoint activity    Pain Relieving Factors  rest         [facility] PT Assessment - 02/07/18 0001      Observation/Other Assessments   Focus on Therapeutic Outcomes (FOTO)   47% limited      AROM   AROM Assessment Site  Cervical    Cervical Flexion  32    Cervical Extension  28    Cervical - Right Side Bend  24    Cervical - Left Side Bend  34    Cervical - Right Rotation  47    Cervical - Left Rotation  49                   [facility] Adult PT Treatment/Exercise - 02/07/18 0001      Neck Exercises: Seated   Other Seated Exercise  scap  retraction 1x10       Manual Therapy   Soft tissue mobilization  manual suboccipital release, suboccipital release with tennis balls, progressing to nods and chin tucks      Neck Exercises: Stretches   Upper Trapezius Stretch  Right;Left;30 seconds;2 reps             PT Education - 02/07/18 0836    Education Details  HEP review, increasing reps/sets as exercises become easier, MTPr with theracane and tennis balls on suboccipitals    Person(s) Educated  Patient    Methods  Explanation    Comprehension  Verbalized understanding       PT Short Term Goals - 02/07/18 8676      PT SHORT TERM GOAL #1   Title  Patient will increase bilateral cervical rotation and frward flexion by 10 degrees    Baseline  flexion now 32, rotation  decreased in each direction    Status  On-going      PT SHORT TERM GOAL #2   Title  Patient will be independnet with HEP     Status  Achieved      PT SHORT TERM GOAL #3   Title  Patient will report 1 headache per week by the fourth week of treatment     Status  Achieved        PT Long Term Goals - 02/07/18 0813      PT LONG TERM GOAL #1   Title  Patient will sit for 1 hour without self report of increased stiffness    Status  On-going      PT LONG TERM GOAL #2   Title  Patient will report a 75% overall decrease in headaches     Baseline  pt reports 50% decrease in headaches.     Status  On-going      PT LONG TERM GOAL #3   Title  Patient will demstrate a 33% limitation on FOTO     Status  On-going        Plan - 02/07/18 1025    Clinical Impression Statement  Pt reassessed today. Slight increases in cervical flexion/extension, no change in rotation ROM. 2% increase in FOTO score. Based on minimal improvement, and progress toward goals pt discharged today. pt requested DN today but due to it being her last session, discussed how we do not perform interventions on the last visit because we cannot reassess or monitor her. Treatment focused on  review of HEP and education regarding increasing reps and sets when exercises become too easy. Discussed use of theracane and tennis balls for MTPr. Performed subocciptal release with tennis balls; pt responded well.     PT Treatment/Interventions  ADLs/Self Care Home Management;Cryotherapy;Electrical Stimulation;Moist Heat;Ultrasound;Gait training;Stair training;Functional mobility training;Therapeutic activities;Therapeutic exercise;Patient/family education;Neuromuscular re-education;Manual techniques;Passive range of motion    PT Next Visit Plan  pt d/c today    PT Home Exercise Plan  upper trap stretch, levator stretch, scap retraction  deep neck flexor; scap stab with band    Consulted and Agree with Plan of Care  Patient       Patient will benefit from skilled therapeutic intervention in order to improve the following deficits and impairments:  Pain, Postural dysfunction, Decreased activity tolerance, Decreased range of motion, Decreased strength, Decreased endurance, Difficulty walking  Visit Diagnosis: Cervicalgia  Other muscle spasm  Abnormal posture     Problem List Patient Active Problem List   Diagnosis Date Noted  . 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 Abdominal pain, epigastric 10/05/2014  . Migraine 10/20/2013  . Celiac sprue 05/05/2012    [name] 02/07/2018, 8:42 AM  [facility] [address] [place], [place], [address] Phone: [phone]   Fax:  [phone]  Name: [name] MRN: [id number] Date of Birth: 06-Sep-1975    PHYSICAL THERAPY DISCHARGE SUMMARY  Visits from Start of Care: 6  Current functional level related to goals / functional outcomes: See above   Remaining deficits: Moderate pain level, decreased cervical ROM   Education / Equipment: HEP, theraband, tennis balls Plan: Patient agrees to discharge.  Patient goals were partially  met. Patient is being discharged due to lack of progress.  ?????

## 2018-02-10 ENCOUNTER — Ambulatory Visit (INDEPENDENT_AMBULATORY_CARE_PROVIDER_SITE_OTHER): Payer: BLUE CROSS/BLUE SHIELD | Admitting: Neurology

## 2018-02-10 DIAGNOSIS — G43719 Chronic migraine without aura, intractable, without status migrainosus: Secondary | ICD-10-CM | POA: Diagnosis not present

## 2018-02-10 DIAGNOSIS — G43711 Chronic migraine without aura, intractable, with status migrainosus: Secondary | ICD-10-CM | POA: Diagnosis not present

## 2018-02-10 NOTE — Progress Notes (Signed)
Botox- 100 units x 2 vials Lot: [id number] Expiration: 10/2018 NDC: 5621-3086-570023-1145-01  Bacteriostatic 0.9% Sodium Chloride- 4mL total Lot: [id number] Expiration: 03/26/2019 NDC: 9528-4132-440409-1966-02  Dx: [id number] S/P

## 2018-02-10 NOTE — Progress Notes (Signed)
Interval history: Out of 30 days, she has had 2 migraines which is a drastic improvement. >75% decrease in frequency. She has neck pain will order dry needling and have her back in 2 weeks. Did a few units in the bilateral mastoids. +eyes, also did extra in levator scapulae especially on the left where she has more trouble with trap and LS pain. Doing great on the Gralise. Takes rescue medication 1x a week significant improvement from baseline of daily migraines now down to 4 migraine days a month > 90% improvement. She is happy with only 4 migraine days a month even though they may severely impact that day and she has to sleep, discussed laying aimovig (she has severe constipation) or Emgality but she declines at this time.  Consent Form Botulism Toxin Injection For Chronic Migraine  Botulism toxin has been approved by the Federal drug administration for treatment of chronic migraine. Botulism toxin does not cure chronic migraine and it may not be effective in some patients.  The administration of botulism toxin is accomplished by injecting a small amount of toxin into the muscles of the neck and head. Dosage must be titrated for each individual. Any benefits resulting from botulism toxin tend to wear off after 3 months with a repeat injection required if benefit is to be maintained. Injections are usually done every 3-4 months with maximum effect peak achieved by about 2 or 3 weeks. Botulism toxin is expensive and you should be sure of what costs you will incur resulting from the injection.  The side effects of botulism toxin use for chronic migraine may include:   -Transient, and usually mild, facial weakness with facial injections  -Transient, and usually mild, head or neck weakness with head/neck injections  -Reduction or loss of forehead facial animation due to forehead muscle              weakness  -Eyelid drooping  -Dry eye  -Pain at the site of injection or bruising at the site of  injection  -Double vision  -Potential unknown long term risks  Contraindications: You should not have Botox if you are pregnant, nursing, allergic to albumin, have an infection, skin condition, or muscle weakness at the site of the injection, or have myasthenia gravis, Lambert-Eaton syndrome, or ALS.  It is also possible that as with any injection, there may be an allergic reaction or no effect from the medication. Reduced effectiveness after repeated injections is sometimes seen and rarely infection at the injection site may occur. All care will be taken to prevent these side effects. If therapy is given over a long time, atrophy and wasting in the muscle injected may occur. Occasionally the patient's become refractory to treatment because they develop antibodies to the toxin. In this event, therapy needs to be modified.  I have read the above information and consent to the administration of botulism toxin.    ______________  _____   _________________  Patient signature     Date   Witness signature       BOTOX PROCEDURE NOTE FOR MIGRAINE HEADACHE    Contraindications and precautions discussed with patient(above). Aseptic procedure was observed and patient tolerated procedure. Procedure performed by Dr. Artemio Alyoni Ahern  The condition has existed for more than 6 months, and pt does not have a diagnosis of ALS, Myasthenia Gravis or Lambert-Eaton Syndrome. Risks and benefits of injections discussed and pt agrees to proceed with the procedure. Written consent obtained  These injections are medically necessary.  He receives good benefits from these injections. These injections do not cause sedations or hallucinations which the oral therapies may cause.  Indication/Diagnosis: chronic migraine BOTOX(J0585) injection was performed according to protocol by [employer]. 200 units of BOTOX was dissolved into 4 cc NS.  NDC: [address]-7800023-1145-01  Type of toxin: Botox  Description of procedure:  The  patient was placed in a sitting position. The standard protocol was used for Botox as follows, with 5 units of Botox injected at each site:   -Procerus muscle, midline injection  -Corrugator muscle, bilateral injection  -Frontalis muscle, bilateral injection, with 2 sites each side, medial injection was performed in the upper one third of the frontalis muscle, in the region vertical from the medial inferior edge of the superior orbital rim. The lateral injection was again in the upper one third of the forehead vertically above the lateral limbus of the cornea, 1.5 cm lateral to the medial injection site.  -Temporalis muscle injection, 4 sites, bilaterally. The first injection was 3 cm above the tragus of the ear, second injection site was 1.5 cm to 3 cm up from the first injection site in line with the tragus of the ear. The third injection site was 1.5-3 cm forward between the first 2 injection sites. The fourth injection site was 1.5 cm posterior to the second injection site.  -Occipitalis muscle injection, 3 sites, bilaterally. The first injection was done one half way between the occipital protuberance and the tip of the mastoid process behind the ear. The second injection site was done lateral and superior to the first, 1 fingerbreadth from the first injection. The third injection site was 1 fingerbreadth superiorly and medially from the first injection site.  -Cervical paraspinal muscle injection, 2 sites, bilateral knee first injection site was 1 cm from the midline of the cervical spine, 3 cm inferior to the lower border of the occipital protuberance. The second injection site was 1.5 cm superiorly and laterally to the first injection site.  -Trapezius muscle injection was performed at 3 sites, bilaterally. The first injection site was in the upper trapezius muscle halfway between the inflection point of the neck, and the acromion. The second injection site was one half way between the  acromion and the first injection site. The third injection was done between the first injection site and the inflection point of the neck.   Will return for repeat injection in 3 months.   A 200 unit sof Botox was used, 155 units were injected, the rest of the Botox was wasted. The patient tolerated the procedure well, there were no complications of the above procedure.

## 2018-02-12 ENCOUNTER — Ambulatory Visit
Admission: RE | Admit: 2018-02-12 | Discharge: 2018-02-12 | Disposition: A | Discharge status: Home / Self Care | Payer: BLUE CROSS/BLUE SHIELD | Source: Ambulatory Visit | Attending: Orthopaedic Surgery | Admitting: Orthopaedic Surgery

## 2018-02-12 DIAGNOSIS — M542 Cervicalgia: Secondary | ICD-10-CM

## 2018-02-14 ENCOUNTER — Encounter (INDEPENDENT_AMBULATORY_CARE_PROVIDER_SITE_OTHER): Payer: Self-pay | Admitting: Orthopaedic Surgery

## 2018-02-14 ENCOUNTER — Ambulatory Visit (INDEPENDENT_AMBULATORY_CARE_PROVIDER_SITE_OTHER): Payer: BLUE CROSS/BLUE SHIELD | Admitting: Orthopaedic Surgery

## 2018-02-14 VITALS — BP 107/69 | HR 74 | Ht 64.0 in | Wt 115.0 lb

## 2018-02-14 DIAGNOSIS — M542 Cervicalgia: Secondary | ICD-10-CM | POA: Diagnosis not present

## 2018-02-14 NOTE — Progress Notes (Signed)
Office Visit Note   Patient: [name] DoctorMegan P Estala           Date of Birth: 1975/09/13           MRN: [id number] Visit Date: 02/14/2018              Requested by: Farris HasMorrow, Aaron, MD [address] [place], [place] [address] PCP: Farris HasMorrow, Aaron, MD   Assessment & Plan: Visit Diagnoses:  1. 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  This certainly does not appear to be associated with her migraine headaches.  No referred pain to either upper extremity.  I will suggest a course of exercises with cervical spine instructions. .  She has diclofenac and Robaxin at home.  Will reevaluate over time we could always consider cervical spine injections at C5-6  Follow-Up Instructions: Return if symptoms worsen or fail to improve.   Orders:  No orders of the defined types were placed in this encounter.  No orders of the defined types were placed in this encounter.     Procedures: No procedures performed   Clinical Data: No additional findings.   Subjective: No chief complaint on file. No change in symptoms since last evaluation.  MRI scan was obtained demonstrating some degenerative changes at C5-6 and mild protruding disc.  [name] does not experience any upper extremity radicular pain.  Has history of chronic migraines taking numerous medicines as outlined.  HPI  Review of Systems  Constitutional: Negative for fatigue and fever.  HENT: Negative for ear pain.   Eyes: Negative for pain.  Respiratory: Negative for cough and shortness of breath.   Cardiovascular: Negative for leg swelling.  Gastrointestinal: Negative for constipation and diarrhea.  Genitourinary: Negative for difficulty urinating.  Musculoskeletal: Positive for neck stiffness. Negative for back pain.  Skin: Negative for rash.  Allergic/Immunologic: Positive for food allergies.  Neurological: Positive for weakness. Negative for  numbness.  Hematological: Does not bruise/bleed easily.  Psychiatric/Behavioral: Negative for sleep disturbance.     Objective: Vital Signs: BP 107/69 (BP Location: Left Arm, Patient Position: Sitting, Cuff Size: Normal)   Pulse 74   Ht 5\' 4"  (1.626 m)   Wt 115 lb (52.2 kg)   BMI 19.74 kg/m   Physical Exam  Constitutional: She is oriented to person, place, and time. She appears well-developed and well-nourished.  HENT:  Mouth/Throat: Oropharynx is clear and moist.  Eyes: Pupils are equal, round, and reactive to light. EOM are normal.  Pulmonary/Chest: Effort normal.  Neurological: She is alert and oriented to person, place, and time.  Skin: Skin is warm and dry.  Psychiatric: She has a normal mood and affect. Her behavior is normal.    Ortho Exam Awake alert and oriented x3.  No loss of motion of cervical spine.  Some mild discomfort posteriorly but no masses. no obvious spasm today.  Neurovascular exam intact.  No loss of motion.  Neurovascular exam appears to be intact to both upper extremities.  No shoulder pain. No specialty comments available.  Imaging: No results found.   PMFS History: Patient Active Problem List   Diagnosis Date Noted  . Cervicalgia 02/14/2018  . 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 Abdominal pain, epigastric 10/05/2014  . Migraine 10/20/2013  . Celiac sprue 05/05/2012   Past Medical History:  Diagnosis Date  . Anxiety   . Celiac sprue   .  Headache    migraines  . TMJ arthralgia     Family History  Problem Relation Age of Onset  . Hypertension Mother   . Diabetes Mother   . Celiac disease Mother   . Coronary artery disease Maternal Grandfather   . Pulmonary fibrosis Father     Past Surgical History:  Procedure Laterality Date  . lymph node removal  2000   Social History   Occupational History  . Occupation: Surveyor, minerals: [employer]  Tobacco Use  .  Smoking status: Former Smoker    Last attempt to quit: 06/28/1999    Years since quitting: 18.6  . Smokeless tobacco: Never Used  Substance and Sexual Activity  . Alcohol use: No    Comment: 1-2/year  . Drug use: No  . Sexual activity: Not on file

## 2018-02-14 NOTE — Patient Instructions (Signed)
Neck Exercises Neck exercises can be important for many reasons:  They can help you to improve and maintain flexibility in your neck. This can be especially important as you age.  They can help to make your neck stronger. This can make movement easier.  They can reduce or prevent neck pain.  They may help your upper back.  Ask your health care provider which neck exercises would be best for you. Exercises Neck Press Repeat this exercise 10 times. Do it first thing in the morning and right before bed or as told by your health care provider. 1. Lie on your back on a firm bed or on the floor with a pillow under your head. 2. Use your neck muscles to push your head down on the pillow and straighten your spine. 3. Hold the position as well as you can. Keep your head facing up and your chin tucked. 4. Slowly count to 5 while holding this position. 5. Relax for a few seconds. Then repeat.  Isometric Strengthening Do a full set of these exercises 2 times a day or as told by your health care provider. 1. Sit in a supportive chair and place your hand on your forehead. 2. Push forward with your head and neck while pushing back with your hand. Hold for 10 seconds. 3. Relax. Then repeat the exercise 3 times. 4. Next, do thesequence again, this time putting your hand against the back of your head. Use your head and neck to push backward against the hand pressure. 5. Finally, do the same exercise on either side of your head, pushing sideways against the pressure of your hand.  Prone Head Lifts Repeat this exercise 5 times. Do this 2 times a day or as told by your health care provider. 1. Lie face-down, resting on your elbows so that your chest and upper back are raised. 2. Start with your head facing downward, near your chest. Position your chin either on or near your chest. 3. Slowly lift your head upward. Lift until you are looking straight ahead. Then continue lifting your head as far back as  you can stretch. 4. Hold your head up for 5 seconds. Then slowly lower it to your starting position.  Supine Head Lifts Repeat this exercise 8-10 times. Do this 2 times a day or as told by your health care provider. 1. Lie on your back, bending your knees to point to the ceiling and keeping your feet flat on the floor. 2. Lift your head slowly off the floor, raising your chin toward your chest. 3. Hold for 5 seconds. 4. Relax and repeat.  Scapular Retraction Repeat this exercise 5 times. Do this 2 times a day or as told by your health care provider. 1. Stand with your arms at your sides. Look straight ahead. 2. Slowly pull both shoulders backward and downward until you feel a stretch between your shoulder blades in your upper back. 3. Hold for 10-30 seconds. 4. Relax and repeat.  Contact a health care provider if:  Your neck pain or discomfort gets much worse when you do an exercise.  Your neck pain or discomfort does not improve within 2 hours after you exercise. If you have any of these problems, stop exercising right away. Do not do the exercises again unless your health care provider says that you can. Get help right away if:  You develop sudden, severe neck pain. If this happens, stop exercising right away. Do not do the exercises again unless your   health care provider says that you can. Exercises Neck Stretch  Repeat this exercise 3-5 times. 1. Do this exercise while standing or while sitting in a chair. 2. Place your feet flat on the floor, shoulder-width apart. 3. Slowly turn your head to the right. Turn it all the way to the right so you can look over your right shoulder. Do not tilt or tip your head. 4. Hold this position for 10-30 seconds. 5. Slowly turn your head to the left, to look over your left shoulder. 6. Hold this position for 10-30 seconds.  Neck Retraction Repeat this exercise 8-10 times. Do this 3-4 times a day or as told by your health care  provider. 1. Do this exercise while standing or while sitting in a sturdy chair. 2. Look straight ahead. Do not bend your neck. 3. Use your fingers to push your chin backward. Do not bend your neck for this movement. Continue to face straight ahead. If you are doing the exercise properly, you will feel a slight sensation in your throat and a stretch at the back of your neck. 4. Hold the stretch for 1-2 seconds. Relax and repeat.  This information is not intended to replace advice given to you by your health care provider. Make sure you discuss any questions you have with your health care provider. Document Released: 05/23/2015 Document Revised: 11/17/2015 Document Reviewed: 12/20/2014 Elsevier Interactive Patient Education  2018 [employer]vier Inc.  

## 2018-02-16 ENCOUNTER — Other Ambulatory Visit: Payer: BLUE CROSS/BLUE SHIELD

## 2018-02-17 ENCOUNTER — Ambulatory Visit (INDEPENDENT_AMBULATORY_CARE_PROVIDER_SITE_OTHER): Payer: BLUE CROSS/BLUE SHIELD | Admitting: Orthopaedic Surgery

## 2018-02-28 DIAGNOSIS — T1512XA Foreign body in conjunctival sac, left eye, initial encounter: Secondary | ICD-10-CM | POA: Diagnosis not present

## 2018-03-12 DIAGNOSIS — Z1231 Encounter for screening mammogram for malignant neoplasm of breast: Secondary | ICD-10-CM | POA: Diagnosis not present

## 2018-03-12 DIAGNOSIS — Z681 Body mass index (BMI) 19 or less, adult: Secondary | ICD-10-CM | POA: Diagnosis not present

## 2018-03-12 DIAGNOSIS — Z01419 Encounter for gynecological examination (general) (routine) without abnormal findings: Secondary | ICD-10-CM | POA: Diagnosis not present

## 2018-03-31 DIAGNOSIS — Z23 Encounter for immunization: Secondary | ICD-10-CM | POA: Diagnosis not present

## 2018-04-10 ENCOUNTER — Telehealth: Payer: Self-pay | Admitting: *Deleted

## 2018-04-10 NOTE — Telephone Encounter (Signed)
Patient cannot remember the name of the Specialty Pharmacy and would like a call back regarding this. [facility] [phone]

## 2018-04-10 NOTE — Telephone Encounter (Signed)
Spoke with patient and informed her that [facility] the [facility] for her Gralise and gave her the number. She was advised to call back if she has any issues and we can send it to another pharmacy.

## 2018-04-29 ENCOUNTER — Telehealth: Payer: Self-pay | Admitting: Neurology

## 2018-04-29 NOTE — Telephone Encounter (Signed)
I called to request a refill for the patients Botox at [facility] [phone]. DW

## 2018-05-05 NOTE — Telephone Encounter (Signed)
I called to check status and it is still pending. DW  °

## 2018-05-06 DIAGNOSIS — G43719 Chronic migraine without aura, intractable, without status migrainosus: Secondary | ICD-10-CM | POA: Diagnosis not present

## 2018-05-06 NOTE — Telephone Encounter (Signed)
Called and scheduled delivery for medication.

## 2018-05-09 ENCOUNTER — Ambulatory Visit (INDEPENDENT_AMBULATORY_CARE_PROVIDER_SITE_OTHER): Payer: BLUE CROSS/BLUE SHIELD | Admitting: Neurology

## 2018-05-09 DIAGNOSIS — G43711 Chronic migraine without aura, intractable, with status migrainosus: Secondary | ICD-10-CM

## 2018-05-09 MED ORDER — METHYLPREDNISOLONE 4 MG PO TBPK: ORAL_TABLET | ORAL | 1 refills | Status: DC

## 2018-05-09 NOTE — Progress Notes (Signed)
Botox- 100 units x 2 vials [id number] Expiration: 09/2020 NDC: 0023-1145-01  Bacteriostatic 0.9% Sodium Chloride- 4mL total Lot: [id number] Expiration: 03/26/2019 NDC: 0409-1966-02  Dx: [id number] S/P  

## 2018-05-09 NOTE — Progress Notes (Signed)
Consent Form Botulism Toxin Injection For Chronic Migraine   Interval history: Out of 30 days, she has had 2 migraines which is a drastic improvement. >75% decrease in frequency. She has neck pain ordered dry needling which helpes. +masseters, +temples, +OO, +LS. Doing great on the Gralise. Gave her some Horizant samples to try. Still takes rescue medication 1x a week significant improvement from baseline of daily migraines now down to 4 migraine days a month > 90% improvement. She is happy with only 4 migraine days a month even though they may severely impact that day and she has to sleep, discussed laying aimovig (she has severe constipation) or Emgality but she declines at this time doing great just on botox.   Reviewed orally with patient, additionally signature is on file:  Botulism toxin has been approved by the Federal drug administration for treatment of chronic migraine. Botulism toxin does not cure chronic migraine and it may not be effective in some patients.  The administration of botulism toxin is accomplished by injecting a small amount of toxin into the muscles of the neck and head. Dosage must be titrated for each individual. Any benefits resulting from botulism toxin tend to wear off after 3 months with a repeat injection required if benefit is to be maintained. Injections are usually done every 3-4 months with maximum effect peak achieved by about 2 or 3 weeks. Botulism toxin is expensive and you should be sure of what costs you will incur resulting from the injection.  The side effects of botulism toxin use for chronic migraine may include:   -Transient, and usually mild, facial weakness with facial injections  -Transient, and usually mild, head or neck weakness with head/neck injections  -Reduction or loss of forehead facial animation due to forehead muscle weakness  -Eyelid drooping  -Dry eye  -Pain at the site of injection or bruising at the site of injection  -Double  vision  -Potential unknown long term risks  Contraindications: You should not have Botox if you are pregnant, nursing, allergic to albumin, have an infection, skin condition, or muscle weakness at the site of the injection, or have myasthenia gravis, Lambert-Eaton syndrome, or ALS.  It is also possible that as with any injection, there may be an allergic reaction or no effect from the medication. Reduced effectiveness after repeated injections is sometimes seen and rarely infection at the injection site may occur. All care will be taken to prevent these side effects. If therapy is given over a long time, atrophy and wasting in the muscle injected may occur. Occasionally the patient's become refractory to treatment because they develop antibodies to the toxin. In this event, therapy needs to be modified.  I have read the above information and consent to the administration of botulism toxin.    BOTOX PROCEDURE NOTE FOR MIGRAINE HEADACHE    Contraindications and precautions discussed with patient(above). Aseptic procedure was observed and patient tolerated procedure. Procedure performed by Dr. Artemio Aly  The condition has existed for more than 6 months, and pt does not have a diagnosis of ALS, Myasthenia Gravis or Lambert-Eaton Syndrome.  Risks and benefits of injections discussed and pt agrees to proceed with the procedure.  Written consent obtained  These injections are medically necessary. Pt  receives good benefits from these injections. These injections do not cause sedations or hallucinations which the oral therapies may cause.  Description of procedure:  The patient was placed in a sitting position. The standard protocol was used for Botox  as follows, with 5 units of Botox injected at each site:   -Procerus muscle, midline injection  -Corrugator muscle, bilateral injection  -Frontalis muscle, bilateral injection, with 2 sites each side, medial injection was performed in the upper  one third of the frontalis muscle, in the region vertical from the medial inferior edge of the superior orbital rim. The lateral injection was again in the upper one third of the forehead vertically above the lateral limbus of the cornea, 1.5 cm lateral to the medial injection site.  - Levator Scapulae: 5 units bilaterally  -Temporalis muscle injection, 5 sites, bilaterally. The first injection was 3 cm above the tragus of the ear, second injection site was 1.5 cm to 3 cm up from the first injection site in line with the tragus of the ear. The third injection site was 1.5-3 cm forward between the first 2 injection sites. The fourth injection site was 1.5 cm posterior to the second injection site. 5th site laterally in the temporalis  muscleat the level of the outer canthus.  - Patient feels her clenching is a trigger for headaches. +5 units masseter bilaterally   - Patient feels the migraines are centered around the eyes +5 units bilaterally at the outer canthus in the orbicularis occuli  -Occipitalis muscle injection, 3 sites, bilaterally. The first injection was done one half way between the occipital protuberance and the tip of the mastoid process behind the ear. The second injection site was done lateral and superior to the first, 1 fingerbreadth from the first injection. The third injection site was 1 fingerbreadth superiorly and medially from the first injection site.  -Cervical paraspinal muscle injection, 2 sites, bilateral knee first injection site was 1 cm from the midline of the cervical spine, 3 cm inferior to the lower border of the occipital protuberance. The second injection site was 1.5 cm superiorly and laterally to the first injection site.  -Trapezius muscle injection was performed at 3 sites, bilaterally. The first injection site was in the upper trapezius muscle halfway between the inflection point of the neck, and the acromion. The second injection site was one half way between  the acromion and the first injection site. The third injection was done between the first injection site and the inflection point of the neck.   Will return for repeat injection in 3 months.   A 200 unit sof Botox was used, any Botox not injected was wasted. The patient tolerated the procedure well, there were no complications of the above procedure.

## 2018-07-18 DIAGNOSIS — F411 Generalized anxiety disorder: Secondary | ICD-10-CM | POA: Diagnosis not present

## 2018-07-18 DIAGNOSIS — K900 Celiac disease: Secondary | ICD-10-CM | POA: Diagnosis not present

## 2018-07-18 DIAGNOSIS — G43909 Migraine, unspecified, not intractable, without status migrainosus: Secondary | ICD-10-CM | POA: Diagnosis not present

## 2018-08-08 ENCOUNTER — Telehealth: Payer: Self-pay | Admitting: Neurology

## 2018-08-08 NOTE — Telephone Encounter (Signed)
Botox authorization forms have been submitted to [facility] via fax. DW

## 2018-08-11 NOTE — Telephone Encounter (Signed)
Pending Berk[facility] Harvey number is [id number]. DW

## 2018-08-12 ENCOUNTER — Ambulatory Visit (INDEPENDENT_AMBULATORY_CARE_PROVIDER_SITE_OTHER): Payer: BLUE CROSS/BLUE SHIELD | Admitting: Neurology

## 2018-08-12 ENCOUNTER — Telehealth: Payer: Self-pay | Admitting: Neurology

## 2018-08-12 DIAGNOSIS — G43711 Chronic migraine without aura, intractable, with status migrainosus: Secondary | ICD-10-CM

## 2018-08-12 MED ORDER — GABAPENTIN (ONCE-DAILY) 600 MG PO TABS: 1800.0000 mg | ORAL_TABLET | Freq: Every evening | ORAL | 6 refills | Status: DC | PRN

## 2018-08-12 NOTE — Progress Notes (Signed)
Botox- 100 units x 2 vials Lot: [id number] Expiration: 01/2021 NDC: 0023-1145-01  Bacteriostatic 0.9% Sodium Chloride- 4mL total Lot: [id number] Expiration: 03/26/2019 NDC: 0409-1966-02  Dx: G43.711 B/B   

## 2018-08-12 NOTE — Progress Notes (Signed)
Consent Form Botulism Toxin Injection For Chronic Migraine   Interval history 08/12/2018: Out of 30 days, she has had 1 migraines which is a drastic improvement. >95% decrease in frequency. She has neck pain ordered dry needling which helped. +masseters, +temples, +OO, +LS. Doing great on the Gralise.    Reviewed orally with patient, additionally signature is on file:  Botulism toxin has been approved by the Federal drug administration for treatment of chronic migraine. Botulism toxin does not cure chronic migraine and it may not be effective in some patients.  The administration of botulism toxin is accomplished by injecting a small amount of toxin into the muscles of the neck and head. Dosage must be titrated for each individual. Any benefits resulting from botulism toxin tend to wear off after 3 months with a repeat injection required if benefit is to be maintained. Injections are usually done every 3-4 months with maximum effect peak achieved by about 2 or 3 weeks. Botulism toxin is expensive and you should be sure of what costs you will incur resulting from the injection.  The side effects of botulism toxin use for chronic migraine may include:   -Transient, and usually mild, facial weakness with facial injections  -Transient, and usually mild, head or neck weakness with head/neck injections  -Reduction or loss of forehead facial animation due to forehead muscle weakness  -Eyelid drooping  -Dry eye  -Pain at the site of injection or bruising at the site of injection  -Double vision  -Potential unknown long term risks  Contraindications: You should not have Botox if you are pregnant, nursing, allergic to albumin, have an infection, skin condition, or muscle weakness at the site of the injection, or have myasthenia gravis, Lambert-Eaton syndrome, or ALS.  It is also possible that as with any injection, there may be an allergic reaction or no effect from the medication. Reduced  effectiveness after repeated injections is sometimes seen and rarely infection at the injection site may occur. All care will be taken to prevent these side effects. If therapy is given over a long time, atrophy and wasting in the muscle injected may occur. Occasionally the patient's become refractory to treatment because they develop antibodies to the toxin. In this event, therapy needs to be modified.  I have read the above information and consent to the administration of botulism toxin.    BOTOX PROCEDURE NOTE FOR MIGRAINE HEADACHE    Contraindications and precautions discussed with patient(above). Aseptic procedure was observed and patient tolerated procedure. Procedure performed by Dr. Artemio Aly  The condition has existed for more than 6 months, and pt does not have a diagnosis of ALS, Myasthenia Gravis or Lambert-Eaton Syndrome.  Risks and benefits of injections discussed and pt agrees to proceed with the procedure.  Written consent obtained  These injections are medically necessary. Pt  receives good benefits from these injections. These injections do not cause sedations or hallucinations which the oral therapies may cause.  Description of procedure:  The patient was placed in a sitting position. The standard protocol was used for Botox as follows, with 5 units of Botox injected at each site:   -Procerus muscle, midline injection  -Corrugator muscle, bilateral injection  -Frontalis muscle, bilateral injection, with 2 sites each side, medial injection was performed in the upper one third of the frontalis muscle, in the region vertical from the medial inferior edge of the superior orbital rim. The lateral injection was again in the upper one third of the forehead vertically  above the lateral limbus of the cornea, 1.5 cm lateral to the medial injection site.  - Levator Scapulae: 5 units bilaterally  -Temporalis muscle injection, 5 sites, bilaterally. The first injection was 3 cm  above the tragus of the ear, second injection site was 1.5 cm to 3 cm up from the first injection site in line with the tragus of the ear. The third injection site was 1.5-3 cm forward between the first 2 injection sites. The fourth injection site was 1.5 cm posterior to the second injection site. 5th site laterally in the temporalis  muscleat the level of the outer canthus.  - Patient feels her clenching is a trigger for headaches. +5 units masseter bilaterally   - Patient feels the migraines are centered around the eyes +5 units bilaterally at the outer canthus in the orbicularis occuli  -Occipitalis muscle injection, 3 sites, bilaterally. The first injection was done one half way between the occipital protuberance and the tip of the mastoid process behind the ear. The second injection site was done lateral and superior to the first, 1 fingerbreadth from the first injection. The third injection site was 1 fingerbreadth superiorly and medially from the first injection site.  -Cervical paraspinal muscle injection, 2 sites, bilateral knee first injection site was 1 cm from the midline of the cervical spine, 3 cm inferior to the lower border of the occipital protuberance. The second injection site was 1.5 cm superiorly and laterally to the first injection site.  -Trapezius muscle injection was performed at 3 sites, bilaterally. The first injection site was in the upper trapezius muscle halfway between the inflection point of the neck, and the acromion. The second injection site was one half way between the acromion and the first injection site. The third injection was done between the first injection site and the inflection point of the neck.   Will return for repeat injection in 3 months.   A 200 unit sof Botox was used, any Botox not injected was wasted. The patient tolerated the procedure well, there were no complications of the above procedure.

## 2018-08-12 NOTE — Telephone Encounter (Signed)
3 mo Botox inj  °

## 2018-08-13 NOTE — Telephone Encounter (Signed)
I called to schedule the patient but she did not answer so I left a VM asking her to call me back.  

## 2018-08-29 ENCOUNTER — Other Ambulatory Visit: Payer: Self-pay | Admitting: Neurology

## 2018-08-29 MED ORDER — FROVATRIPTAN SUCCINATE 2.5 MG PO TABS: ORAL_TABLET | ORAL | 3 refills | Status: DC

## 2018-08-29 MED ORDER — METHYLPREDNISOLONE 4 MG PO TBPK: ORAL_TABLET | ORAL | 1 refills | Status: DC

## 2018-09-01 ENCOUNTER — Telehealth: Payer: Self-pay | Admitting: *Deleted

## 2018-09-01 NOTE — Telephone Encounter (Signed)
Completed Frovatriptan 2.5 mg [place] on Cover My Meds. KEY: [id number]. I had spoken w/ pt on phone. She couldn't recall any other generic triptans tried besides the rizatriptan. She couldn't remember if imitrex was generic or brand. I listed tried/failed meds in [place]. Awaiting determination from [employer].

## 2018-09-02 ENCOUNTER — Encounter: Payer: Self-pay | Admitting: *Deleted

## 2018-09-02 NOTE — Telephone Encounter (Signed)
Per Cover My Meds, Frovatriptan 2.5 mg Approved on March 9  Effective from 09/01/2018 through 08/30/2021.  Sent pt [employer].

## 2018-09-12 ENCOUNTER — Other Ambulatory Visit: Payer: Self-pay | Admitting: Neurology

## 2018-09-12 DIAGNOSIS — G43711 Chronic migraine without aura, intractable, with status migrainosus: Secondary | ICD-10-CM

## 2018-09-19 ENCOUNTER — Telehealth: Payer: Self-pay | Admitting: *Deleted

## 2018-09-19 NOTE — Telephone Encounter (Addendum)
Received fax that Cambia 50 mg #10 denied. [facility] has program for discounted Cambia regardless of denial.

## 2018-11-03 ENCOUNTER — Telehealth: Payer: Self-pay | Admitting: Neurology

## 2018-11-03 NOTE — Telephone Encounter (Signed)
I called and spoke with the patient regarding changing her apt due to COVID-19. I confirmed that she has not shown any new symptoms, been exposed to the virus nor been running a fever. I also informed her she would need to wear a mask and gloves.  °

## 2018-11-04 DIAGNOSIS — R140 Abdominal distension (gaseous): Secondary | ICD-10-CM | POA: Diagnosis not present

## 2018-11-04 DIAGNOSIS — K5904 Chronic idiopathic constipation: Secondary | ICD-10-CM | POA: Diagnosis not present

## 2018-11-04 DIAGNOSIS — K900 Celiac disease: Secondary | ICD-10-CM | POA: Diagnosis not present

## 2018-11-06 ENCOUNTER — Ambulatory Visit (INDEPENDENT_AMBULATORY_CARE_PROVIDER_SITE_OTHER): Payer: BLUE CROSS/BLUE SHIELD | Admitting: Adult Health

## 2018-11-06 ENCOUNTER — Other Ambulatory Visit: Payer: Self-pay | Admitting: Neurology

## 2018-11-06 ENCOUNTER — Other Ambulatory Visit: Payer: Self-pay

## 2018-11-06 ENCOUNTER — Encounter: Payer: Self-pay | Admitting: Adult Health

## 2018-11-06 VITALS — BP 104/59 | HR 75 | Temp 98.4°F | Wt 121.4 lb

## 2018-11-06 DIAGNOSIS — G43711 Chronic migraine without aura, intractable, with status migrainosus: Secondary | ICD-10-CM

## 2018-11-06 MED ORDER — UBROGEPANT 50 MG PO TABS: 50.0000 mg | ORAL_TABLET | ORAL | 6 refills | Status: DC | PRN

## 2018-11-06 NOTE — Progress Notes (Signed)
B&B BOTOX 100u/vial (2 vials) NDC [id number].  LOT [id number], exp 03/2021. NaCL  NDC [id number] Exp 03-26-19.

## 2018-11-06 NOTE — Progress Notes (Signed)
ubroge

## 2018-11-06 NOTE — Progress Notes (Signed)
BOTOX PROCEDURE NOTE FOR MIGRAINE HEADACHE    Contraindications and precautions discussed with patient(above). Aseptic procedure was observed and patient tolerated procedure. Procedure performed by Butch PennyMegan Lezette Kitts, NP  The condition has existed for more than 6 months, and pt does not have a diagnosis of ALS, Myasthenia Gravis or Lambert-Eaton Syndrome.  Risks and benefits of injections discussed and pt agrees to proceed with the procedure.  Written consent obtained  These injections are medically necessary. She receives good benefits from these injections. These injections do not cause sedations or hallucinations which the oral therapies may cause.  Indication/Diagnosis: chronic migraine BOTOX(J0585) injection was performed according to protocol by [employer]. 200 units of BOTOX was dissolved into 4 cc NS.     Type of toxin: Botox  B&B BOTOX 100u/vial (2 vials) NDC [id number].  LOT [id number], exp 03/2021. NaCL  NDC [id number] LT [id number] Exp 03-26-19.   Description of procedure:  The patient was placed in a sitting position. The standard protocol was used for Botox as follows, with 5 units of Botox injected at each site:   -Procerus muscle, midline injection  -Corrugator muscle, bilateral injection  -Frontalis muscle, bilateral injection, with 2 sites each side, medial injection was performed in the upper one third of the frontalis muscle, in the region vertical from the medial inferior edge of the superior orbital rim. The lateral injection was again in the upper one third of the forehead vertically above the lateral limbus of the cornea, 1.5 cm lateral to the medial injection site.  -Temporalis muscle injection, 4 sites, bilaterally. The first injection was 3 cm above the tragus of the ear, second injection site was 1.5 cm to 3 cm up from the first injection site in line with the tragus of the ear. The third injection site was 1.5-3 cm forward between the first 2 injection sites.  The fourth injection site was 1.5 cm posterior to the second injection site.  - Patient feels her clenching is a trigger for headaches. +5 units masseter bilaterally. Injection completed by Dr. Lucia GaskinsAhern  - Patient feels the migraines are centered around the eyes +5 units bilaterally at the outer canthus in the orbicularis occuli-  Injection completed by Dr. Lucia GaskinsAhern  -Occipitalis muscle injection, 3 sites, bilaterally. The first injection was done one half way between the occipital protuberance and the tip of the mastoid process behind the ear. The second injection site was done lateral and superior to the first, 1 fingerbreadth from the first injection. The third injection site was 1 fingerbreadth superiorly and medially from the first injection site.  -Cervical paraspinal muscle injection, 2 sites, bilateral knee first injection site was 1 cm from the midline of the cervical spine, 3 cm inferior to the lower border of the occipital protuberance. The second injection site was 1.5 cm superiorly and laterally to the first injection site.  -Trapezius muscle injection was performed at 3 sites, bilaterally. The first injection site was in the upper trapezius muscle halfway between the inflection point of the neck, and the acromion. The second injection site was one half way between the acromion and the first injection site. The third injection was done between the first injection site and the inflection point of the neck.   Will return for repeat injection in 3 months.   A 200 unit sof Botox was used no Botox was wasted. The patient tolerated the procedure well, there were no complications of the above procedure.  Butch PennyMegan Grabiel Schmutz, MSN, NP-C 11/06/2018,  8:15 AM [facility] [address] [place], [place] [address] [phone]

## 2018-11-07 ENCOUNTER — Ambulatory Visit: Payer: BLUE CROSS/BLUE SHIELD | Admitting: Neurology

## 2018-11-10 ENCOUNTER — Telehealth: Payer: Self-pay | Admitting: Neurology

## 2018-11-10 NOTE — Telephone Encounter (Signed)
Trula Ore from [employer] calling to get insurance auth for [place] . Please call [phone]

## 2018-11-10 NOTE — Telephone Encounter (Signed)
Completed Ubrelvy 50 mg PA on Cover My Meds. KEY: [id number]. Awaiting [employer] determination.

## 2018-11-12 NOTE — Telephone Encounter (Addendum)
According to Cover My Meds, approved. Effective from 11/10/2018 through 02/01/2019.

## 2018-11-13 NOTE — Telephone Encounter (Signed)
Received Ubrelvy 50 mg approval letter. Faxed letter to [employer] in [place]. Received a receipt of confirmation.

## 2018-12-17 DIAGNOSIS — G43909 Migraine, unspecified, not intractable, without status migrainosus: Secondary | ICD-10-CM | POA: Diagnosis not present

## 2018-12-17 DIAGNOSIS — H5213 Myopia, bilateral: Secondary | ICD-10-CM | POA: Diagnosis not present

## 2018-12-23 DIAGNOSIS — R194 Change in bowel habit: Secondary | ICD-10-CM | POA: Diagnosis not present

## 2018-12-23 DIAGNOSIS — K5904 Chronic idiopathic constipation: Secondary | ICD-10-CM | POA: Diagnosis not present

## 2018-12-23 DIAGNOSIS — R140 Abdominal distension (gaseous): Secondary | ICD-10-CM | POA: Diagnosis not present

## 2018-12-23 DIAGNOSIS — Z1211 Encounter for screening for malignant neoplasm of colon: Secondary | ICD-10-CM | POA: Diagnosis not present

## 2019-01-16 DIAGNOSIS — F411 Generalized anxiety disorder: Secondary | ICD-10-CM | POA: Diagnosis not present

## 2019-01-16 DIAGNOSIS — K5900 Constipation, unspecified: Secondary | ICD-10-CM | POA: Diagnosis not present

## 2019-01-27 ENCOUNTER — Other Ambulatory Visit: Payer: Self-pay | Admitting: Neurology

## 2019-01-27 ENCOUNTER — Encounter: Payer: Self-pay | Admitting: *Deleted

## 2019-02-01 IMAGING — MR MR CERVICAL SPINE W/O CM
4 of 5 series · 25 of 48 positions shown · non-contrast
Comparison: Radiographs dated 02/06/2018

CLINICAL DATA: Left-sided neck pain radiating into the left
shoulder for 1 year. Cervicalgia.

EXAM:
MRI CERVICAL SPINE WITHOUT CONTRAST
TECHNIQUE: Multiplanar, multisequence MR imaging of the cervical spine was
performed. No intravenous contrast was administered.

[Series 3: T2 post-contrast · sagittal · 3.3mm · 0.37mm/px · 8 of 13 slices shown]
[im 1/13]
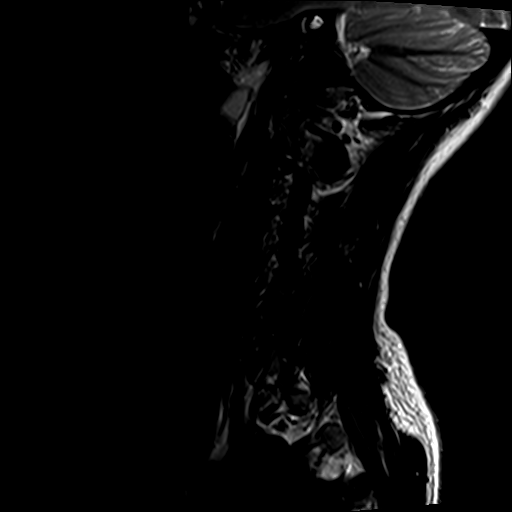
[im 2/13]
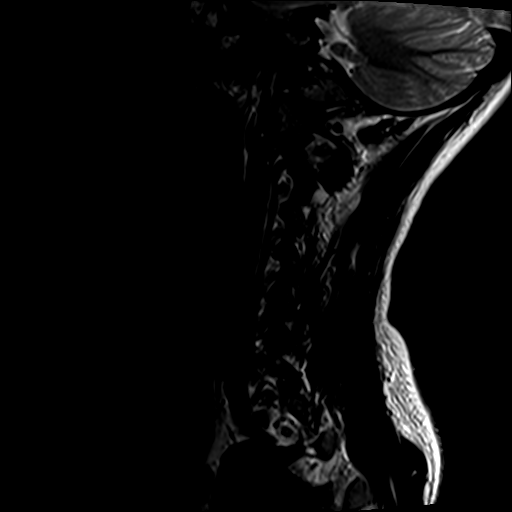
[im 4/13]
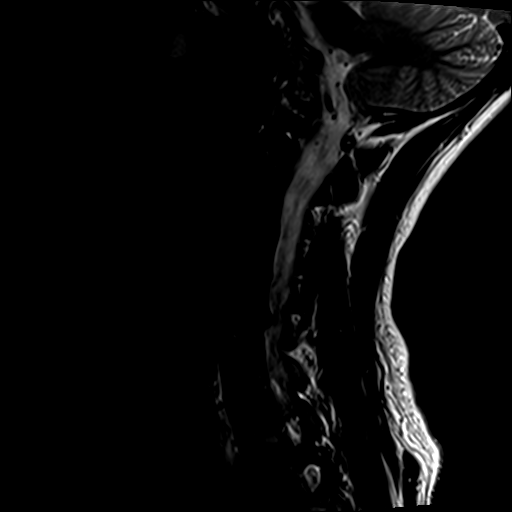
[im 6/13]
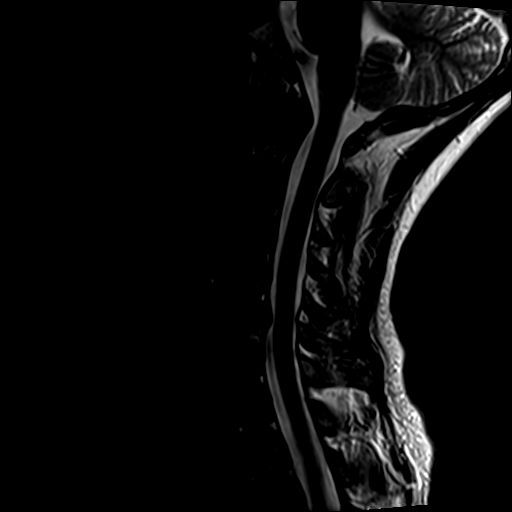
[im 7/13]
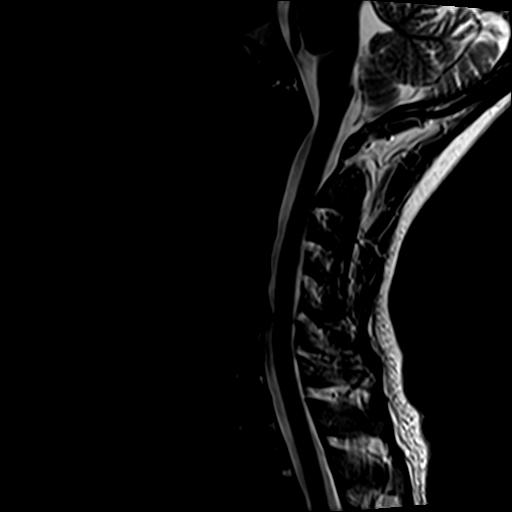
[im 9/13]
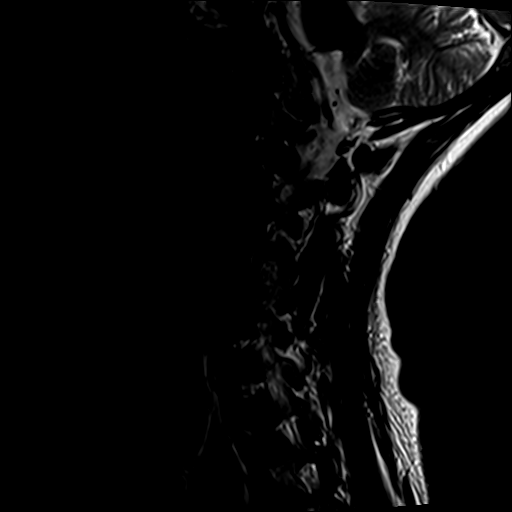
[im 11/13]
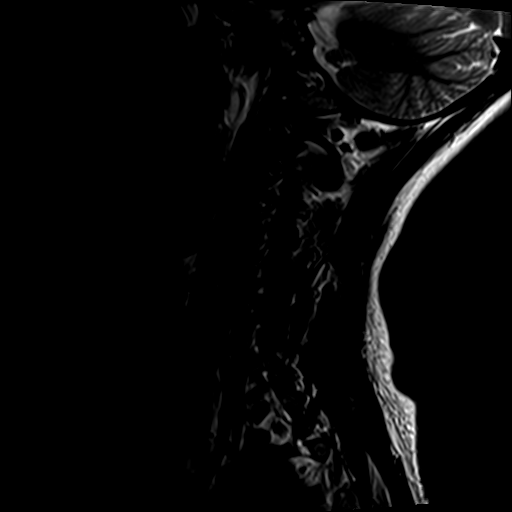
[im 13/13]
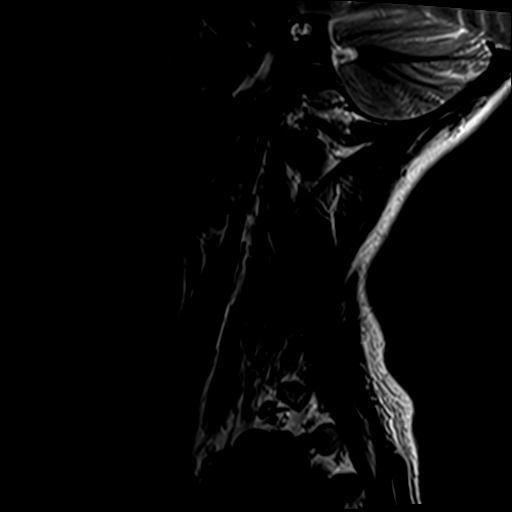

[Series 4: T1 · sagittal · 3.3mm · 0.37mm/px · 5 of 13 slices shown]
[im 1/13]
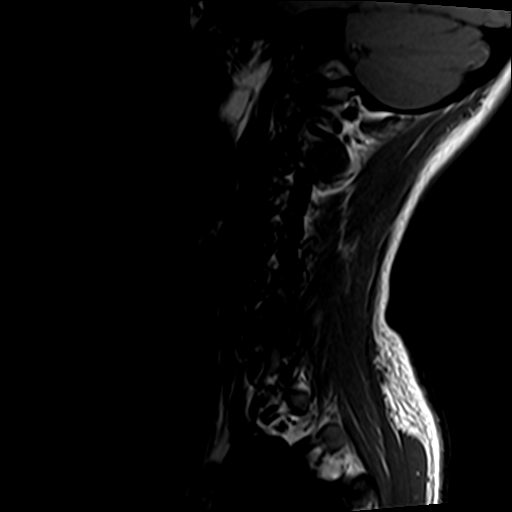
[im 3/13]
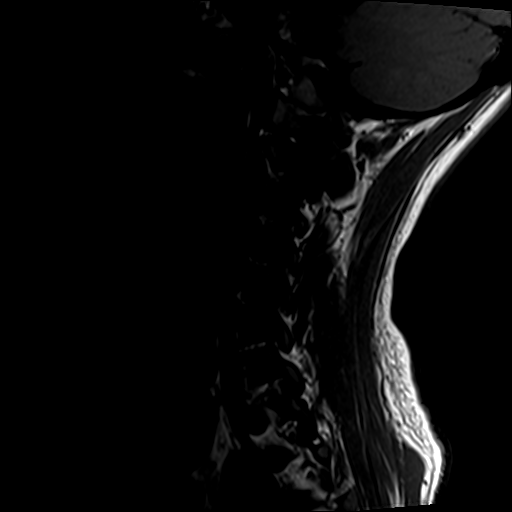
[im 5/13]
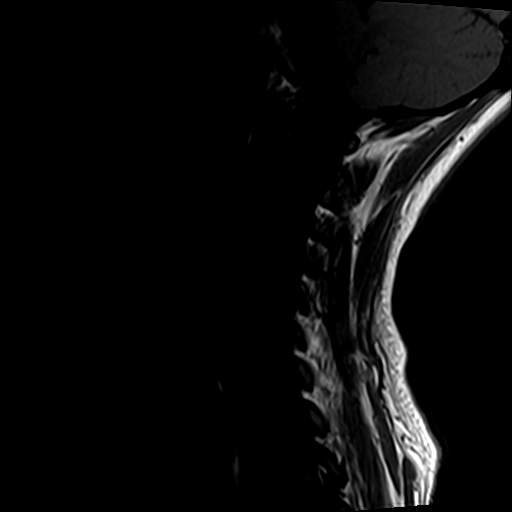
[im 7/13]
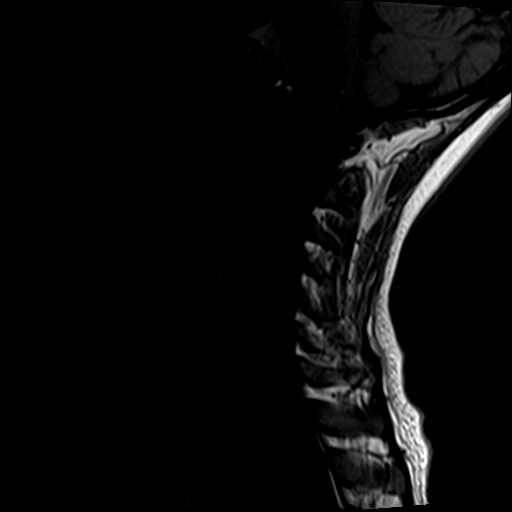
[im 11/13]
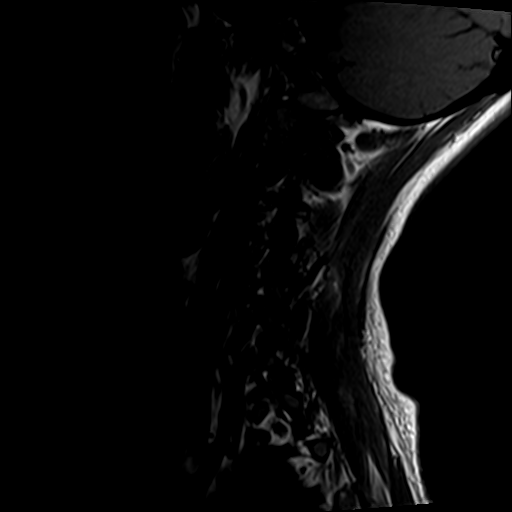

[Series 5: tir sag · sagittal · 3.3mm · 0.37mm/px · 3 of 13 slices shown]
[im 3/13]
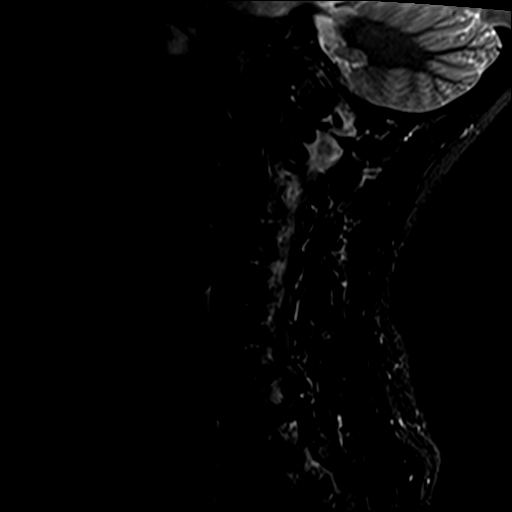
[im 7/13]
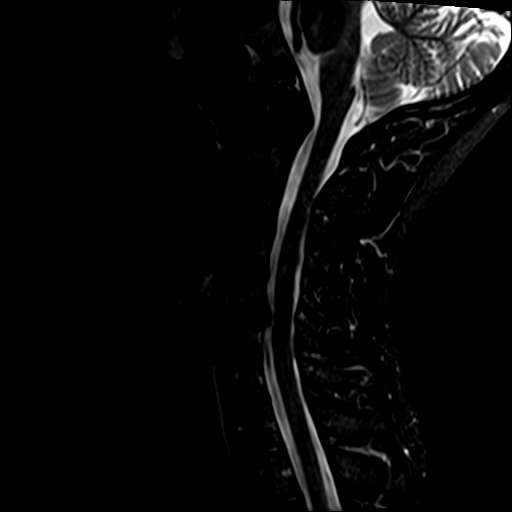
[im 11/13]
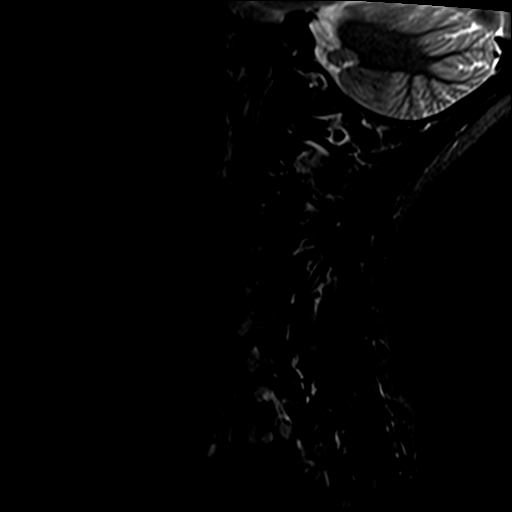

[Series 7: T2 · axial · 3.0mm · 0.70mm/px · z∈[-96,-9]mm · 9 of 25 slices shown]
[im 1/25]
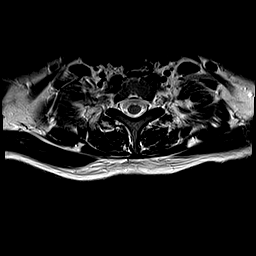
[im 5/25]
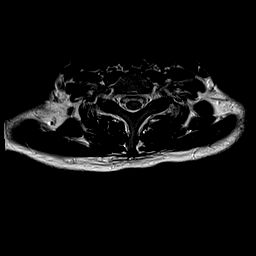
[im 9/25]
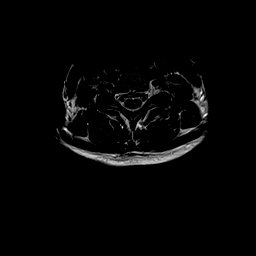
[im 11/25]
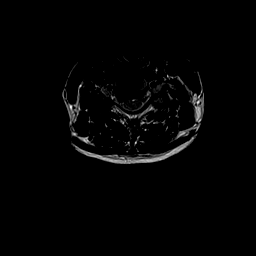
[im 13/25]
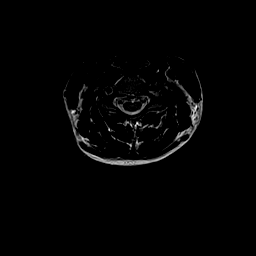
[im 15/25]
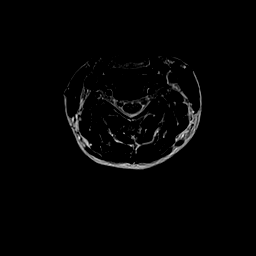
[im 17/25]
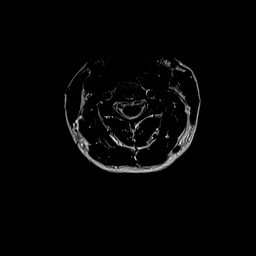
[im 21/25]
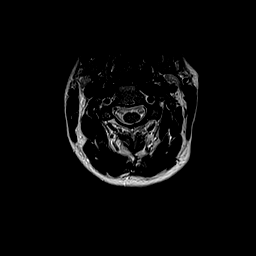
[im 25/25]
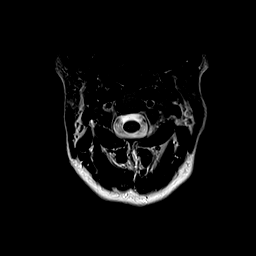

[25 of 48 positions shown; findings below may reference images not displayed]

FINDINGS: Alignment: Physiologic.

Vertebrae: No fracture, evidence of discitis, or bone lesion.

Cord: Normal signal and morphology.

Posterior Fossa, vertebral arteries, paraspinal tissues: Negative.

Disc levels:

C2-3: Normal.

C3-4: Normal.

C4-5: Normal.

C5-6: Small broad-based soft disc protrusion and slight disc space
narrowing with minimal encroachment upon the lateral recesses on the
right and left. No foraminal stenosis.

C6-7: Normal.

C7-T1: Normal.

There is no spinal or foraminal stenosis or facet arthritis in the
cervical spine.
IMPRESSION: 1. Degenerative disc disease at C5-6 with slight disc space
narrowing and a small broad-based disc protrusion slightly
encroaching upon both lateral recesses, best demonstrated on image
15 of series [DATE]. Otherwise, essentially normal MRI of the cervical spine.

## 2019-02-03 ENCOUNTER — Telehealth: Payer: Self-pay | Admitting: Neurology

## 2019-02-03 ENCOUNTER — Encounter: Payer: Self-pay | Admitting: *Deleted

## 2019-02-03 NOTE — Telephone Encounter (Signed)
I called patient regarding rescheduling 8/21 appointment due to office being closed. When patient calls back please schedule her for a held spot on 8/25 or next available.

## 2019-02-13 ENCOUNTER — Ambulatory Visit: Payer: BLUE CROSS/BLUE SHIELD | Admitting: Neurology

## 2019-02-17 ENCOUNTER — Telehealth: Payer: Self-pay | Admitting: Neurology

## 2019-02-17 ENCOUNTER — Other Ambulatory Visit: Payer: Self-pay

## 2019-02-17 ENCOUNTER — Ambulatory Visit (INDEPENDENT_AMBULATORY_CARE_PROVIDER_SITE_OTHER): Payer: BC Managed Care – PPO | Admitting: Neurology

## 2019-02-17 VITALS — Temp 98.2°F

## 2019-02-17 DIAGNOSIS — G43711 Chronic migraine without aura, intractable, with status migrainosus: Secondary | ICD-10-CM | POA: Diagnosis not present

## 2019-02-17 DIAGNOSIS — M542 Cervicalgia: Secondary | ICD-10-CM

## 2019-02-17 MED ORDER — NURTEC 75 MG PO TBDP: 75.0000 mg | ORAL_TABLET | Freq: Every day | ORAL | 6 refills | Status: DC | PRN

## 2019-02-17 MED ORDER — METHYLPREDNISOLONE ACETATE 80 MG/ML IJ SUSP
160.0000 mg | Freq: Once | INTRAMUSCULAR | Status: AC
  Administered 2019-02-17: 160 mg via INTRAMUSCULAR

## 2019-02-17 MED ORDER — METHYLPREDNISOLONE 4 MG PO TBPK: ORAL_TABLET | ORAL | 1 refills | Status: DC

## 2019-02-17 MED ORDER — CYCLOBENZAPRINE HCL 10 MG PO TABS: 10.0000 mg | ORAL_TABLET | Freq: Three times a day (TID) | ORAL | 3 refills | Status: DC | PRN

## 2019-02-17 NOTE — Progress Notes (Signed)
Consent Form Botulism Toxin Injection For Chronic Migraine   Interval history 02/17/2019: Out of 30 days, she has had 1 migraines which is a drastic improvement. >95% decrease in frequency. She has neck pain ordered dry needling which helped. +masseters, +temples, +OO, +LS. Doing great on the Gralise.  Bernita RaisinUbrelvy did not work. Will try Nurtec.   Meds ordered this encounter  Medications  . cyclobenzaprine (FLEXERIL) 10 MG tablet    Sig: Take 1 tablet (10 mg total) by mouth 3 (three) times daily as needed for muscle spasms.    Dispense:  90 tablet    Refill:  3  . methylPREDNISolone acetate (DEPO-MEDROL) injection 160 mg  . Rimegepant Sulfate (NURTEC) 75 MG TBDP    Sig: Take 75 mg by mouth daily as needed. For migraines. Take as close to onset of migraine as possible. One daily maximum.    Dispense:  10 tablet    Refill:  6  . methylPREDNISolone (MEDROL DOSEPAK) 4 MG TBPK tablet    Sig: Take the pills each morning together with food for 6 days.    Dispense:  21 tablet    Refill:  1     Reviewed orally with patient, additionally signature is on file:  Botulism toxin has been approved by the Federal drug administration for treatment of chronic migraine. Botulism toxin does not cure chronic migraine and it may not be effective in some patients.  The administration of botulism toxin is accomplished by injecting a small amount of toxin into the muscles of the neck and head. Dosage must be titrated for each individual. Any benefits resulting from botulism toxin tend to wear off after 3 months with a repeat injection required if benefit is to be maintained. Injections are usually done every 3-4 months with maximum effect peak achieved by about 2 or 3 weeks. Botulism toxin is expensive and you should be sure of what costs you will incur resulting from the injection.  The side effects of botulism toxin use for chronic migraine may include:   -Transient, and usually mild, facial weakness with  facial injections  -Transient, and usually mild, head or neck weakness with head/neck injections  -Reduction or loss of forehead facial animation due to forehead muscle weakness  -Eyelid drooping  -Dry eye  -Pain at the site of injection or bruising at the site of injection  -Double vision  -Potential unknown long term risks  Contraindications: You should not have Botox if you are pregnant, nursing, allergic to albumin, have an infection, skin condition, or muscle weakness at the site of the injection, or have myasthenia gravis, Lambert-Eaton syndrome, or ALS.  It is also possible that as with any injection, there may be an allergic reaction or no effect from the medication. Reduced effectiveness after repeated injections is sometimes seen and rarely infection at the injection site may occur. All care will be taken to prevent these side effects. If therapy is given over a long time, atrophy and wasting in the muscle injected may occur. Occasionally the patient's become refractory to treatment because they develop antibodies to the toxin. In this event, therapy needs to be modified.  I have read the above information and consent to the administration of botulism toxin.    BOTOX PROCEDURE NOTE FOR MIGRAINE HEADACHE    Contraindications and precautions discussed with patient(above). Aseptic procedure was observed and patient tolerated procedure. Procedure performed by Dr. Artemio Alyoni Xadrian Craighead  The condition has existed for more than 6 months, and pt does not  have a diagnosis of ALS, Myasthenia Gravis or Lambert-Eaton Syndrome.  Risks and benefits of injections discussed and pt agrees to proceed with the procedure.  Written consent obtained  These injections are medically necessary. Pt  receives good benefits from these injections. These injections do not cause sedations or hallucinations which the oral therapies may cause.  Description of procedure:  The patient was placed in a sitting position. The  standard protocol was used for Botox as follows, with 5 units of Botox injected at each site:   -Procerus muscle, midline injection  -Corrugator muscle, bilateral injection  -Frontalis muscle, bilateral injection, with 2 sites each side, medial injection was performed in the upper one third of the frontalis muscle, in the region vertical from the medial inferior edge of the superior orbital rim. The lateral injection was again in the upper one third of the forehead vertically above the lateral limbus of the cornea, 1.5 cm lateral to the medial injection site.  - Levator Scapulae: 5 units bilaterally  -Temporalis muscle injection, 5 sites, bilaterally. The first injection was 3 cm above the tragus of the ear, second injection site was 1.5 cm to 3 cm up from the first injection site in line with the tragus of the ear. The third injection site was 1.5-3 cm forward between the first 2 injection sites. The fourth injection site was 1.5 cm posterior to the second injection site. 5th site laterally in the temporalis  muscleat the level of the outer canthus.  - Patient feels her clenching is a trigger for headaches. +5 units masseter bilaterally   - Patient feels the migraines are centered around the eyes +5 units bilaterally at the outer canthus in the orbicularis occuli  -Occipitalis muscle injection, 3 sites, bilaterally. The first injection was done one half way between the occipital protuberance and the tip of the mastoid process behind the ear. The second injection site was done lateral and superior to the first, 1 fingerbreadth from the first injection. The third injection site was 1 fingerbreadth superiorly and medially from the first injection site.  -Cervical paraspinal muscle injection, 2 sites, bilateral knee first injection site was 1 cm from the midline of the cervical spine, 3 cm inferior to the lower border of the occipital protuberance. The second injection site was 1.5 cm superiorly and  laterally to the first injection site.  -Trapezius muscle injection was performed at 3 sites, bilaterally. The first injection site was in the upper trapezius muscle halfway between the inflection point of the neck, and the acromion. The second injection site was one half way between the acromion and the first injection site. The third injection was done between the first injection site and the inflection point of the neck.   Will return for repeat injection in 3 months.   A 200 unit sof Botox was used, any Botox not injected was wasted. The patient tolerated the procedure well, there were no complications of the above procedure.

## 2019-02-17 NOTE — Progress Notes (Signed)
Botox- 200 units x 1 vial Lot: [id number] Expiration: 08/2021 NDC: 6256-3893-73  Bacteriostatic 0.9% Sodium Chloride- 73mL total Lot: [id number] Expiration: 03/26/2019 NDC: 1157-2620-35  Dx: D97.416 B/B  Nerve block w/ steroid: Pt signed consent  0.5% Bupivocaine 2 mL LOT: [id number] EXP: 01/2020 NDC: 03212-248-25  2% Lidocaine 2 mL LOT: [id number] EXP: 12/24/2019 NDC: 0037-0488-89  Depomedrol 160 mg (80 mg/mL) LOT: [id number] EXP: 04/2019 NDC: 3888-2800-34

## 2019-02-17 NOTE — Progress Notes (Addendum)
Trigger point injections bilateral trapezius 20552 for musculoskeletal neck pain. Used handheld EMG machine.

## 2019-02-17 NOTE — Telephone Encounter (Signed)
3 mo Botox inj  °

## 2019-02-17 NOTE — Telephone Encounter (Signed)
[name], please call thanks

## 2019-02-17 NOTE — Patient Instructions (Addendum)
Rimege[employer]ant: Patient drug information Access [employer] here. Copyright [phone] [employer] All rights reserved. (For additional information see "Rimegepant: Drug information") Brand Names: [place]  Nurtec  What is this drug used for?   It is used to treat migraine headaches.  What do I need to tell my doctor BEFORE I take this drug?   If you are allergic to this drug; any part of this drug; or any other drugs, foods, or substances. Tell your doctor about the allergy and what signs you had.   If you have any of these health problems: Kidney disease or liver disease.   If you take any drugs (prescription or OTC, natural products, vitamins) that must not be taken with this drug, like certain drugs that are used for HIV, infections, or seizures. There are many drugs that must not be taken with this drug.   This is not a list of all drugs or health problems that interact with this drug.   Tell your doctor and pharmacist about all of your drugs (prescription or OTC, natural products, vitamins) and health problems. You must check to make sure that it is safe for you to take this drug with all of your drugs and health problems. Do not start, stop, or change the dose of any drug without checking with your doctor.  What are some things I need to know or do while I take this drug?   Tell all of your health care providers that you take this drug. This includes your doctors, nurses, pharmacists, and dentists.   This drug is not meant to prevent or lower the number of migraine headaches you get.   Tell your doctor if you are pregnant, plan on getting pregnant, or are breast-feeding. You will need to talk about the benefits and risks to you and the baby.  What are some side effects that I need to call my doctor about right away?   WARNING/CAUTION: Even though it may be rare, some people may have very bad and sometimes deadly side effects when taking a drug. Tell your doctor or get medical help right  away if you have any of the following signs or symptoms that may be related to a very bad side effect:   Signs of an allergic reaction, like rash; hives; itching; red, swollen, blistered, or peeling skin with or without fever; wheezing; tightness in the chest or throat; trouble breathing, swallowing, or talking; unusual hoarseness; or swelling of the mouth, face, lips, tongue, or throat.  What are some other side effects of this drug?   All drugs may cause side effects. However, many people have no side effects or only have minor side effects. Call your doctor or get medical help if any of these side effects or any other side effects bother you or do not go away:   Upset stomach.   These are not all of the side effects that may occur. If you have questions about side effects, call your doctor. Call your doctor for medical advice about side effects.   You may report side effects to your national health agency.  How is this drug best taken?   Use this drug as ordered by your doctor. Read all information given to you. Follow all instructions closely.   Do not push the tablet out of the foil when opening. Use dry hands to take it from the foil. Place on your tongue and let it dissolve. Water is not needed. Do not swallow it whole. Do  not chew, break, or crush it.   If needed, you may place the tablet under the tongue.   Use right after opening.  What do I do if I miss a dose?   This drug is taken on an as needed basis. Do not take more often than told by the doctor.  How do I store and/or throw out this drug?   Store at room temperature in a dry place. Do not store in a bathroom.   Store in foil pouch until ready for use.   Keep all drugs in a safe place. Keep all drugs out of the reach of children and pets.   Throw away unused or expired drugs. Do not flush down a toilet or pour down a drain unless you are told to do so. Check with your pharmacist if you have questions about the best way to throw  out drugs. There may be drug take-back programs in your area.  General drug facts   If your symptoms or health problems do not get better or if they become worse, call your doctor.   Do not share your drugs with others and do not take anyone else's drugs.   Some drugs may have another patient information leaflet. If you have any questions about this drug, please talk with your doctor, nurse, pharmacist, or other health care provider.   If you think there has been an overdose, call your poison control center or get medical care right away. Be ready to tell or show what was taken, how much, and when it happened.     Cyclobenzaprine tablets What is this medicine? CYCLOBENZAPRINE (sye kloe BEN za preen) is a muscle relaxer. It is used to treat muscle pain, spasms, and stiffness. This medicine may be used for other purposes; ask your health care provider or pharmacist if you have questions. COMMON BRAND NAME(S): Fexmid, Flexeril What should I tell my health care provider before I take this medicine? They need to know if you have any of these conditions:  heart disease, irregular heartbeat, or previous heart attack  liver disease  thyroid problem  an unusual or allergic reaction to cyclobenzaprine, tricyclic antidepressants, lactose, other medicines, foods, dyes, or preservatives  pregnant or trying to get pregnant  breast-feeding How should I use this medicine? Take this medicine by mouth with a glass of water. Follow the directions on the prescription label. If this medicine upsets your stomach, take it with food or milk. Take your medicine at regular intervals. Do not take it more often than directed. Talk to your pediatrician regarding the use of this medicine in children. Special care may be needed. Overdosage: If you think you have taken too much of this medicine contact a poison control center or emergency room at once. NOTE: This medicine is only for you. Do not share this  medicine with others. What if I miss a dose? If you miss a dose, take it as soon as you can. If it is almost time for your next dose, take only that dose. Do not take double or extra doses. What may interact with this medicine? Do not take this medicine with any of the following medications:  MAOIs like Carbex, Eldepryl, Marplan, Nardil, and Parnate  narcotic medicines for cough  safinamide This medicine may also interact with the following medications:  alcohol  bupropion  antihistamines for allergy, cough and cold  certain medicines for anxiety or sleep  certain medicines for bladder problems like oxybutynin, tolterodine  certain  medicines for depression like amitriptyline, fluoxetine, sertraline  certain medicines for Parkinson's disease like benztropine, trihexyphenidyl  certain medicines for seizures like phenobarbital, primidone  certain medicines for stomach problems like dicyclomine, hyoscyamine  certain medicines for travel sickness like scopolamine  general anesthetics like halothane, isoflurane, methoxyflurane, propofol  ipratropium  local anesthetics like lidocaine, pramoxine, tetracaine  medicines that relax muscles for surgery  narcotic medicines for pain  phenothiazines like chlorpromazine, mesoridazine, prochlorperazine, thioridazine  verapamil This list may not describe all possible interactions. Give your health care provider a list of all the medicines, herbs, non-prescription drugs, or dietary supplements you use. Also tell them if you smoke, drink alcohol, or use illegal drugs. Some items may interact with your medicine. What should I watch for while using this medicine? Tell your doctor or health care professional if your symptoms do not start to get better or if they get worse. You may get drowsy or dizzy. Do not drive, use machinery, or do anything that needs mental alertness until you know how this medicine affects you. Do not stand or sit up  quickly, especially if you are an older patient. This reduces the risk of dizzy or fainting spells. Alcohol may interfere with the effect of this medicine. Avoid alcoholic drinks. If you are taking another medicine that also causes drowsiness, you may have more side effects. Give your health care provider a list of all medicines you use. Your doctor will tell you how much medicine to take. Do not take more medicine than directed. Call emergency for help if you have problems breathing or unusual sleepiness. Your mouth may get dry. Chewing sugarless gum or sucking hard candy, and drinking plenty of water may help. Contact your doctor if the problem does not go away or is severe. What side effects may I notice from receiving this medicine? Side effects that you should report to your doctor or health care professional as soon as possible:  allergic reactions like skin rash, itching or hives, swelling of the face, lips, or tongue  breathing problems  chest pain  fast, irregular heartbeat  hallucinations  seizures  unusually weak or tired Side effects that usually do not require medical attention (report to your doctor or health care professional if they continue or are bothersome):  headache  nausea, vomiting This list may not describe all possible side effects. Call your doctor for medical advice about side effects. You may report side effects to FDA at 1-800-FDA-1088. Where should I keep my medicine? Keep out of the reach of children. Store at room temperature between 15 and 30 degrees C (59 and 86 degrees F). Keep container tightly closed. Throw away any unused medicine after the expiration date. NOTE: This sheet is a summary. It may not cover all possible information. If you have questions about this medicine, talk to your doctor, pharmacist, or health care provider.  2020 Elsevier/Gold Standard (2018-05-14 12:49:26)

## 2019-02-18 NOTE — Telephone Encounter (Signed)
I called and scheduled the patient. DW  °

## 2019-04-08 NOTE — Telephone Encounter (Signed)
Spoke with pt. She will come tomorrow 10/15 @ 3:30 pm arrival 15 minutes prior for a no-charge visit. Pt verbalized appreciation.

## 2019-04-09 ENCOUNTER — Ambulatory Visit (INDEPENDENT_AMBULATORY_CARE_PROVIDER_SITE_OTHER): Payer: BLUE CROSS/BLUE SHIELD | Admitting: Neurology

## 2019-04-09 ENCOUNTER — Other Ambulatory Visit: Payer: Self-pay

## 2019-04-09 VITALS — Temp 98.7°F

## 2019-04-09 DIAGNOSIS — G43711 Chronic migraine without aura, intractable, with status migrainosus: Secondary | ICD-10-CM

## 2019-04-09 DIAGNOSIS — M542 Cervicalgia: Secondary | ICD-10-CM

## 2019-04-12 NOTE — Progress Notes (Signed)
-  Trapezius muscle injection was performed at 3 sites, bilaterally. The first injection site was in the upper trapezius muscle halfway between the inflection point of the neck, and the acromion. The second injection site was one half way between the acromion and the first injection site. The third injection was done between the first injection site and the inflection point of the neck.

## 2019-04-13 NOTE — Progress Notes (Signed)
Botox- 100 units x 1 vial Lot: [id number] Expiration: 11/2020 NDC: 2446-9507-22 sample

## 2019-05-25 ENCOUNTER — Telehealth: Payer: Self-pay

## 2019-05-25 NOTE — Telephone Encounter (Signed)
I called the patient and made her aware we would have to get the new policy authorized before we could proceed. She was very understanding and will be awaiting our call. DW

## 2019-05-25 NOTE — Telephone Encounter (Signed)
Pt has called back and provided the following for [facility]#[id number] [phone] RX JOI#[id number]

## 2019-05-25 NOTE — Telephone Encounter (Signed)
I called the patient to inquire about her insurance coverage. We are showing her current policy listed with [place] is inactive. She did not answer so I left a VM asking her to call back. If she calls back please ask her for her current insurance information. DW

## 2019-05-26 ENCOUNTER — Ambulatory Visit: Payer: BC Managed Care – PPO | Admitting: Neurology

## 2019-06-01 NOTE — Telephone Encounter (Signed)
We can try thurs 12/17 @ 12:00 pm.

## 2019-06-01 NOTE — Telephone Encounter (Signed)
I called the pt and while speaking with her we had a cancellation for this Thursday at 3:30 pm. Pt has been scheduled. She very appreciative.

## 2019-06-01 NOTE — Telephone Encounter (Signed)
This patient was approved [employer] [id number] (08/09/19).   [name], is there anywhere on Dr. Cathren Laine schedule we can offer her before then end of the year?

## 2019-06-04 ENCOUNTER — Other Ambulatory Visit: Payer: Self-pay

## 2019-06-04 ENCOUNTER — Ambulatory Visit (INDEPENDENT_AMBULATORY_CARE_PROVIDER_SITE_OTHER): Payer: BLUE CROSS/BLUE SHIELD | Admitting: Neurology

## 2019-06-04 VITALS — Temp 98.3°F

## 2019-06-04 DIAGNOSIS — G43711 Chronic migraine without aura, intractable, with status migrainosus: Secondary | ICD-10-CM

## 2019-06-04 NOTE — Progress Notes (Signed)
Botox- 200 units x 1 vial Lot: [id number] Expiration: 02/2022 NDC: 0023-3921-02  Bacteriostatic 0.9% Sodium Chloride- 4mL total Lot: [id number] Expiration: 09/24/2019 NDC: 0409-1966-02  Dx: G43.711 B/B   

## 2019-06-04 NOTE — Progress Notes (Signed)
Consent Form Botulism Toxin Injection For Chronic Migraine   Interval history 06/04/2019: Continues excellent response, flexeril really helping. Out of 30 days, she has had 1 migraines which is a drastic improvement. >95% decrease in frequency. She has neck pain ordered dry needling which helped. +a. Doing great on the Gralise.  Roselyn Meier did not work. Has only needed to try Nurtec once.      Consent Form Botulism Toxin Injection For Chronic Migraine    Reviewed orally with patient, additionally signature is on file:  Botulism toxin has been approved by the Federal drug administration for treatment of chronic migraine. Botulism toxin does not cure chronic migraine and it may not be effective in some patients.  The administration of botulism toxin is accomplished by injecting a small amount of toxin into the muscles of the neck and head. Dosage must be titrated for each individual. Any benefits resulting from botulism toxin tend to wear off after 3 months with a repeat injection required if benefit is to be maintained. Injections are usually done every 3-4 months with maximum effect peak achieved by about 2 or 3 weeks. Botulism toxin is expensive and you should be sure of what costs you will incur resulting from the injection.  The side effects of botulism toxin use for chronic migraine may include:   -Transient, and usually mild, facial weakness with facial injections  -Transient, and usually mild, head or neck weakness with head/neck injections  -Reduction or loss of forehead facial animation due to forehead muscle weakness  -Eyelid drooping  -Dry eye  -Pain at the site of injection or bruising at the site of injection  -Double vision  -Potential unknown long term risks  Contraindications: You should not have Botox if you are pregnant, nursing, allergic to albumin, have an infection, skin condition, or muscle weakness at the site of the injection, or have myasthenia gravis,  Lambert-Eaton syndrome, or ALS.  It is also possible that as with any injection, there may be an allergic reaction or no effect from the medication. Reduced effectiveness after repeated injections is sometimes seen and rarely infection at the injection site may occur. All care will be taken to prevent these side effects. If therapy is given over a long time, atrophy and wasting in the muscle injected may occur. Occasionally the patient's become refractory to treatment because they develop antibodies to the toxin. In this event, therapy needs to be modified.  I have read the above information and consent to the administration of botulism toxin.    BOTOX PROCEDURE NOTE FOR MIGRAINE HEADACHE    Contraindications and precautions discussed with patient(above). Aseptic procedure was observed and patient tolerated procedure. Procedure performed by Dr. Georgia Dom  The condition has existed for more than 6 months, and pt does not have a diagnosis of ALS, Myasthenia Gravis or Lambert-Eaton Syndrome.  Risks and benefits of injections discussed and pt agrees to proceed with the procedure.  Written consent obtained  These injections are medically necessary. Pt  receives good benefits from these injections. These injections do not cause sedations or hallucinations which the oral therapies may cause.  Description of procedure:  The patient was placed in a sitting position. The standard protocol was used for Botox as follows, with 5 units of Botox injected at each site:   -Procerus muscle, midline injection  -Corrugator muscle, bilateral injection  -Frontalis muscle, bilateral injection, with 2 sites each side, medial injection was performed in the upper one third of the frontalis  muscle, in the region vertical from the medial inferior edge of the superior orbital rim. The lateral injection was again in the upper one third of the forehead vertically above the lateral limbus of the cornea, 1.5 cm lateral  to the medial injection site.  -Temporalis muscle injection, 4 sites, bilaterally. The first injection was 3 cm above the tragus of the ear, second injection site was 1.5 cm to 3 cm up from the first injection site in line with the tragus of the ear. The third injection site was 1.5-3 cm forward between the first 2 injection sites. The fourth injection site was 1.5 cm posterior to the second injection site.   -Occipitalis muscle injection, 3 sites, bilaterally. The first injection was done one half way between the occipital protuberance and the tip of the mastoid process behind the ear. The second injection site was done lateral and superior to the first, 1 fingerbreadth from the first injection. The third injection site was 1 fingerbreadth superiorly and medially from the first injection site.  -Cervical paraspinal muscle injection, 2 sites, bilateral knee first injection site was 1 cm from the midline of the cervical spine, 3 cm inferior to the lower border of the occipital protuberance. The second injection site was 1.5 cm superiorly and laterally to the first injection site.  -Trapezius muscle injection was performed at 3 sites, bilaterally. The first injection site was in the upper trapezius muscle halfway between the inflection point of the neck, and the acromion. The second injection site was one half way between the acromion and the first injection site. The third injection was done between the first injection site and the inflection point of the neck.   Will return for repeat injection in 3 months.   200 units of Botox was used, any Botox not injected was wasted. The patient tolerated the procedure well, there were no complications of the above procedure.

## 2019-06-25 DIAGNOSIS — Z20828 Contact with and (suspected) exposure to other viral communicable diseases: Secondary | ICD-10-CM | POA: Diagnosis not present

## 2019-06-29 DIAGNOSIS — R1033 Periumbilical pain: Secondary | ICD-10-CM | POA: Diagnosis not present

## 2019-06-29 DIAGNOSIS — Z86018 Personal history of other benign neoplasm: Secondary | ICD-10-CM | POA: Diagnosis not present

## 2019-06-29 DIAGNOSIS — K900 Celiac disease: Secondary | ICD-10-CM | POA: Diagnosis not present

## 2019-06-29 DIAGNOSIS — R109 Unspecified abdominal pain: Secondary | ICD-10-CM | POA: Diagnosis not present

## 2019-06-29 DIAGNOSIS — R110 Nausea: Secondary | ICD-10-CM | POA: Diagnosis not present

## 2019-06-29 DIAGNOSIS — R194 Change in bowel habit: Secondary | ICD-10-CM | POA: Diagnosis not present

## 2019-06-29 DIAGNOSIS — K5730 Diverticulosis of large intestine without perforation or abscess without bleeding: Secondary | ICD-10-CM | POA: Diagnosis not present

## 2019-06-29 DIAGNOSIS — Z1211 Encounter for screening for malignant neoplasm of colon: Secondary | ICD-10-CM | POA: Diagnosis not present

## 2019-07-01 DIAGNOSIS — K828 Other specified diseases of gallbladder: Secondary | ICD-10-CM | POA: Diagnosis not present

## 2019-07-01 DIAGNOSIS — Z975 Presence of (intrauterine) contraceptive device: Secondary | ICD-10-CM | POA: Diagnosis not present

## 2019-07-01 DIAGNOSIS — K6389 Other specified diseases of intestine: Secondary | ICD-10-CM | POA: Diagnosis not present

## 2019-07-01 DIAGNOSIS — R110 Nausea: Secondary | ICD-10-CM | POA: Diagnosis not present

## 2019-07-01 DIAGNOSIS — R197 Diarrhea, unspecified: Secondary | ICD-10-CM | POA: Diagnosis not present

## 2019-07-01 DIAGNOSIS — R5383 Other fatigue: Secondary | ICD-10-CM | POA: Diagnosis not present

## 2019-07-01 DIAGNOSIS — Z888 Allergy status to other drugs, medicaments and biological substances status: Secondary | ICD-10-CM | POA: Diagnosis not present

## 2019-07-01 DIAGNOSIS — R109 Unspecified abdominal pain: Secondary | ICD-10-CM | POA: Diagnosis not present

## 2019-07-01 DIAGNOSIS — R42 Dizziness and giddiness: Secondary | ICD-10-CM | POA: Diagnosis not present

## 2019-07-01 DIAGNOSIS — E860 Dehydration: Secondary | ICD-10-CM | POA: Diagnosis not present

## 2019-07-01 DIAGNOSIS — Z79899 Other long term (current) drug therapy: Secondary | ICD-10-CM | POA: Diagnosis not present

## 2019-07-01 DIAGNOSIS — K529 Noninfective gastroenteritis and colitis, unspecified: Secondary | ICD-10-CM | POA: Diagnosis not present

## 2019-07-06 DIAGNOSIS — Z20828 Contact with and (suspected) exposure to other viral communicable diseases: Secondary | ICD-10-CM | POA: Diagnosis not present

## 2019-07-07 DIAGNOSIS — R197 Diarrhea, unspecified: Secondary | ICD-10-CM | POA: Diagnosis not present

## 2019-07-07 DIAGNOSIS — K900 Celiac disease: Secondary | ICD-10-CM | POA: Diagnosis not present

## 2019-07-07 DIAGNOSIS — K529 Noninfective gastroenteritis and colitis, unspecified: Secondary | ICD-10-CM | POA: Diagnosis not present

## 2019-07-21 DIAGNOSIS — G43711 Chronic migraine without aura, intractable, with status migrainosus: Secondary | ICD-10-CM

## 2019-07-21 DIAGNOSIS — K900 Celiac disease: Secondary | ICD-10-CM | POA: Diagnosis not present

## 2019-07-21 DIAGNOSIS — R194 Change in bowel habit: Secondary | ICD-10-CM | POA: Diagnosis not present

## 2019-07-21 MED ORDER — CAMBIA 50 MG PO PACK: PACK | ORAL | 0 refills | Status: DC

## 2019-07-21 NOTE — Telephone Encounter (Signed)
Ok to provide 2 samples of Cambia 50 mg per Dr. Lucia Gaskins. I have placed the order.

## 2019-07-22 DIAGNOSIS — U071 COVID-19: Secondary | ICD-10-CM | POA: Diagnosis not present

## 2019-07-22 DIAGNOSIS — K900 Celiac disease: Secondary | ICD-10-CM | POA: Diagnosis not present

## 2019-07-22 DIAGNOSIS — R05 Cough: Secondary | ICD-10-CM | POA: Diagnosis not present

## 2019-07-22 DIAGNOSIS — Z0000 Encounter for general adult medical examination without abnormal findings: Secondary | ICD-10-CM | POA: Diagnosis not present

## 2019-07-25 ENCOUNTER — Ambulatory Visit (INDEPENDENT_AMBULATORY_CARE_PROVIDER_SITE_OTHER): Payer: BLUE CROSS/BLUE SHIELD

## 2019-07-25 ENCOUNTER — Other Ambulatory Visit: Payer: Self-pay

## 2019-07-25 ENCOUNTER — Ambulatory Visit
Admission: EM | Admit: 2019-07-25 | Discharge: 2019-07-25 | Disposition: A | Discharge status: Home / Self Care | Payer: BLUE CROSS/BLUE SHIELD | Attending: Emergency Medicine | Admitting: Emergency Medicine

## 2019-07-25 DIAGNOSIS — R05 Cough: Secondary | ICD-10-CM

## 2019-07-25 DIAGNOSIS — R0789 Other chest pain: Secondary | ICD-10-CM | POA: Diagnosis not present

## 2019-07-25 DIAGNOSIS — U071 COVID-19: Secondary | ICD-10-CM

## 2019-07-25 MED ORDER — BENZONATATE 100 MG PO CAPS: 100.0000 mg | ORAL_CAPSULE | Freq: Three times a day (TID) | ORAL | 0 refills | Status: DC

## 2019-07-25 MED ORDER — PREDNISONE 10 MG PO TABS: 20.0000 mg | ORAL_TABLET | Freq: Every day | ORAL | 0 refills | Status: DC

## 2019-07-25 MED ORDER — ALBUTEROL SULFATE HFA 108 (90 BASE) MCG/ACT IN AERS: 1.0000 | INHALATION_SPRAY | Freq: Four times a day (QID) | RESPIRATORY_TRACT | 0 refills | Status: DC | PRN

## 2019-07-25 MED ORDER — FLUTICASONE PROPIONATE 50 MCG/ACT NA SUSP: 1.0000 | Freq: Every day | NASAL | 0 refills | Status: DC

## 2019-07-25 NOTE — ED Triage Notes (Addendum)
Pt presents to UC w/ c/o chest tightness and sinus congestion x 1 week Pt tested positive for covid 5 days ago.

## 2019-07-25 NOTE — ED Provider Notes (Signed)
[facility]    CSN: [id number] Arrival date & time: 07/25/19  1128      History   Chief Complaint Chief Complaint  Patient presents with  . congestion, chest tightness, positive covid    HPI [name] is a 44 y.o. female.   Presented to the urgent care with a complaint of chest tightness, worsening cough and congestion for the past 1 week.  She tested positive for COVID-19 5 days ago.  Denies recent travel.  Denies aggravating or alleviating symptoms.  Denies previous COVID infection.   Denies fever, chills, fatigue, rhinorrhea, sore throat,  SOB, wheezing, chest pain, nausea, vomiting, changes in bowel or bladder habits.    The history is provided by the patient. No language interpreter was used.    Past Medical History:  Diagnosis Date  . Anxiety   . Celiac sprue   . Headache    migraines  . TMJ arthralgia     Patient Active Problem List   Diagnosis Date Noted  . Cervicalgia 02/14/2018  . 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 Abdominal pain, epigastric 10/05/2014  . Migraine 10/20/2013  . 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 lymph node removal  2000    OB History   No obstetric history on file.      Home Medications    Prior to Admission medications   Medication Sig Start Date End Date Taking? Authorizing Provider  albuterol (VENTOLIN HFA) 108 (90 Base) MCG/ACT inhaler Inhale 1-2 puffs into the lungs every 6 (six) hours as needed for wheezing or shortness of breath. 07/25/19   Elice Crigger, Zachery Dakins, FNP  ALPRAZolam (XANAX) 0.25 MG tablet 0.25 mg as needed. 05/01/16   [provider]  benzonatate (TESSALON) 100 MG capsule Take 1 capsule (100 mg total) by mouth every 8 (eight) hours. 07/25/19   Ruby Dilone, Zachery Dakins, FNP  cyclobenzaprine (FLEXERIL) 10 MG tablet Take 1 tablet (10 mg total) by mouth 3 (three) times daily as needed for muscle spasms.  02/17/19   Anson Fret, MD  Diclofenac Potassium,Migraine, (CAMBIA) 50 MG PACK MIX ONE PACKET WITH 1-2 OUNCES OF WATER AS NEEDED FOR MIGRAINE. DRINK IMMEDIATELY AS A SINGLE DOSE. 07/21/19   Anson Fret, MD  fluticasone (FLONASE) 50 MCG/ACT nasal spray Place 1 spray into both nostrils daily for 14 days. 07/25/19 08/08/19  Ramey Schiff, Zachery Dakins, FNP  hyoscyamine (LEVSIN SL) 0.125 MG SL tablet DISSOLVE 1 TABLET UNDER THE TONGUE EVERY 4 HOURS AS NEEDED FOR ABDOMINAL CRAMPING 10/07/14   Rachael Fee, MD  levonorgestrel [facility]) 20 MCG/24HR IUD 1 each by Intrauterine route once.    [provider]  linaclotide (LINZESS) 290 MCG CAPS capsule Take 290 mcg by mouth daily.    [provider]  methylPREDNISolone (MEDROL DOSEPAK) 4 MG TBPK tablet Take the pills each morning together with food for 6 days. 02/17/19   Anson Fret, MD  Multiple Vitamin (MULTIVITAMIN) tablet Take 1 tablet by mouth daily.    [provider]  OVER THE COUNTER MEDICATION Ultra flora 1B Probiotic    [provider]  predniSONE (DELTASONE) 10 MG tablet Take 2 tablets (20 mg total) by mouth daily. 07/25/19   Sharnell Knight, Zachery Dakins, FNP  rizatriptan (MAXALT-MLT) 10 MG disintegrating tablet DISSOLVE 1 TABLET IN MOUTH AS NEEDED FOR MIGRAINE, MAY REPEAT IN 2 HOURS AS NEEDED. MAX 2 DOSES IN 24 HOURS. 01/27/19  Melvenia Beam, MD    Family History Family History  Problem Relation Age of Onset  . Hypertension Mother   . Diabetes Mother   . Celiac disease Mother   . Coronary artery disease Maternal Grandfather   . Pulmonary fibrosis Father     Social History Social History   Tobacco Use  . Smoking status: Former Smoker    Quit date: 06/28/1999    Years since quitting: 20.0  . Smokeless tobacco: Never Used  Substance Use Topics  . Alcohol use: No    Comment: 1-2/year  . Drug use: No     Allergies   Topamax [topiramate]   Review of Systems Review of Systems  Constitutional:  Negative.   HENT: Positive for congestion.   Respiratory: Positive for cough and chest tightness.   Cardiovascular: Negative.   Gastrointestinal: Negative.   Neurological: Negative.      Physical Exam Triage Vital Signs ED Triage Vitals  Enc Vitals Group     BP 07/25/19 1137 107/73     Pulse Rate 07/25/19 1137 100     Resp 07/25/19 1137 16     Temp 07/25/19 1137 98.3 F (36.8 C)     Temp Source 07/25/19 1137 Oral     SpO2 07/25/19 1137 99 %     Weight --      Height --      Head Circumference --      Peak Flow --      Pain Score 07/25/19 1146 0     Pain Loc --      Pain Edu? --      Excl. in [place]? --    No data found.  Updated Vital Signs BP 107/73 (BP Location: Right Arm)   Pulse 100   Temp 98.3 F (36.8 C) (Oral)   Resp 16   SpO2 99%   Visual Acuity Right Eye Distance:   Left Eye Distance:   Bilateral Distance:    Right Eye Near:   Left Eye Near:    Bilateral Near:     Physical Exam Vitals and nursing note reviewed.  Constitutional:      General: She is not in acute distress.    Appearance: Normal appearance. She is normal weight. She is not ill-appearing or toxic-appearing.  HENT:     Head: Normocephalic.     Right Ear: Tympanic membrane, ear canal and external ear normal. There is no impacted cerumen.     Left Ear: Tympanic membrane, ear canal and external ear normal. There is no impacted cerumen.     Nose: Nose normal. No congestion.     Mouth/Throat:     Mouth: Mucous membranes are moist.     Pharynx: Oropharynx is clear. No oropharyngeal exudate or posterior oropharyngeal erythema.  Cardiovascular:     Rate and Rhythm: Normal rate and regular rhythm.     Pulses: Normal pulses.     Heart sounds: Normal heart sounds. No murmur.  Pulmonary:     Effort: Pulmonary effort is normal. No respiratory distress.     Breath sounds: Normal breath sounds. No wheezing or rhonchi.  Chest:     Chest wall: No tenderness.  Abdominal:     General: Abdomen is  flat. Bowel sounds are normal. There is no distension.     Palpations: There is no mass.     Tenderness: There is no abdominal tenderness.  Skin:    Capillary Refill: Capillary refill takes less than 2 seconds.  Neurological:  General: No focal deficit present.     Mental Status: She is alert and oriented to person, place, and time.      UC Treatments / Results  Labs (all labs ordered are listed, but only abnormal results are displayed) Labs Reviewed - No data to display  EKG   Radiology DG Chest 2 View  Result Date: 07/25/2019 CLINICAL DATA:  Chest tightness and cough. COVID-19 positive 3 days ago. EXAM: CHEST - 2 VIEW COMPARISON:  10/12/2014 FINDINGS: Lungs are adequately inflated with interval development of subtle patchy density over the lung bases. No effusion. Cardiomediastinal silhouette and remainder of the exam is unchanged. IMPRESSION: Subtle patchy density over the lung bases which may be due to infection. Electronically Signed   By: Elberta Fortis M.D.   On: 07/25/2019 12:20    Procedures Procedures (including critical care time)  Medications Ordered in UC Medications - No data to display  Initial Impression / Assessment and Plan / UC Course  I have reviewed the triage vital signs and the nursing notes.  Pertinent labs & imaging results that were available during my care of the patient were reviewed by me and considered in my medical decision making (see chart for details).   Chest x-ray was ordered and result was reviewed.  Results showed patchy density on the lung bases due to possible Covid 19 infection.  Flonase will be prescribed Proair will be prescribed Tessalon Perles will be prescribed Short-term prednisone will be prescribed Advised patient to quarantine Go to ED for worsening of symptoms   Final Clinical Impressions(s) / UC Diagnoses   Final diagnoses:  COVID-19 virus infection     Discharge Instructions     You should remain isolated  in your home for 10 days from symptom onset AND greater than 72 hours after symptoms resolution (absence of fever without the use of fever-reducing medication and improvement in respiratory symptoms), whichever is longer Get plenty of rest and push fluids Use medications daily for symptom relief Use OTC medications like ibuprofen or tylenol as needed fever or pain Call or go to the ED if you have any new or worsening symptoms such as fever, worsening cough, shortness of breath, chest tightness, chest pain, turning blue, changes in mental status, etc...     ED Prescriptions    Medication Sig Dispense Auth. Provider   fluticasone (FLONASE) 50 MCG/ACT nasal spray Place 1 spray into both nostrils daily for 14 days. 16 g Coleby Yett S, FNP   benzonatate (TESSALON) 100 MG capsule Take 1 capsule (100 mg total) by mouth every 8 (eight) hours. 21 capsule Ivone Licht S, FNP   albuterol (VENTOLIN HFA) 108 (90 Base) MCG/ACT inhaler Inhale 1-2 puffs into the lungs every 6 (six) hours as needed for wheezing or shortness of breath. 18 g Shalane Florendo, March Rummage S, FNP   predniSONE (DELTASONE) 10 MG tablet Take 2 tablets (20 mg total) by mouth daily. 15 tablet Natasia Sanko, Zachery Dakins, FNP     PDMP not reviewed this encounter.   Durward Parcel, FNP 07/25/19 1231

## 2019-07-25 NOTE — Discharge Instructions (Addendum)
You should remain isolated in your home for 10 days from symptom onset AND greater than 72 hours after symptoms resolution (absence of fever without the use of fever-reducing medication and improvement in respiratory symptoms), whichever is longer °Get plenty of rest and push fluids °Use medications daily for symptom relief °Use OTC medications like ibuprofen or tylenol as needed fever or pain °Call or go to the ED if you have any new or worsening symptoms such as fever, worsening cough, shortness of breath, chest tightness, chest pain, turning blue, changes in mental status, etc... °

## 2019-07-27 ENCOUNTER — Telehealth: Payer: Self-pay | Admitting: Neurology

## 2019-07-27 ENCOUNTER — Encounter (HOSPITAL_BASED_OUTPATIENT_CLINIC_OR_DEPARTMENT_OTHER): Payer: Self-pay | Admitting: *Deleted

## 2019-07-27 ENCOUNTER — Emergency Department (HOSPITAL_BASED_OUTPATIENT_CLINIC_OR_DEPARTMENT_OTHER): Payer: BLUE CROSS/BLUE SHIELD

## 2019-07-27 ENCOUNTER — Emergency Department (HOSPITAL_BASED_OUTPATIENT_CLINIC_OR_DEPARTMENT_OTHER)
Admission: EM | Admit: 2019-07-27 | Discharge: 2019-07-27 | Disposition: A | Discharge status: Home / Self Care | Payer: BLUE CROSS/BLUE SHIELD | Attending: Emergency Medicine | Admitting: Emergency Medicine

## 2019-07-27 ENCOUNTER — Other Ambulatory Visit: Payer: Self-pay

## 2019-07-27 DIAGNOSIS — R05 Cough: Secondary | ICD-10-CM | POA: Diagnosis not present

## 2019-07-27 DIAGNOSIS — U071 COVID-19: Secondary | ICD-10-CM | POA: Insufficient documentation

## 2019-07-27 DIAGNOSIS — Z87891 Personal history of nicotine dependence: Secondary | ICD-10-CM | POA: Insufficient documentation

## 2019-07-27 DIAGNOSIS — Z79899 Other long term (current) drug therapy: Secondary | ICD-10-CM | POA: Diagnosis not present

## 2019-07-27 DIAGNOSIS — R0602 Shortness of breath: Secondary | ICD-10-CM | POA: Diagnosis not present

## 2019-07-27 DIAGNOSIS — J1282 Pneumonia due to coronavirus disease 2019: Secondary | ICD-10-CM | POA: Diagnosis not present

## 2019-07-27 LAB — CBC WITH DIFFERENTIAL/PLATELET
Abs Immature Granulocytes: 0.02 K/uL (ref 0.00–0.07)
Basophils Absolute: 0 K/uL (ref 0.0–0.1)
Basophils Relative: 0 %
Eosinophils Absolute: 0 K/uL (ref 0.0–0.5)
Eosinophils Relative: 0 %
HCT: 40.8 % (ref 36.0–46.0)
Hemoglobin: 13.1 g/dL (ref 12.0–15.0)
Immature Granulocytes: 1 %
Lymphocytes Relative: 11 %
Lymphs Abs: 0.4 K/uL — ABNORMAL LOW (ref 0.7–4.0)
MCH: 30.2 pg (ref 26.0–34.0)
MCHC: 32.1 g/dL (ref 30.0–36.0)
MCV: 94 fL (ref 80.0–100.0)
Monocytes Absolute: 0.1 K/uL (ref 0.1–1.0)
Monocytes Relative: 4 %
Neutro Abs: 2.6 K/uL (ref 1.7–7.7)
Neutrophils Relative %: 84 %
Platelets: 214 K/uL (ref 150–400)
RBC: 4.34 MIL/uL (ref 3.87–5.11)
RDW: 13.3 % (ref 11.5–15.5)
WBC: 3.2 K/uL — ABNORMAL LOW (ref 4.0–10.5)
nRBC: 0 % (ref 0.0–0.2)

## 2019-07-27 LAB — COMPREHENSIVE METABOLIC PANEL
ALT: 75 U/L — ABNORMAL HIGH (ref 0–44)
AST: 85 U/L — ABNORMAL HIGH (ref 15–41)
Albumin: 3.8 g/dL (ref 3.5–5.0)
Alkaline Phosphatase: 63 U/L (ref 38–126)
Anion gap: 6 (ref 5–15)
BUN: 10 mg/dL (ref 6–20)
CO2: 29 mmol/L (ref 22–32)
Calcium: 9.2 mg/dL (ref 8.9–10.3)
Chloride: 102 mmol/L (ref 98–111)
Creatinine, Ser: 0.47 mg/dL (ref 0.44–1.00)
GFR calc Af Amer: >60 mL/min (ref >60)
GFR calc non Af Amer: >60 mL/min (ref >60)
Glucose, Bld: 96 mg/dL (ref 70–99)
Potassium: 3.5 mmol/L (ref 3.5–5.1)
Sodium: 137 mmol/L (ref 135–145)
Total Bilirubin: 0.4 mg/dL (ref 0.3–1.2)
Total Protein: 7.2 g/dL (ref 6.5–8.1)

## 2019-07-27 LAB — TROPONIN I (HIGH SENSITIVITY): Troponin I (High Sensitivity): <2 ng/L (ref <18)

## 2019-07-27 MED ORDER — SODIUM CHLORIDE 0.9 % IV BOLUS
1000.0000 mL | Freq: Once | INTRAVENOUS | Status: AC
  Administered 2019-07-27: 1000 mL via INTRAVENOUS

## 2019-07-27 NOTE — ED Provider Notes (Signed)
[place] EMER[facility]ENCY DEPARTMENT Provider Note   CSN: [id number] Arrival date & time: 07/27/19  1047     History Chief Complaint  Patient presents with  . Shortness of Breath    [name] is a 44 y.o. female.  HPI 44 year old female presents with chest pressure/heaviness.  Ongoing for a couple days.  She was diagnosed with the novel coronavirus about 5 days ago.  Typical symptoms at first such as fever, cough, sore throat, loss of taste and smell.  Cough is a little better.  Now having more chest pressure/tightness.  Feels like she cannot get a full breath.  No pleuritic chest pain, leg swelling, estrogen use or smoking.  Was given albuterol at an urgent care yesterday but does not feel like it is helping.  Past Medical History:  Diagnosis Date  . Anxiety   . Celiac sprue   . Headache    migraines  . TMJ arthralgia     Patient Active Problem List   Diagnosis Date Noted  . Cervicalgia 02/14/2018  . 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 Abdominal pain, epigastric 10/05/2014  . Migraine 10/20/2013  . 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 lymph node removal  2000     OB History   No obstetric history on file.     Family History  Problem Relation Age of Onset  . Hypertension Mother   . Diabetes Mother   . Celiac disease Mother   . Coronary artery disease Maternal Grandfather   . Pulmonary fibrosis Father     Social History   Tobacco Use  . Smoking status: Former Smoker    Quit date: 06/28/1999    Years since quitting: 20.0  . Smokeless tobacco: Never Used  Substance Use Topics  . Alcohol use: No    Comment: 1-2/year  . Drug use: No    Home Medications Prior to Admission medications   Medication Sig Start Date End Date Taking? Authorizing Provider  albuterol (VENTOLIN HFA) 108 (90 Base) MCG/ACT inhaler Inhale 1-2 puffs into the lungs every 6 (six)  hours as needed for wheezing or shortness of breath. 07/25/19   Avegno, Darrelyn Hillock, FNP  ALPRAZolam (XANAX) 0.25 MG tablet 0.25 mg as needed. 05/01/16   [provider]  benzonatate (TESSALON) 100 MG capsule Take 1 capsule (100 mg total) by mouth every 8 (eight) hours. 07/25/19   Avegno, Darrelyn Hillock, FNP  cyclobenzaprine (FLEXERIL) 10 MG tablet Take 1 tablet (10 mg total) by mouth 3 (three) times daily as needed for muscle spasms. 02/17/19   Melvenia Beam, MD  Diclofenac Potassium,Migraine, (CAMBIA) 50 MG PACK MIX ONE PACKET WITH 1-2 OUNCES OF WATER AS NEEDED FOR MIGRAINE. DRINK IMMEDIATELY AS A SINGLE DOSE. 07/21/19   Melvenia Beam, MD  fluticasone (FLONASE) 50 MCG/ACT nasal spray Place 1 spray into both nostrils daily for 14 days. 07/25/19 08/08/19  Avegno, Darrelyn Hillock, FNP  hyoscyamine (LEVSIN SL) 0.125 MG SL tablet DISSOLVE 1 TABLET UNDER THE TONGUE EVERY 4 HOURS AS NEEDED FOR ABDOMINAL CRAMPING 10/07/14   Milus Banister, MD  levonorgestrel [facility]) 20 MCG/24HR IUD 1 each by Intrauterine route once.    [provider]  linaclotide (LINZESS) 290 MCG CAPS capsule Take 290 mcg by mouth daily.    [provider]  methylPREDNISolone (MEDROL DOSEPAK) 4 MG TBPK tablet Take the pills each morning together with food for 6 days. 02/17/19  Anson Fret, MD  Multiple Vitamin (MULTIVITAMIN) tablet Take 1 tablet by mouth daily.    [provider]  OVER THE COUNTER MEDICATION Ultra flora 1B Probiotic    [provider]  predniSONE (DELTASONE) 10 MG tablet Take 2 tablets (20 mg total) by mouth daily. 07/25/19   Avegno, Zachery Dakins, FNP  rizatriptan (MAXALT-MLT) 10 MG disintegrating tablet DISSOLVE 1 TABLET IN MOUTH AS NEEDED FOR MIGRAINE, MAY REPEAT IN 2 HOURS AS NEEDED. MAX 2 DOSES IN 24 HOURS. 01/27/19   Anson Fret, MD    Allergies    Topamax [topiramate]  Review of Systems   Review of Systems  Respiratory: Positive for cough and shortness of breath.     Cardiovascular: Positive for chest pain. Negative for leg swelling.  Gastrointestinal: Negative for abdominal pain and vomiting.  All other systems reviewed and are negative.   Physical Exam Updated Vital Signs BP 110/71   Pulse 91   Temp 98.6 F (37 C) (Oral)   Resp (!) 30   Ht 5\' 4"  (1.626 m)   Wt 52.2 kg   SpO2 99%   BMI 19.74 kg/m   Physical Exam Vitals and nursing note reviewed.  Constitutional:      Appearance: She is well-developed.  HENT:     Head: Normocephalic and atraumatic.     Right Ear: External ear normal.     Left Ear: External ear normal.     Nose: Nose normal.  Eyes:     General:        Right eye: No discharge.        Left eye: No discharge.  Cardiovascular:     Rate and Rhythm: Regular rhythm. Tachycardia present.     Heart sounds: Normal heart sounds.     Comments: HR low 100s Pulmonary:     Effort: Pulmonary effort is normal. No tachypnea or accessory muscle usage.     Breath sounds: Normal breath sounds. No decreased breath sounds, wheezing, rhonchi or rales.  Abdominal:     Palpations: Abdomen is soft.     Tenderness: There is no abdominal tenderness.  Musculoskeletal:     Right lower leg: No edema.     Left lower leg: No edema.  Skin:    General: Skin is warm and dry.  Neurological:     Mental Status: She is alert.  Psychiatric:        Mood and Affect: Mood is not anxious.     ED Results / Procedures / Treatments   Labs (all labs ordered are listed, but only abnormal results are displayed) Labs Reviewed  COMPREHENSIVE METABOLIC PANEL - Abnormal; Notable for the following components:      Result Value   AST 85 (*)    ALT 75 (*)    All other components within normal limits  CBC WITH DIFFERENTIAL/PLATELET - Abnormal; Notable for the following components:   WBC 3.2 (*)    Lymphs Abs 0.4 (*)    All other components within normal limits  TROPONIN I (HIGH SENSITIVITY)    EKG EKG Interpretation  Date/Time:  Monday July 27 2019 11:39:41 EST Ventricular Rate:  91 PR Interval:    QRS Duration: 92 QT Interval:  344 QTC Calculation: 424 R Axis:   53 Text Interpretation: Sinus rhythm RSR' in V1 or V2, probably normal variant Baseline wander in lead(s) V5 V6 no acute ST/T changes nonspecific ST/T changes no longer present compared to 2016 Confirmed by [phone]) on  07/27/2019 12:07:16 PM   Radiology DG Chest Portable 1 View  Result Date: 07/27/2019 CLINICAL DATA:  COVID positive, cough EXAM: PORTABLE CHEST 1 VIEW COMPARISON:  2016 FINDINGS: Increased patchy density at the right greater than left lung bases. No pleural effusion or pneumothorax. Cardiomediastinal silhouette is within normal limits. IMPRESSION: Increased patchy atelectasis/consolidation at the right greater than left lung bases. Electronically Signed   By: Guadlupe Spanish M.D.   On: 07/27/2019 11:57    Procedures Procedures (including critical care time)  Medications Ordered in ED Medications  sodium chloride 0.9 % bolus 1,000 mL (1,000 mLs Intravenous New Bag/Given 07/27/19 1149)    ED Course  I have reviewed the triage vital signs and the nursing notes.  Pertinent labs & imaging results that were available during my care of the patient were reviewed by me and considered in my medical decision making (see chart for details).    MDM Rules/Calculators/A&P                      Patient's work-up shows mild leukopenia which goes along with her Covid infection.  Mild LFT abnormalities.  Otherwise, she was able to ambulate and did not feel particularly short of breath.  Did not desaturate.  Heart rate has improved.  My suspicion is this is related to her Covid pneumonia.  Given she is not hypoxic, my suspicion of a PE is much less.  Given that I think this is unlikely I do not think work-up would be helpful, especially D-dimer is likely to be elevated due to her Covid status.  X-ray is slightly worse but still not that bad.  Discussed  supportive care at home and return precautions but she otherwise appears stable for discharge.  [name] was evaluated in Emergency Department on 07/27/2019 for the symptoms described in the history of present illness. She was evaluated in the context of the global COVID-19 pandemic, which necessitated consideration that the patient might be at risk for infection with the SARS-CoV-2 virus that causes COVID-19. Institutional protocols and algorithms that pertain to the evaluation of patients at risk for COVID-19 are in a state of rapid change based on information released by regulatory bodies including the CDC and federal and state organizations. These policies and algorithms were followed during the patient's care in the ED.  Final Clinical Impression(s) / ED Diagnoses Final diagnoses:  Pneumonia due to COVID-19 virus    Rx / DC Orders ED Discharge Orders    None       Pricilla Loveless, MD 07/27/19 1241

## 2019-07-27 NOTE — ED Triage Notes (Signed)
covid pos  C/o short of breath onset Saturday,  Coughing, chest tightness

## 2019-07-27 NOTE — Telephone Encounter (Signed)
lvm to r/s 3/18 appt due to MD being out  

## 2019-08-05 ENCOUNTER — Encounter: Payer: Self-pay | Admitting: Neurology

## 2019-08-05 NOTE — Telephone Encounter (Signed)
Noted, thank you. DWD  

## 2019-08-05 NOTE — Telephone Encounter (Signed)
FYI  We have attempted to contact patient x2 and LVM x2. Requested patient call back to reschedule this appointment. I have cancelled the appointment and sent a letter to patient to advise.

## 2019-08-26 NOTE — Telephone Encounter (Signed)
Pt is asking for a call to discuss rescheduling her appointment

## 2019-08-26 NOTE — Telephone Encounter (Signed)
How about Wednesday or Thursday this week at the end of the day? Happy to add her in.

## 2019-08-27 ENCOUNTER — Telehealth: Payer: Self-pay

## 2019-08-27 ENCOUNTER — Encounter: Payer: Self-pay | Admitting: *Deleted

## 2019-08-27 NOTE — Telephone Encounter (Signed)
[name] scheduled the pt.

## 2019-08-27 NOTE — Telephone Encounter (Signed)
Any day for her. As long as it is not one of the days I am leaving early, I know I have an appointment scheduled in a few weeks thanks

## 2019-09-03 ENCOUNTER — Telehealth: Payer: Self-pay | Admitting: *Deleted

## 2019-09-03 NOTE — Telephone Encounter (Signed)
I called [employer] and per Danella Maiers [phone] and 19802 are covered.  [id number] will need PA. Ref# for call [id number].   PA initiated.

## 2019-09-10 ENCOUNTER — Ambulatory Visit: Payer: Self-pay | Admitting: Neurology

## 2019-09-14 ENCOUNTER — Ambulatory Visit (INDEPENDENT_AMBULATORY_CARE_PROVIDER_SITE_OTHER): Payer: BC Managed Care – PPO | Admitting: Neurology

## 2019-09-14 ENCOUNTER — Other Ambulatory Visit: Payer: Self-pay | Admitting: Neurology

## 2019-09-14 ENCOUNTER — Other Ambulatory Visit: Payer: Self-pay

## 2019-09-14 VITALS — Temp 97.5°F

## 2019-09-14 DIAGNOSIS — G43711 Chronic migraine without aura, intractable, with status migrainosus: Secondary | ICD-10-CM | POA: Diagnosis not present

## 2019-09-14 MED ORDER — KETOROLAC TROMETHAMINE 60 MG/2ML IM SOLN
60.0000 mg | Freq: Once | INTRAMUSCULAR | Status: AC
  Administered 2019-09-14: 60 mg via INTRAMUSCULAR

## 2019-09-14 MED ORDER — CAMBIA 50 MG PO PACK: PACK | ORAL | 11 refills | Status: DC

## 2019-09-14 NOTE — Progress Notes (Signed)
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 Pt tolerated well. Bandaid applied. See MAR.

## 2019-09-14 NOTE — Progress Notes (Signed)
Consent Form Botulism Toxin Injection For Chronic Migraine   Interval history 06/04/2019: Continues excellent response, flexeril really helping. Out of 30 days, she has had 1 migraines which is a drastic improvement. >95% decrease in frequency. She has neck pain ordered dry needling which helped. +a. Doing great on the Gralise.  Roselyn Meier did not work. Has only needed to try Nurtec once.      Consent Form Botulism Toxin Injection For Chronic Migraine    Reviewed orally with patient, additionally signature is on file:  Botulism toxin has been approved by the Federal drug administration for treatment of chronic migraine. Botulism toxin does not cure chronic migraine and it may not be effective in some patients.  The administration of botulism toxin is accomplished by injecting a small amount of toxin into the muscles of the neck and head. Dosage must be titrated for each individual. Any benefits resulting from botulism toxin tend to wear off after 3 months with a repeat injection required if benefit is to be maintained. Injections are usually done every 3-4 months with maximum effect peak achieved by about 2 or 3 weeks. Botulism toxin is expensive and you should be sure of what costs you will incur resulting from the injection.  The side effects of botulism toxin use for chronic migraine may include:   -Transient, and usually mild, facial weakness with facial injections  -Transient, and usually mild, head or neck weakness with head/neck injections  -Reduction or loss of forehead facial animation due to forehead muscle weakness  -Eyelid drooping  -Dry eye  -Pain at the site of injection or bruising at the site of injection  -Double vision  -Potential unknown long term risks  Contraindications: You should not have Botox if you are pregnant, nursing, allergic to albumin, have an infection, skin condition, or muscle weakness at the site of the injection, or have myasthenia gravis,  Lambert-Eaton syndrome, or ALS.  It is also possible that as with any injection, there may be an allergic reaction or no effect from the medication. Reduced effectiveness after repeated injections is sometimes seen and rarely infection at the injection site may occur. All care will be taken to prevent these side effects. If therapy is given over a long time, atrophy and wasting in the muscle injected may occur. Occasionally the patient's become refractory to treatment because they develop antibodies to the toxin. In this event, therapy needs to be modified.  I have read the above information and consent to the administration of botulism toxin.    BOTOX PROCEDURE NOTE FOR MIGRAINE HEADACHE    Contraindications and precautions discussed with patient(above). Aseptic procedure was observed and patient tolerated procedure. Procedure performed by Dr. Georgia Dom  The condition has existed for more than 6 months, and pt does not have a diagnosis of ALS, Myasthenia Gravis or Lambert-Eaton Syndrome.  Risks and benefits of injections discussed and pt agrees to proceed with the procedure.  Written consent obtained  These injections are medically necessary. Pt  receives good benefits from these injections. These injections do not cause sedations or hallucinations which the oral therapies may cause.  Description of procedure:  The patient was placed in a sitting position. The standard protocol was used for Botox as follows, with 5 units of Botox injected at each site:   -Procerus muscle, midline injection  -Corrugator muscle, bilateral injection  -Frontalis muscle, bilateral injection, with 2 sites each side, medial injection was performed in the upper one third of the frontalis  muscle, in the region vertical from the medial inferior edge of the superior orbital rim. The lateral injection was again in the upper one third of the forehead vertically above the lateral limbus of the cornea, 1.5 cm lateral  to the medial injection site.  -Temporalis muscle injection, 4 sites, bilaterally. The first injection was 3 cm above the tragus of the ear, second injection site was 1.5 cm to 3 cm up from the first injection site in line with the tragus of the ear. The third injection site was 1.5-3 cm forward between the first 2 injection sites. The fourth injection site was 1.5 cm posterior to the second injection site.   -Occipitalis muscle injection, 3 sites, bilaterally. The first injection was done one half way between the occipital protuberance and the tip of the mastoid process behind the ear. The second injection site was done lateral and superior to the first, 1 fingerbreadth from the first injection. The third injection site was 1 fingerbreadth superiorly and medially from the first injection site.  -Cervical paraspinal muscle injection, 2 sites, bilateral knee first injection site was 1 cm from the midline of the cervical spine, 3 cm inferior to the lower border of the occipital protuberance. The second injection site was 1.5 cm superiorly and laterally to the first injection site.  -Trapezius muscle injection was performed at 3 sites, bilaterally. The first injection site was in the upper trapezius muscle halfway between the inflection point of the neck, and the acromion. The second injection site was one half way between the acromion and the first injection site. The third injection was done between the first injection site and the inflection point of the neck.   Will return for repeat injection in 3 months.   200 units of Botox was used, any Botox not injected was wasted. The patient tolerated the procedure well, there were no complications of the above procedure.

## 2019-09-22 ENCOUNTER — Encounter: Payer: Self-pay | Admitting: *Deleted

## 2019-10-06 ENCOUNTER — Encounter: Payer: Self-pay | Admitting: *Deleted

## 2019-12-07 ENCOUNTER — Telehealth: Payer: Self-pay | Admitting: Neurology

## 2019-12-07 NOTE — Telephone Encounter (Signed)
I called [employer] [phone]) and spoke with Brandi. She informed me that patient has a PA on file, valid from 09/03/19 to 03/01/20. PA # [id number].

## 2019-12-17 ENCOUNTER — Ambulatory Visit: Payer: BLUE CROSS/BLUE SHIELD | Admitting: Neurology

## 2019-12-22 NOTE — Progress Notes (Signed)
Consent Form Botulism Toxin Injection For Chronic Migraine  Interval history 12/23/2019: Continues excellent response. >70% improvement only 1 mild migraine a month  Interval history 06/04/2019: Continues excellent response, flexeril really helping. Out of 30 days, she has had 1 migraines which is a drastic improvement. >95% decrease in frequency. She has neck pain ordered dry needling which helped. +a. Doing great on the Gralise.  Bernita Raisin did not work. Has only needed to try Nurtec once.      Consent Form Botulism Toxin Injection For Chronic Migraine    Reviewed orally with patient, additionally signature is on file:  Botulism toxin has been approved by the Federal drug administration for treatment of chronic migraine. Botulism toxin does not cure chronic migraine and it may not be effective in some patients.  The administration of botulism toxin is accomplished by injecting a small amount of toxin into the muscles of the neck and head. Dosage must be titrated for each individual. Any benefits resulting from botulism toxin tend to wear off after 3 months with a repeat injection required if benefit is to be maintained. Injections are usually done every 3-4 months with maximum effect peak achieved by about 2 or 3 weeks. Botulism toxin is expensive and you should be sure of what costs you will incur resulting from the injection.  The side effects of botulism toxin use for chronic migraine may include:   -Transient, and usually mild, facial weakness with facial injections  -Transient, and usually mild, head or neck weakness with head/neck injections  -Reduction or loss of forehead facial animation due to forehead muscle weakness  -Eyelid drooping  -Dry eye  -Pain at the site of injection or bruising at the site of injection  -Double vision  -Potential unknown long term risks  Contraindications: You should not have Botox if you are pregnant, nursing, allergic to albumin, have an  infection, skin condition, or muscle weakness at the site of the injection, or have myasthenia gravis, Lambert-Eaton syndrome, or ALS.  It is also possible that as with any injection, there may be an allergic reaction or no effect from the medication. Reduced effectiveness after repeated injections is sometimes seen and rarely infection at the injection site may occur. All care will be taken to prevent these side effects. If therapy is given over a long time, atrophy and wasting in the muscle injected may occur. Occasionally the patient's become refractory to treatment because they develop antibodies to the toxin. In this event, therapy needs to be modified.  I have read the above information and consent to the administration of botulism toxin.    BOTOX PROCEDURE NOTE FOR MIGRAINE HEADACHE    Contraindications and precautions discussed with patient(above). Aseptic procedure was observed and patient tolerated procedure. Procedure performed by Dr. Artemio Aly  The condition has existed for more than 6 months, and pt does not have a diagnosis of ALS, Myasthenia Gravis or Lambert-Eaton Syndrome.  Risks and benefits of injections discussed and pt agrees to proceed with the procedure.  Written consent obtained  These injections are medically necessary. Pt  receives good benefits from these injections. These injections do not cause sedations or hallucinations which the oral therapies may cause.  Description of procedure:  The patient was placed in a sitting position. The standard protocol was used for Botox as follows, with 5 units of Botox injected at each site:   -Procerus muscle, midline injection  -Corrugator muscle, bilateral injection  -Frontalis muscle, bilateral injection, with 2 sites  each side, medial injection was performed in the upper one third of the frontalis muscle, in the region vertical from the medial inferior edge of the superior orbital rim. The lateral injection was again in  the upper one third of the forehead vertically above the lateral limbus of the cornea, 1.5 cm lateral to the medial injection site.  -Temporalis muscle injection, 4 sites, bilaterally. The first injection was 3 cm above the tragus of the ear, second injection site was 1.5 cm to 3 cm up from the first injection site in line with the tragus of the ear. The third injection site was 1.5-3 cm forward between the first 2 injection sites. The fourth injection site was 1.5 cm posterior to the second injection site.   -Occipitalis muscle injection, 3 sites, bilaterally. The first injection was done one half way between the occipital protuberance and the tip of the mastoid process behind the ear. The second injection site was done lateral and superior to the first, 1 fingerbreadth from the first injection. The third injection site was 1 fingerbreadth superiorly and medially from the first injection site.  -Cervical paraspinal muscle injection, 2 sites, bilateral knee first injection site was 1 cm from the midline of the cervical spine, 3 cm inferior to the lower border of the occipital protuberance. The second injection site was 1.5 cm superiorly and laterally to the first injection site.  -Trapezius muscle injection was performed at 3 sites, bilaterally. The first injection site was in the upper trapezius muscle halfway between the inflection point of the neck, and the acromion. The second injection site was one half way between the acromion and the first injection site. The third injection was done between the first injection site and the inflection point of the neck.   Will return for repeat injection in 3 months.   200 units of Botox was used, any Botox not injected was wasted. The patient tolerated the procedure well, there were no complications of the above procedure.

## 2019-12-23 ENCOUNTER — Ambulatory Visit (INDEPENDENT_AMBULATORY_CARE_PROVIDER_SITE_OTHER): Payer: BC Managed Care – PPO | Admitting: Neurology

## 2019-12-23 DIAGNOSIS — G43711 Chronic migraine without aura, intractable, with status migrainosus: Secondary | ICD-10-CM | POA: Diagnosis not present

## 2019-12-23 MED ORDER — KETOROLAC TROMETHAMINE 60 MG/2ML IM SOLN
60.0000 mg | Freq: Once | INTRAMUSCULAR | Status: AC
  Administered 2019-12-23: 60 mg via INTRAMUSCULAR

## 2019-12-23 NOTE — Progress Notes (Addendum)
Botox- 200 units x 1 vial Lot: [id number] Expiration: 06/2022 NDC: 4784-1282-08  Bacteriostatic 0.9% Sodium Chloride- 47mL total Lot: [id number] Expiration: 12/24/2019 NDC: 9597-4718-55  Dx: M15.868 B/B   Toradol 60 mg IM administered per v.o. Dr. Lucia Gaskins in R Ventrogluteal muscle. Pt tolerated well. Bandaid applied. See MAR.

## 2019-12-23 NOTE — Telephone Encounter (Signed)
Patient has a Botox appointment today. I will enroll patient with [facility] for Botox delivery for her next Botox appointment on 10/12. I will get MD to sign and I will make patient aware of this change.

## 2019-12-29 NOTE — Telephone Encounter (Signed)
MD signed [facility] enrollment form. I faxed to [facility] at [phone].

## 2020-01-21 DIAGNOSIS — G43909 Migraine, unspecified, not intractable, without status migrainosus: Secondary | ICD-10-CM | POA: Diagnosis not present

## 2020-01-21 DIAGNOSIS — K900 Celiac disease: Secondary | ICD-10-CM | POA: Diagnosis not present

## 2020-01-21 DIAGNOSIS — Z13220 Encounter for screening for lipoid disorders: Secondary | ICD-10-CM | POA: Diagnosis not present

## 2020-01-21 DIAGNOSIS — F411 Generalized anxiety disorder: Secondary | ICD-10-CM | POA: Diagnosis not present

## 2020-03-10 DIAGNOSIS — Z20822 Contact with and (suspected) exposure to covid-19: Secondary | ICD-10-CM | POA: Diagnosis not present

## 2020-03-15 DIAGNOSIS — Z20828 Contact with and (suspected) exposure to other viral communicable diseases: Secondary | ICD-10-CM | POA: Diagnosis not present

## 2020-03-21 ENCOUNTER — Other Ambulatory Visit: Payer: Self-pay | Admitting: Neurology

## 2020-03-28 ENCOUNTER — Telehealth: Payer: Self-pay | Admitting: Neurology

## 2020-03-28 NOTE — Telephone Encounter (Signed)
Patient has Botox appointment on 10/12. [employer] Botox PA expired on 9/7. I filled out continuation PA and gave to MD to sign. I called the patient to advise her to be aware of a phone call from the specialty pharmacy ([facility]).

## 2020-03-28 NOTE — Telephone Encounter (Signed)
Received signed PA form. Faxed to [employer].

## 2020-03-29 NOTE — Telephone Encounter (Signed)
I called [facility] and spoke with Wynona Canes to see if Botox delivery can be scheduled. Wynona Canes states patient has not given consent. Christine placed me on hold and attempted to contact the patient. She states patient did not answer.

## 2020-03-31 ENCOUNTER — Other Ambulatory Visit: Payer: Self-pay

## 2020-03-31 ENCOUNTER — Encounter: Payer: Self-pay | Admitting: Family Medicine

## 2020-03-31 ENCOUNTER — Ambulatory Visit (INDEPENDENT_AMBULATORY_CARE_PROVIDER_SITE_OTHER): Payer: BC Managed Care – PPO | Admitting: Family Medicine

## 2020-03-31 VITALS — BP 106/74 | HR 93 | Ht 64.0 in | Wt 140.0 lb

## 2020-03-31 DIAGNOSIS — M999 Biomechanical lesion, unspecified: Secondary | ICD-10-CM

## 2020-03-31 DIAGNOSIS — M542 Cervicalgia: Secondary | ICD-10-CM | POA: Diagnosis not present

## 2020-03-31 MED ORDER — GABAPENTIN 100 MG PO CAPS: 200.0000 mg | ORAL_CAPSULE | Freq: Every day | ORAL | 0 refills | Status: DC

## 2020-03-31 NOTE — Assessment & Plan Note (Signed)

## 2020-03-31 NOTE — Telephone Encounter (Signed)
I called [facility] to see if shipment is ready to schedule. I spoke with Almira Coaster, who states patient has still not given consent. I called the patient and LVM for her to advise that if she does not provide consent, we will have to reschedule her Botox appointment until her Botox arrives from the Specialty Pharm[facility].

## 2020-03-31 NOTE — Patient Instructions (Signed)
PT [address] Gabapentin 200mg  Vit D 2000 IU daily Turmeric 500mg  daily Standing on wall for 5 minutes a day Monitor at eye level See me again in 5-6 weeks

## 2020-03-31 NOTE — Assessment & Plan Note (Signed)
Patient does have an MRI that was independently visualized by me showing the patient does have some degenerative disc disease of mild to moderate in nature.  I do believe a lot of this is more postural related.  We discussed ergonomics.  Low-dose gabapentin given, the over-the-counter medicines, patient will work with physical therapy and was referred today.  Mild home exercises also given.  Follow-up with me again in 4 to 6 weeks.  Attempted osteopathic manipulation with some good resolution of pain.

## 2020-03-31 NOTE — Progress Notes (Signed)
[name] [address] [place] [address] Phone: [phone] Subjective:   [name], am serving as a scribe for Dr. Antoine Primas. This visit occurred during the SARS-CoV-2 public health emergency.  Safety protocols were in place, including screening questions prior to the visit, additional usage of staff PPE, and extensive cleaning of exam room while observing appropriate contact time as indicated for disinfecting solutions.   I'm seeing this patient by the request  of:  Abigail Has, MD  CC: Neck pain upper back pain  EPP:[id number]  [name] is a 44 y.o. female coming in with complaint of bilateral shoulder pain. Patient suffers from migraines due to neck pain. States that she [name] pain in bilateral trap muscles. Using Flexeril and Botox injections. Also complaining of bilateral scapular pain and tingling that is intermittent.  Patient feels like it is aggravated when she is working more.  MRI 2019 IMPRESSION: 1. Degenerative disc disease at C5-6 with slight disc space narrowing and a small broad-based disc protrusion slightly encroaching upon both lateral recesses, best demonstrated on image 15 of series 6. 2. Otherwise, essentially normal MRI of the cervical spine.    Past Medical History:  Diagnosis Date  . Anxiety   . Celiac sprue   . Headache    migraines  . TMJ arthralgia    Past Surgical History:  Procedure Laterality Date  . lymph node removal  2000   Social History   Socioeconomic History  . Marital status: Married    Spouse name: [name]  . Number of children: 2  . Years of education: 58  . Highest education level: Not on file  Occupational History  . Occupation: Surveyor, minerals: [employer]  Tobacco Use  . Smoking status: Former Smoker    Quit date: 06/28/1999    Years since quitting: 20.7  . Smokeless tobacco: Never Used  Vaping Use  . Vaping Use: Never used  Substance and  Sexual Activity  . Alcohol use: No    Comment: 1-2/year  . Drug use: No  . Sexual activity: Not on file  Other Topics Concern  . Not on file  Social History Narrative   Married 2 children   Right handed   BSBA   3-4 cups daily, 11/23/16 decreased by 2/3    Social Determinants of Health   Financial Resource Strain:   . Difficulty of Paying Living Expenses: Not on file  Food Insecurity:   . Worried About Programme researcher, broadcasting/film/video in the Last Year: Not on file  . Ran Out of Food in the Last Year: Not on file  Transportation Needs:   . Lack of Transportation (Medical): Not on file  . Lack of Transportation (Non-Medical): Not on file  Physical Activity:   . Days of Exercise per Week: Not on file  . Minutes of Exercise per Session: Not on file  Stress:   . Feeling of Stress : Not on file  Social Connections:   . Frequency of Communication with Friends and Family: Not on file  . Frequency of Social Gatherings with Friends and Family: Not on file  . Attends Religious Services: Not on file  . Active Member of Clubs or Organizations: Not on file  . Attends Banker Meetings: Not on file  . Marital Status: Not on file   Allergies  Allergen Reactions  . Topamax [Topiramate] Other (See Comments)    Couldn't function  Family History  Problem Relation Age of Onset  . Hypertension Mother   . Diabetes Mother   . Celiac disease Mother   . Coronary artery disease Maternal Grandfather   . Pulmonary fibrosis Father     Current Outpatient Medications (Endocrine & Metabolic):  .  levonorgestrel (MIRENA) 20 MCG/24HR IUD, 1 each by Intrauterine route once. .  predniSONE (DELTASONE) 10 MG tablet, Take 2 tablets (20 mg total) by mouth daily.    Current Outpatient Medications (Analgesics):  Marland Kitchen  Diclofenac Potassium,Migraine, (CAMBIA) 50 MG PACK, MIX ONE PACKET WITH 1-2 OUNCES OF WATER AS NEEDED FOR MIGRAINE. DRINK IMMEDIATELY AS A SINGLE DOSE. .  rizatriptan (MAXALT-MLT) 10 MG  disintegrating tablet, DISSOLVE 1 TABLET IN THE MOUTH AS NEEDED FOR MIGRAINE MAY REPEAT IN 2 HOURS AS NEEDED MAX DAILY DOSE IN 24 HOURS   Current Outpatient Medications (Other):  Marland Kitchen  ALPRAZolam (XANAX) 0.25 MG tablet, 0.25 mg as needed. .  cyclobenzaprine (FLEXERIL) 10 MG tablet, TAKE 1 TABLET(10 MG) BY MOUTH THREE TIMES DAILY AS NEEDED FOR MUSCLE SPASMS .  hyoscyamine (LEVSIN SL) 0.125 MG SL tablet, DISSOLVE 1 TABLET UNDER THE TONGUE EVERY 4 HOURS AS NEEDED FOR ABDOMINAL CRAMPING .  linaclotide (LINZESS) 290 MCG CAPS capsule, Take 290 mcg by mouth daily. .  Multiple Vitamin (MULTIVITAMIN) tablet, Take 1 tablet by mouth daily. Marland Kitchen  OVER THE COUNTER MEDICATION, Ultra flora 1B Probiotic .  gabapentin (NEURONTIN) 100 MG capsule, Take 2 capsules (200 mg total) by mouth at bedtime.   Reviewed prior external information including notes and imaging from  primary care provider As well as notes that were available from care everywhere and other healthcare systems.  Past medical history, social, surgical and family history all reviewed in electronic medical record.  No pertanent information unless stated regarding to the chief complaint.   Review of Systems:  No headache, visual changes, nausea, vomiting, diarrhea, constipation, dizziness, abdominal pain, skin rash, fevers, chills, night sweats, weight loss, swollen lymph nodes, body aches, joint swelling, chest pain, shortness of breath, mood changes. POSITIVE muscle aches  Objective  Blood pressure 106/74, pulse 93, height 5\' 4"  (1.626 m), weight 140 lb (63.5 kg), SpO2 99 %.   General: No apparent distress alert and oriented x3 mood and affect normal, dressed appropriately.  HEENT: Pupils equal, extraocular movements intact  Respiratory: Patient's speak in full sentences and does not appear short of breath  Cardiovascular: No lower extremity edema, non tender, no erythema  Neuro: Cranial nerves II through XII are intact, neurovascularly intact in  all extremities with 2+ DTRs and 2+ pulses.  Gait normal with good balance and coordination.  MSK:  Non tender with full range of motion and good stability and symmetric strength and tone of shoulders, elbows, wrist, hip, knee and ankles bilaterally.  Neck exam does show the patient does have some mild loss of lordosis.  Patient does have some tightness with sidebending.  Pain in the parascapular region.  Some mild scapular dyskinesis noted bilaterally.  Poor posture noted.  Negative Spurling's of the neck.  Osteopathic findings  C3 flexed rotated and side bent left C7 flexed rotated and side bent left T4 extended rotated and side bent right inhaled rib L2 flexed rotated and side bent right Sacrum right on right    Impression and Recommendations:     The above documentation [name] been reviewed and is accurate and complete , DO

## 2020-03-31 NOTE — Telephone Encounter (Signed)
Received PA approval via fax. PA #[id number] (03/28/20- 03/27/21). PA is authorized for specialty pharmacy use ([facility]).

## 2020-04-05 ENCOUNTER — Ambulatory Visit (INDEPENDENT_AMBULATORY_CARE_PROVIDER_SITE_OTHER): Payer: BC Managed Care – PPO | Admitting: Neurology

## 2020-04-05 DIAGNOSIS — G43711 Chronic migraine without aura, intractable, with status migrainosus: Secondary | ICD-10-CM | POA: Diagnosis not present

## 2020-04-05 MED ORDER — KETOROLAC TROMETHAMINE 60 MG/2ML IM SOLN
60.0000 mg | Freq: Once | INTRAMUSCULAR | Status: AC
  Administered 2020-04-05: 60 mg via INTRAMUSCULAR

## 2020-04-05 NOTE — Progress Notes (Signed)
Botox- 200 units x 1 vial Lot: [id number] Expiration: 09/2022 NDC: 0413-6438-37  Bacteriostatic 0.9% Sodium Chloride- 19mL total Lot: [id number] Expiration: 03/25/2021 NDC: 8648-4720-72  Dx: T82.883 B/B  Toradol 60 mg IM administered per v.o. Dr. Lucia Gaskins in R Ventrogluteal muscle. Pt tolerated well. Bandaid applied. See MAR.

## 2020-04-05 NOTE — Progress Notes (Signed)
Consent Form Botulism Toxin Injection For Chronic Migraine   04/05/2020: Still excellent > 70% improvemen tin migraine frequency.   Interval history 12/23/2019: Continues excellent response. >70% improvement only 1 mild migraine a month. She saw [name] at [facility] and feels great and is also having dry needling again.   Interval history 06/04/2019: Continues excellent response, flexeril really helping. Out of 30 days, she has had 1 migraines which is a drastic improvement. >95% decrease in frequency. She has neck pain ordered dry needling which helped. +a. Doing great on the Gralise.  Bernita Raisin did not work. Has only needed to try Nurtec once.      Consent Form Botulism Toxin Injection For Chronic Migraine    Reviewed orally with patient, additionally signature is on file:  Botulism toxin has been approved by the Federal drug administration for treatment of chronic migraine. Botulism toxin does not cure chronic migraine and it may not be effective in some patients.  The administration of botulism toxin is accomplished by injecting a small amount of toxin into the muscles of the neck and head. Dosage must be titrated for each individual. Any benefits resulting from botulism toxin tend to wear off after 3 months with a repeat injection required if benefit is to be maintained. Injections are usually done every 3-4 months with maximum effect peak achieved by about 2 or 3 weeks. Botulism toxin is expensive and you should be sure of what costs you will incur resulting from the injection.  The side effects of botulism toxin use for chronic migraine may include:   -Transient, and usually mild, facial weakness with facial injections  -Transient, and usually mild, head or neck weakness with head/neck injections  -Reduction or loss of forehead facial animation due to forehead muscle weakness  -Eyelid drooping  -Dry eye  -Pain at the site of injection or bruising at the site  of injection  -Double vision  -Potential unknown long term risks  Contraindications: You should not have Botox if you are pregnant, nursing, allergic to albumin, have an infection, skin condition, or muscle weakness at the site of the injection, or have myasthenia gravis, Lambert-Eaton syndrome, or ALS.  It is also possible that as with any injection, there may be an allergic reaction or no effect from the medication. Reduced effectiveness after repeated injections is sometimes seen and rarely infection at the injection site may occur. All care will be taken to prevent these side effects. If therapy is given over a long time, atrophy and wasting in the muscle injected may occur. Occasionally the patient's become refractory to treatment because they develop antibodies to the toxin. In this event, therapy needs to be modified.  I have read the above information and consent to the administration of botulism toxin.    BOTOX PROCEDURE NOTE FOR MIGRAINE HEADACHE    Contraindications and precautions discussed with patient(above). Aseptic procedure was observed and patient tolerated procedure. Procedure performed by Dr. Artemio Aly  The condition has existed for more than 6 months, and pt does not have a diagnosis of ALS, Myasthenia Gravis or Lambert-Eaton Syndrome.  Risks and benefits of injections discussed and pt agrees to proceed with the procedure.  Written consent obtained  These injections are medically necessary. Pt  receives good benefits from these injections. These injections do not cause sedations or hallucinations which the oral therapies may cause.  Description of procedure:  The patient was placed in a sitting position. The standard protocol was used for  Botox as follows, with 5 units of Botox injected at each site:   -Procerus muscle, midline injection  -Corrugator muscle, bilateral injection  -Frontalis muscle, bilateral injection, with 2 sites each side, medial injection was  performed in the upper one third of the frontalis muscle, in the region vertical from the medial inferior edge of the superior orbital rim. The lateral injection was again in the upper one third of the forehead vertically above the lateral limbus of the cornea, 1.5 cm lateral to the medial injection site.  -Temporalis muscle injection, 4 sites, bilaterally. The first injection was 3 cm above the tragus of the ear, second injection site was 1.5 cm to 3 cm up from the first injection site in line with the tragus of the ear. The third injection site was 1.5-3 cm forward between the first 2 injection sites. The fourth injection site was 1.5 cm posterior to the second injection site.   -Occipitalis muscle injection, 3 sites, bilaterally. The first injection was done one half way between the occipital protuberance and the tip of the mastoid process behind the ear. The second injection site was done lateral and superior to the first, 1 fingerbreadth from the first injection. The third injection site was 1 fingerbreadth superiorly and medially from the first injection site.  -Cervical paraspinal muscle injection, 2 sites, bilateral knee first injection site was 1 cm from the midline of the cervical spine, 3 cm inferior to the lower border of the occipital protuberance. The second injection site was 1.5 cm superiorly and laterally to the first injection site.  -Trapezius muscle injection was performed at 3 sites, bilaterally. The first injection site was in the upper trapezius muscle halfway between the inflection point of the neck, and the acromion. The second injection site was one half way between the acromion and the first injection site. The third injection was done between the first injection site and the inflection point of the neck.   Will return for repeat injection in 3 months.   200 units of Botox was used, any Botox not injected was wasted. The patient tolerated the procedure well, there were no  complications of the above procedure.

## 2020-04-05 NOTE — Telephone Encounter (Signed)
Patient will be B/B for 10/12 appointment. [facility] was not able to deliver medication as scheduled (delivery was scheduled for 10/12). I called [facility] this morning and was informed that there was an error in processing her prior authorization, and shipment will be rescheduled. If medication arrives, it will be used for future Botox injection.

## 2020-04-15 DIAGNOSIS — Z20828 Contact with and (suspected) exposure to other viral communicable diseases: Secondary | ICD-10-CM | POA: Diagnosis not present

## 2020-04-20 NOTE — Telephone Encounter (Signed)
Patient has not yet seen my MyChart message so I called pt and LVM reminding patient to call the specialty pharmacy.

## 2020-05-09 ENCOUNTER — Ambulatory Visit: Payer: BC Managed Care – PPO | Admitting: Family Medicine

## 2020-06-06 ENCOUNTER — Ambulatory Visit: Payer: BC Managed Care – PPO | Admitting: Family Medicine

## 2020-06-06 NOTE — Progress Notes (Deleted)
  [name] [address] [place] [address] Phone: [phone] Subjective:    I'm seeing this patient by the request  of:  Farris Has, MD  CC:   [id number]  [name] is a 43 y.o. female coming in with complaint of back and neck pain. OMT 03/31/2020. Patient states   Medications patient has been prescribed: Gabapentin  Taking:         Reviewed prior external information including notes and imaging from previsou exam, outside providers and external EMR if available.   As well as notes that were available from care everywhere and other healthcare systems.  Past medical history, social, surgical and family history all reviewed in electronic medical record.  No pertanent information unless stated regarding to the chief complaint.   Past Medical History:  Diagnosis Date  . Anxiety   . Celiac sprue   . Headache    migraines  . TMJ arthralgia     Allergies  Allergen Reactions  . Topamax [Topiramate] Other (See Comments)    Couldn't function     Review of Systems:  No headache, visual changes, nausea, vomiting, diarrhea, constipation, dizziness, abdominal pain, skin rash, fevers, chills, night sweats, weight loss, swollen lymph nodes, body aches, joint swelling, chest pain, shortness of breath, mood changes. POSITIVE muscle aches  Objective  There were no vitals taken for this visit.   General: No apparent distress alert and oriented x3 mood and affect normal, dressed appropriately.  HEENT: Pupils equal, extraocular movements intact  Respiratory: Patient's speak in full sentences and does not appear short of breath  Cardiovascular: No lower extremity edema, non tender, no erythema  Neuro: Cranial nerves II through XII are intact, neurovascularly intact in all extremities with 2+ DTRs and 2+ pulses.  Gait normal with good balance and coordination.  MSK:  Non tender with full range of motion and good stability and  symmetric strength and tone of shoulders, elbows, wrist, hip, knee and ankles bilaterally.  Back - Normal skin, Spine with normal alignment and no deformity.  No tenderness to vertebral process palpation.  Paraspinous muscles are not tender and without spasm.   Range of motion is full at neck and lumbar sacral regions  Osteopathic findings  C2 flexed rotated and side bent right C6 flexed rotated and side bent left T3 extended rotated and side bent right inhaled rib T9 extended rotated and side bent left L2 flexed rotated and side bent right Sacrum right on right       Assessment and Plan:    Nonallopathic problems  Decision today to treat with OMT was based on Physical Exam  After verbal consent patient was treated with HVLA, ME, FPR techniques in cervical, rib, thoracic, lumbar, and sacral  areas  Patient tolerated the procedure well with improvement in symptoms  Patient given exercises, stretches and lifestyle modifications  See medications in patient instructions if given  Patient will follow up in 4-8 weeks      The above documentation has been reviewed and is accurate and complete Wilford Grist       Note: This dictation was prepared with Dragon dictation along with smaller phrase technology. Any transcriptional errors that result from this process are unintentional.

## 2020-06-26 ENCOUNTER — Other Ambulatory Visit: Payer: Self-pay | Admitting: Family Medicine

## 2020-06-27 ENCOUNTER — Telehealth: Payer: Self-pay | Admitting: Neurology

## 2020-06-27 NOTE — Telephone Encounter (Signed)
Patient has a Botox appointment on 1/12. I called [facility] and spoke with Hilda Lias to see if Botox delivery could be scheduled. Hilda Lias states that the order is ready to be scheduled but patient needs to give consent. She placed me on hold and attempted to contact the patient. The attempt was unsuccessful but Hilda Lias states she LVM.

## 2020-06-27 NOTE — Telephone Encounter (Signed)
Refill done.  

## 2020-06-29 NOTE — Telephone Encounter (Signed)
I called [facility] and spoke with Kiana to check on the order. Patient still had not given consent so I called her and was able to initiate a conference call. Patient gave permission to ship. Botox TBD 1/11.

## 2020-07-04 NOTE — Progress Notes (Signed)
[name] [address] [place] [address] Phone: [phone] Subjective:   I [name] am serving as a Neurosurgeon for Dr. Antoine Primas.  This visit occurred during the SARS-CoV-2 public health emergency.  Safety protocols were in place, including screening questions prior to the visit, additional usage of staff PPE, and extensive cleaning of exam room while observing appropriate contact time as indicated for disinfecting solutions.   I'm seeing this patient by the request  of:  Farris Has, MD  CC: Neck pain follow-up  [id number]  [name] is a 45 y.o. female coming in with complaint of back and neck pain. OMT 03/31/2020. Patient states she hasn't been a good patient. Started out doing well but states she hasn't been doing what she needs to. Feeling about the same.  Patient has had stress over the course of the holidays.  Medications patient has been prescribed: Gabapentin  Taking: Yes         Reviewed prior external information including notes and imaging from previsou exam, outside providers and external EMR if available.   As well as notes that were available from care everywhere and other healthcare systems.  Past medical history, social, surgical and family history all reviewed in electronic medical record.  No pertanent information unless stated regarding to the chief complaint.   Past Medical History:  Diagnosis Date  . Anxiety   . Celiac sprue   . Headache    migraines  . TMJ arthralgia     Allergies  Allergen Reactions  . Topamax [Topiramate] Other (See Comments)    Couldn't function     Review of Systems:  No headache, visual changes, nausea, vomiting, diarrhea, constipation, dizziness, abdominal pain, skin rash, fevers, chills, night sweats, weight loss, swollen lymph nodes, body aches, joint swelling, chest pain, shortness of breath, mood changes. POSITIVE muscle aches  Objective  Blood pressure  102/80, pulse 83, height 5\' 4"  (1.626 m), weight 125 lb (56.7 kg), SpO2 99 %.   General: No apparent distress alert and oriented x3 mood and affect normal, dressed appropriately.  HEENT: Pupils equal, extraocular movements intact  Respiratory: Patient's speak in full sentences and does not appear short of breath  Cardiovascular: No lower extremity edema, non tender, no erythema   Back -    Osteopathic findings  C6 flexed rotated and side bent left T3 extended rotated and side bent right inhaled rib T5 extended rotated and side bent left        Assessment and Plan:  Cervicalgia Patient does have some cervical pain still.  MRI of the degenerative disc was shown at C5-C6 from 2019.  We will consider the possibility of repeating x-rays.  Continue the gabapentin.  Patient has been noncompliant with everything else attempted osteopathic manipulation again with good range of motion.  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  We discussed the potential for advanced imaging but I think it is highly unlikely.  The patient does not want to repeat the formal physical therapy.  Follow-up again in 4 to 6 weeks    Nonallopathic problems  Decision today to treat with OMT was based on Physical Exam  After verbal consent patient was treated with HVLA, ME, FPR techniques in cervical, rib, thoracic,  areas  Patient tolerated the procedure well with improvement in symptoms  Patient given  exercises, stretches and lifestyle modifications  See medications in patient instructions if given  Patient will follow up in 4-8 weeks     The above documentation has been reviewed and is accurate and complete Judi Saa, DO       Note: This dictation was prepared with Dragon dictation along with smaller phrase technology. Any transcriptional errors that result from this  process are unintentional.

## 2020-07-05 ENCOUNTER — Other Ambulatory Visit: Payer: Self-pay

## 2020-07-05 ENCOUNTER — Encounter: Payer: Self-pay | Admitting: Family Medicine

## 2020-07-05 ENCOUNTER — Ambulatory Visit (INDEPENDENT_AMBULATORY_CARE_PROVIDER_SITE_OTHER): Payer: BC Managed Care – PPO | Admitting: Family Medicine

## 2020-07-05 VITALS — BP 102/80 | HR 83 | Ht 64.0 in | Wt 125.0 lb

## 2020-07-05 DIAGNOSIS — M542 Cervicalgia: Secondary | ICD-10-CM

## 2020-07-05 DIAGNOSIS — M999 Biomechanical lesion, unspecified: Secondary | ICD-10-CM

## 2020-07-05 DIAGNOSIS — G43711 Chronic migraine without aura, intractable, with status migrainosus: Secondary | ICD-10-CM | POA: Diagnosis not present

## 2020-07-05 NOTE — Telephone Encounter (Addendum)
(  1) 200U vial of Botox delivered today from [facility]. 

## 2020-07-05 NOTE — Assessment & Plan Note (Signed)
Patient does have some cervical pain still.  MRI of the degenerative disc was shown at C5-C6 from 2019.  We will consider the possibility of repeating x-rays.  Continue the gabapentin.  Patient has been noncompliant with everything else attempted osteopathic manipulation again with good range of motion.  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  We discussed the potential for advanced imaging but I think it is highly unlikely.  The patient does not want to repeat the formal physical therapy.  Follow-up again in 4 to 6 weeks

## 2020-07-05 NOTE — Patient Instructions (Addendum)
Good to see you Scapular exercises  Try the exercises  See me again in 5-6 weeks if not better lets consider PT, new xrays and potentially labs

## 2020-07-05 NOTE — Progress Notes (Deleted)
Consent Form Botulism Toxin Injection For Chronic Migraine   04/05/2020: Still excellent > 70% improvemen tin migraine frequency.   Interval history 12/23/2019: Continues excellent response. >70% improvement only 1 mild migraine a month. She saw zach smith at [facility] and feels great and is also having dry needling again.   Interval history 06/04/2019: Continues excellent response, flexeril really helping. Out of 30 days, she has had 1 migraines which is a drastic improvement. >95% decrease in frequency. She has neck pain ordered dry needling which helped. +a. Doing great on the Gralise.  Bernita Raisin did not work. Has only needed to try Nurtec once.      Consent Form Botulism Toxin Injection For Chronic Migraine    Reviewed orally with patient, additionally signature is on file:  Botulism toxin has been approved by the Federal drug administration for treatment of chronic migraine. Botulism toxin does not cure chronic migraine and it may not be effective in some patients.  The administration of botulism toxin is accomplished by injecting a small amount of toxin into the muscles of the neck and head. Dosage must be titrated for each individual. Any benefits resulting from botulism toxin tend to wear off after 3 months with a repeat injection required if benefit is to be maintained. Injections are usually done every 3-4 months with maximum effect peak achieved by about 2 or 3 weeks. Botulism toxin is expensive and you should be sure of what costs you will incur resulting from the injection.  The side effects of botulism toxin use for chronic migraine may include:   -Transient, and usually mild, facial weakness with facial injections  -Transient, and usually mild, head or neck weakness with head/neck injections  -Reduction or loss of forehead facial animation due to forehead muscle weakness  -Eyelid drooping  -Dry eye  -Pain at the site of injection or bruising at the site  of injection  -Double vision  -Potential unknown long term risks  Contraindications: You should not have Botox if you are pregnant, nursing, allergic to albumin, have an infection, skin condition, or muscle weakness at the site of the injection, or have myasthenia gravis, Lambert-Eaton syndrome, or ALS.  It is also possible that as with any injection, there may be an allergic reaction or no effect from the medication. Reduced effectiveness after repeated injections is sometimes seen and rarely infection at the injection site may occur. All care will be taken to prevent these side effects. If therapy is given over a long time, atrophy and wasting in the muscle injected may occur. Occasionally the patient's become refractory to treatment because they develop antibodies to the toxin. In this event, therapy needs to be modified.  I have read the above information and consent to the administration of botulism toxin.    BOTOX PROCEDURE NOTE FOR MIGRAINE HEADACHE    Contraindications and precautions discussed with patient(above). Aseptic procedure was observed and patient tolerated procedure. Procedure performed by Dr. Artemio Aly  The condition has existed for more than 6 months, and pt does not have a diagnosis of ALS, Myasthenia Gravis or Lambert-Eaton Syndrome.  Risks and benefits of injections discussed and pt agrees to proceed with the procedure.  Written consent obtained  These injections are medically necessary. Pt  receives good benefits from these injections. These injections do not cause sedations or hallucinations which the oral therapies may cause.  Description of procedure:  The patient was placed in a sitting position. The standard protocol was used for  Botox as follows, with 5 units of Botox injected at each site:   -Procerus muscle, midline injection  -Corrugator muscle, bilateral injection  -Frontalis muscle, bilateral injection, with 2 sites each side, medial injection was  performed in the upper one third of the frontalis muscle, in the region vertical from the medial inferior edge of the superior orbital rim. The lateral injection was again in the upper one third of the forehead vertically above the lateral limbus of the cornea, 1.5 cm lateral to the medial injection site.  -Temporalis muscle injection, 4 sites, bilaterally. The first injection was 3 cm above the tragus of the ear, second injection site was 1.5 cm to 3 cm up from the first injection site in line with the tragus of the ear. The third injection site was 1.5-3 cm forward between the first 2 injection sites. The fourth injection site was 1.5 cm posterior to the second injection site.   -Occipitalis muscle injection, 3 sites, bilaterally. The first injection was done one half way between the occipital protuberance and the tip of the mastoid process behind the ear. The second injection site was done lateral and superior to the first, 1 fingerbreadth from the first injection. The third injection site was 1 fingerbreadth superiorly and medially from the first injection site.  -Cervical paraspinal muscle injection, 2 sites, bilateral knee first injection site was 1 cm from the midline of the cervical spine, 3 cm inferior to the lower border of the occipital protuberance. The second injection site was 1.5 cm superiorly and laterally to the first injection site.  -Trapezius muscle injection was performed at 3 sites, bilaterally. The first injection site was in the upper trapezius muscle halfway between the inflection point of the neck, and the acromion. The second injection site was one half way between the acromion and the first injection site. The third injection was done between the first injection site and the inflection point of the neck.   Will return for repeat injection in 3 months.   200 units of Botox was used, any Botox not injected was wasted. The patient tolerated the procedure well, there were no  complications of the above procedure.    

## 2020-07-06 ENCOUNTER — Telehealth: Payer: Self-pay | Admitting: Neurology

## 2020-07-06 ENCOUNTER — Ambulatory Visit (INDEPENDENT_AMBULATORY_CARE_PROVIDER_SITE_OTHER): Payer: BC Managed Care – PPO | Admitting: Neurology

## 2020-07-06 ENCOUNTER — Ambulatory Visit: Payer: Self-pay | Admitting: Neurology

## 2020-07-06 DIAGNOSIS — G43711 Chronic migraine without aura, intractable, with status migrainosus: Secondary | ICD-10-CM | POA: Diagnosis not present

## 2020-07-06 MED ORDER — KETOROLAC TROMETHAMINE 60 MG/2ML IM SOLN
60.0000 mg | Freq: Once | INTRAMUSCULAR | Status: AC
  Administered 2020-07-06: 60 mg via INTRAMUSCULAR

## 2020-07-06 NOTE — Progress Notes (Signed)
Consent Form Botulism Toxin Injection For Chronic Migraine  07/06/2020: Still excellent > 70% improvement in migraine frequency.   04/05/2020: Still excellent > 70% improvemen tin migraine frequency.   Interval history 12/23/2019: Continues excellent response. >70% improvement only 1 mild migraine a month. She saw zach smith at [facility] and feels great and is also having dry needling again.   Interval history 06/04/2019: Continues excellent response, flexeril really helping. Out of 30 days, she has had 1 migraines which is a drastic improvement. >95% decrease in frequency. She has neck pain ordered dry needling which helped. +a. Doing great on the Gralise.  Bernita Raisin did not work. Has only needed to try Nurtec once.      Consent Form Botulism Toxin Injection For Chronic Migraine    Reviewed orally with patient, additionally signature is on file:  Botulism toxin has been approved by the Federal drug administration for treatment of chronic migraine. Botulism toxin does not cure chronic migraine and it may not be effective in some patients.  The administration of botulism toxin is accomplished by injecting a small amount of toxin into the muscles of the neck and head. Dosage must be titrated for each individual. Any benefits resulting from botulism toxin tend to wear off after 3 months with a repeat injection required if benefit is to be maintained. Injections are usually done every 3-4 months with maximum effect peak achieved by about 2 or 3 weeks. Botulism toxin is expensive and you should be sure of what costs you will incur resulting from the injection.  The side effects of botulism toxin use for chronic migraine may include:   -Transient, and usually mild, facial weakness with facial injections  -Transient, and usually mild, head or neck weakness with head/neck injections  -Reduction or loss of forehead facial animation due to forehead muscle weakness  -Eyelid  drooping  -Dry eye  -Pain at the site of injection or bruising at the site of injection  -Double vision  -Potential unknown long term risks  Contraindications: You should not have Botox if you are pregnant, nursing, allergic to albumin, have an infection, skin condition, or muscle weakness at the site of the injection, or have myasthenia gravis, Lambert-Eaton syndrome, or ALS.  It is also possible that as with any injection, there may be an allergic reaction or no effect from the medication. Reduced effectiveness after repeated injections is sometimes seen and rarely infection at the injection site may occur. All care will be taken to prevent these side effects. If therapy is given over a long time, atrophy and wasting in the muscle injected may occur. Occasionally the patient's become refractory to treatment because they develop antibodies to the toxin. In this event, therapy needs to be modified.  I have read the above information and consent to the administration of botulism toxin.    BOTOX PROCEDURE NOTE FOR MIGRAINE HEADACHE    Contraindications and precautions discussed with patient(above). Aseptic procedure was observed and patient tolerated procedure. Procedure performed by Dr. Artemio Aly  The condition has existed for more than 6 months, and pt does not have a diagnosis of ALS, Myasthenia Gravis or Lambert-Eaton Syndrome.  Risks and benefits of injections discussed and pt agrees to proceed with the procedure.  Written consent obtained  These injections are medically necessary. Pt  receives good benefits from these injections. These injections do not cause sedations or hallucinations which the oral therapies may cause.  Description of procedure:  The patient was placed  in a sitting position. The standard protocol was used for Botox as follows, with 5 units of Botox injected at each site:   -Procerus muscle, midline injection  -Corrugator muscle, bilateral  injection  -Frontalis muscle, bilateral injection, with 2 sites each side, medial injection was performed in the upper one third of the frontalis muscle, in the region vertical from the medial inferior edge of the superior orbital rim. The lateral injection was again in the upper one third of the forehead vertically above the lateral limbus of the cornea, 1.5 cm lateral to the medial injection site.  -Temporalis muscle injection, 4 sites, bilaterally. The first injection was 3 cm above the tragus of the ear, second injection site was 1.5 cm to 3 cm up from the first injection site in line with the tragus of the ear. The third injection site was 1.5-3 cm forward between the first 2 injection sites. The fourth injection site was 1.5 cm posterior to the second injection site.   -Occipitalis muscle injection, 3 sites, bilaterally. The first injection was done one half way between the occipital protuberance and the tip of the mastoid process behind the ear. The second injection site was done lateral and superior to the first, 1 fingerbreadth from the first injection. The third injection site was 1 fingerbreadth superiorly and medially from the first injection site.  -Cervical paraspinal muscle injection, 2 sites, bilateral knee first injection site was 1 cm from the midline of the cervical spine, 3 cm inferior to the lower border of the occipital protuberance. The second injection site was 1.5 cm superiorly and laterally to the first injection site.  -Trapezius muscle injection was performed at 3 sites, bilaterally. The first injection site was in the upper trapezius muscle halfway between the inflection point of the neck, and the acromion. The second injection site was one half way between the acromion and the first injection site. The third injection was done between the first injection site and the inflection point of the neck.   Will return for repeat injection in 3 months.   200 units of Botox was  used, any Botox not injected was wasted. The patient tolerated the procedure well, there were no complications of the above procedure.

## 2020-07-06 NOTE — Telephone Encounter (Signed)
LVM seeing if pt could come earlier for appointment today, please offer 12 or 1 pm if she calls back.

## 2020-07-06 NOTE — Progress Notes (Signed)
Botox- 200 units x 1 vial Lot: [id number] Expiration: 02/2023 NDC: 3710-6269-48  Bacteriostatic 0.9% Sodium Chloride- 27mL total Lot: [id number] Expiration: 07/26/2021 NDC: 3500-9381-82  Dx: G43.711 S/P   Toradol 60 mg IM administeredper v.o. Dr. Clementeen Graham of R buttock. Pt tolerated well. Bandaid applied. See MAR.

## 2020-07-15 IMAGING — DX DG CHEST 1V PORT
1 series · 1 of 1 positions shown · non-contrast
Comparison: 9861

CLINICAL DATA: COVID positive, cough

EXAM:
PORTABLE CHEST 1 VIEW

[chest ap]
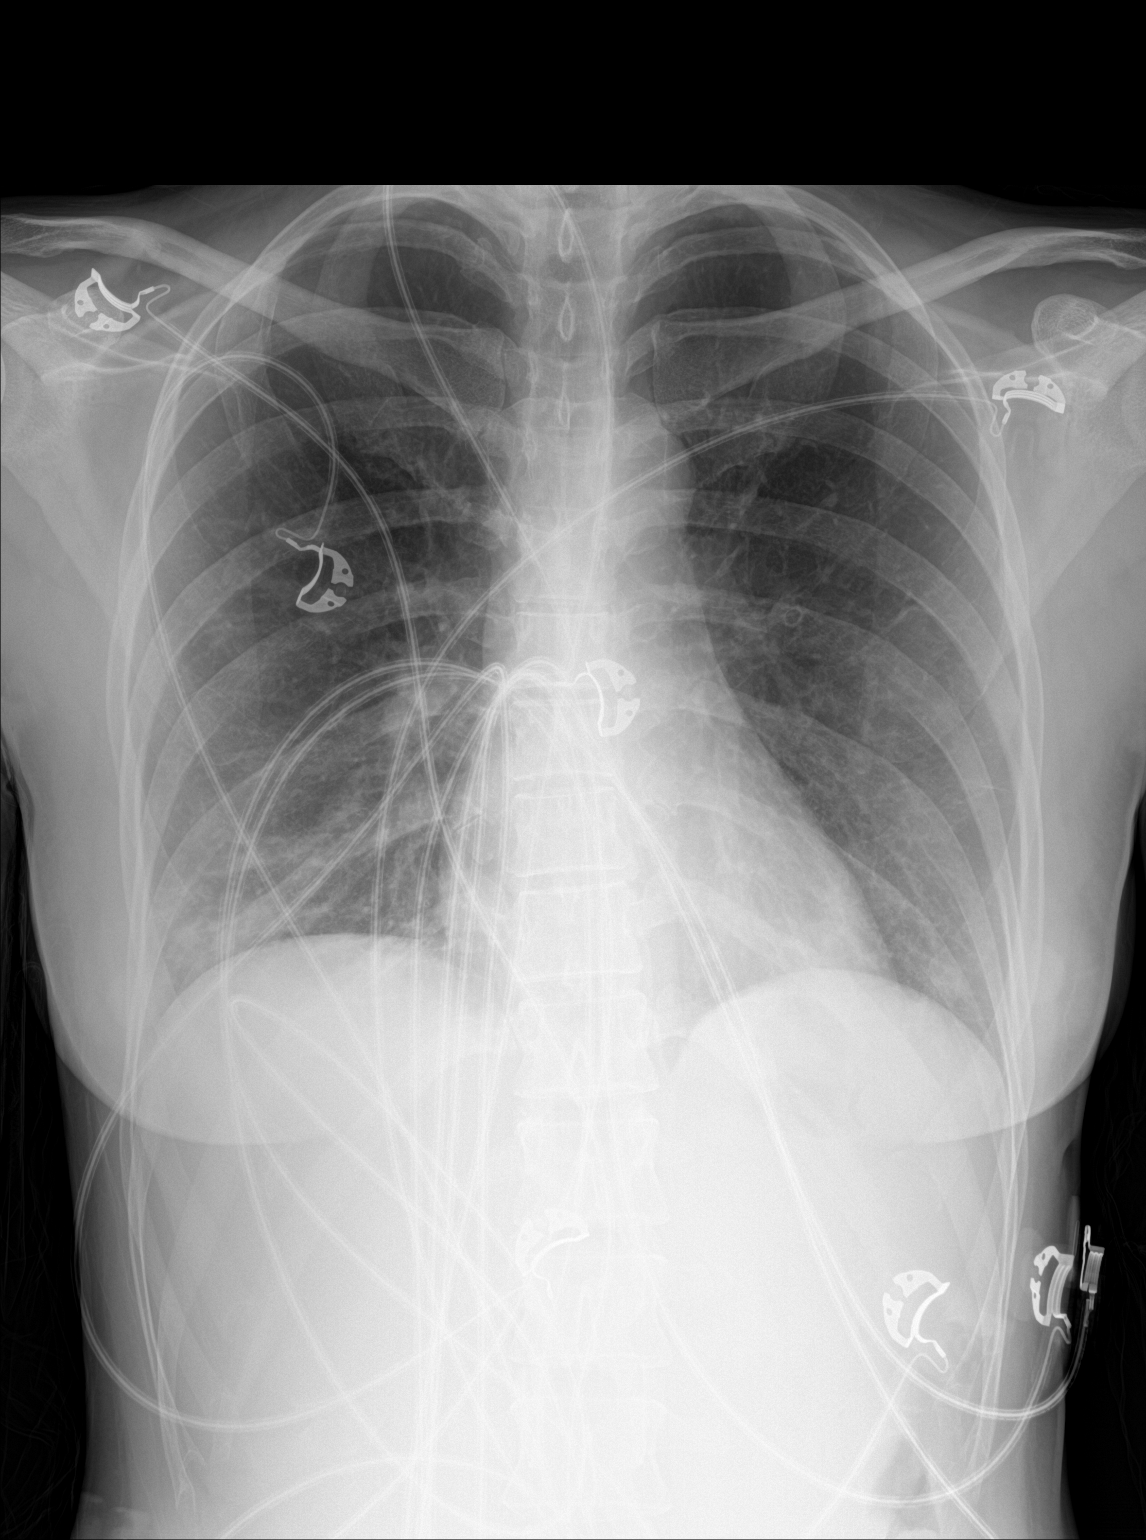

[1 of 1 positions shown; findings below may reference images not displayed]

FINDINGS: Increased patchy density at the right greater than left lung bases.
No pleural effusion or pneumothorax. Cardiomediastinal silhouette is
within normal limits.
IMPRESSION: Increased patchy atelectasis/consolidation at the right greater than
left lung bases.

## 2020-07-28 DIAGNOSIS — K900 Celiac disease: Secondary | ICD-10-CM | POA: Diagnosis not present

## 2020-07-28 DIAGNOSIS — K5904 Chronic idiopathic constipation: Secondary | ICD-10-CM | POA: Diagnosis not present

## 2020-08-09 NOTE — Progress Notes (Deleted)
  [name] [address] [place] [address] Phone: [phone] Subjective:    I'm seeing this patient by the request  of:  Farris Has, MD  CC:   [id number]  [name] is a 45 y.o. female coming in with complaint of back and neck pain. OMT 07/05/2020. Patient states   Medications patient has been prescribed: Gabapentin Taking:         Reviewed prior external information including notes and imaging from previsou exam, outside providers and external EMR if available.   As well as notes that were available from care everywhere and other healthcare systems.  Past medical history, social, surgical and family history all reviewed in electronic medical record.  No pertanent information unless stated regarding to the chief complaint.   Past Medical History:  Diagnosis Date  . Anxiety   . Celiac sprue   . Headache    migraines  . TMJ arthralgia     Allergies  Allergen Reactions  . Topamax [Topiramate] Other (See Comments)    Couldn't function     Review of Systems:  No headache, visual changes, nausea, vomiting, diarrhea, constipation, dizziness, abdominal pain, skin rash, fevers, chills, night sweats, weight loss, swollen lymph nodes, body aches, joint swelling, chest pain, shortness of breath, mood changes. POSITIVE muscle aches  Objective  There were no vitals taken for this visit.   General: No apparent distress alert and oriented x3 mood and affect normal, dressed appropriately.  HEENT: Pupils equal, extraocular movements intact  Respiratory: Patient's speak in full sentences and does not appear short of breath  Cardiovascular: No lower extremity edema, non tender, no erythema  Neuro: Cranial nerves II through XII are intact, neurovascularly intact in all extremities with 2+ DTRs and 2+ pulses.  Gait normal with good balance and coordination.  MSK:  Non tender with full range of motion and good stability and symmetric  strength and tone of shoulders, elbows, wrist, hip, knee and ankles bilaterally.  Back - Normal skin, Spine with normal alignment and no deformity.  No tenderness to vertebral process palpation.  Paraspinous muscles are not tender and without spasm.   Range of motion is full at neck and lumbar sacral regions  Osteopathic findings  C2 flexed rotated and side bent right C6 flexed rotated and side bent left T3 extended rotated and side bent right inhaled rib T9 extended rotated and side bent left L2 flexed rotated and side bent right Sacrum right on right       Assessment and Plan:    Nonallopathic problems  Decision today to treat with OMT was based on Physical Exam  After verbal consent patient was treated with HVLA, ME, FPR techniques in cervical, rib, thoracic, lumbar, and sacral  areas  Patient tolerated the procedure well with improvement in symptoms  Patient given exercises, stretches and lifestyle modifications  See medications in patient instructions if given  Patient will follow up in 4-8 weeks      The above documentation has been reviewed and is accurate and complete Wilford Grist       Note: This dictation was prepared with Dragon dictation along with smaller phrase technology. Any transcriptional errors that result from this process are unintentional.

## 2020-08-10 ENCOUNTER — Ambulatory Visit: Payer: BC Managed Care – PPO | Admitting: Family Medicine

## 2020-09-09 NOTE — Progress Notes (Signed)
[name] [address] [place] [address] Phone: [phone] Subjective:   [name], am serving as a scribe for Dr. Antoine Primas. This visit occurred during the SARS-CoV-2 public health emergency.  Safety protocols were in place, including screening questions prior to the visit, additional usage of staff PPE, and extensive cleaning of exam room while observing appropriate contact time as indicated for disinfecting solutions.   I'm seeing this patient by the request  of:  Farris Has, MD  CC: Neck pain and headache follow-up  [id number]  Alyona P Bardales is a 45 y.o. female coming in with complaint of back and neck pain. OMT 07/05/2020. Patient states that she was doing ok but yesterday woke up with spasm in L trap. Using Advil for pain relief as well as gabapentin.  Patient states it is just been tired recently.  Does not remember exactly what she was done.  States that the pain is fairly severe.  Denies any radiation down the arm.  Seems to be just staying more in the neck in the trapezius area.  Medications patient has been prescribed: Gabapentin  Taking: Yes         Reviewed prior external information including notes and imaging from previsou exam, outside providers and external EMR if available.   As well as notes that were available from care everywhere and other healthcare systems.  Past medical history, social, surgical and family history all reviewed in electronic medical record.  No pertanent information unless stated regarding to the chief complaint.   Past Medical History:  Diagnosis Date  . Anxiety   . Celiac sprue   . Headache    migraines  . TMJ arthralgia     Allergies  Allergen Reactions  . Topamax [Topiramate] Other (See Comments)    Couldn't function     Review of Systems:  No  visual changes, nausea, vomiting, diarrhea, constipation, dizziness, abdominal pain, skin rash, fevers, chills, night sweats,  weight loss, swollen lymph nodes, joint swelling, chest pain, shortness of breath, mood changes. POSITIVE muscle aches, body aches, headache  Objective  Blood pressure 104/72, pulse (!) 105, height 5\' 4"  (1.626 m), weight 122 lb (55.3 kg), SpO2 99 %.   General: No apparent distress alert and oriented x3 mood and affect normal, dressed appropriately.  HEENT: Pupils equal, extraocular movements intact  Respiratory: Patient's speak in full sentences and does not appear short of breath  Cardiovascular: No lower extremity edema, non tender, no erythema  Gait normal with good balance and coordination.  MSK:  Non tender with full range of motion and good stability and symmetric strength and tone of shoulders, elbows, wrist, hip, knee and ankles bilaterally.  Back - Normal skin, Spine with normal alignment and no deformity.  No tenderness to vertebral process palpation.  Paraspinous muscles are not tender and without spasm.   Range of motion is full at neck and lumbar sacral regions  Osteopathic findings  C2 flexed rotated and side bent right C6 flexed rotated and side bent left T3 extended rotated and side bent left inhaled rib        Assessment and Plan:  Cervicalgia Patient does have significant discomfort noted.  Patient did respond very well to osteopathic manipulation.  Patient given Toradol and Depo-Medrol secondary to the increase in discomfort.  Patient can continue the gabapentin.  Patient has had side effects to the Flexeril so we will hold on anything else at this time I am not  41 patient given topical anti-inflammatories.  Discussed icing regimen.  If this continues to give her trouble I would consider the possibility of repeating MRI of the cervical spine as well.  Left shoulder MRI.  Do not think that this will be necessary and follow-up again in 3 to 4 weeks    Nonallopathic problems  Decision today to treat with OMT was based on Physical Exam  After verbal consent patient  was treated with HVLA, ME, FPR techniques in cervical, rib, thoracic areas  Patient tolerated the procedure well with improvement in symptoms  Patient given exercises, stretches and lifestyle modifications  See medications in patient instructions if given  Patient will follow up in 4-8 weeks      The above documentation has been reviewed and is accurate and complete Judi Saa, DO       Note: This dictation was prepared with Dragon dictation along with smaller phrase technology. Any transcriptional errors that result from this process are unintentional.

## 2020-09-12 ENCOUNTER — Ambulatory Visit (INDEPENDENT_AMBULATORY_CARE_PROVIDER_SITE_OTHER): Payer: BC Managed Care – PPO | Admitting: Family Medicine

## 2020-09-12 ENCOUNTER — Encounter: Payer: Self-pay | Admitting: Family Medicine

## 2020-09-12 ENCOUNTER — Other Ambulatory Visit: Payer: Self-pay

## 2020-09-12 VITALS — BP 104/72 | HR 105 | Ht 64.0 in | Wt 122.0 lb

## 2020-09-12 DIAGNOSIS — M999 Biomechanical lesion, unspecified: Secondary | ICD-10-CM | POA: Diagnosis not present

## 2020-09-12 DIAGNOSIS — M542 Cervicalgia: Secondary | ICD-10-CM

## 2020-09-12 MED ORDER — KETOROLAC TROMETHAMINE 30 MG/ML IJ SOLN
30.0000 mg | Freq: Once | INTRAMUSCULAR | Status: AC
  Administered 2020-09-12: 30 mg via INTRAMUSCULAR

## 2020-09-12 MED ORDER — METHYLPREDNISOLONE ACETATE 40 MG/ML IJ SUSP
40.0000 mg | Freq: Once | INTRAMUSCULAR | Status: AC
  Administered 2020-09-12: 40 mg via INTRAMUSCULAR

## 2020-09-12 NOTE — Assessment & Plan Note (Signed)
Patient does have significant discomfort noted.  Patient did respond very well to osteopathic manipulation.  Patient given Toradol and Depo-Medrol secondary to the increase in discomfort.  Patient can continue the gabapentin.  Patient has had side effects to the Flexeril so we will hold on anything else at this time I am not 41 patient given topical anti-inflammatories.  Discussed icing regimen.  If this continues to give her trouble I would consider the possibility of repeating MRI of the cervical spine as well.  Left shoulder MRI.  Do not think that this will be necessary and follow-up again in 3 to 4 weeks

## 2020-09-12 NOTE — Patient Instructions (Signed)
Good to see you Ice 20 mins 2 times a day Pennsaid 2 times a day Continue to work on exercises See me again in 3-4 weeks

## 2020-10-05 ENCOUNTER — Telehealth: Payer: Self-pay | Admitting: Neurology

## 2020-10-05 NOTE — Telephone Encounter (Signed)
Patient's next Botox appointment is 4/20. I called [facility] to check status of Botox order/schedule delivery. I spoke with Dorene Sorrow who states Botox TBD 4/19.

## 2020-10-06 ENCOUNTER — Ambulatory Visit: Payer: Self-pay | Admitting: Neurology

## 2020-10-11 DIAGNOSIS — G43711 Chronic migraine without aura, intractable, with status migrainosus: Secondary | ICD-10-CM | POA: Diagnosis not present

## 2020-10-11 NOTE — Progress Notes (Deleted)
  [name] [address] [place] [address] Phone: [phone] Subjective:    I'm seeing this patient by the request  of:  Farris Has, MD  CC:   [id number]  [name] is a 45 y.o. female coming in with complaint of back and neck pain. OMT 09/12/2020. Patient states   Medications patient has been prescribed: Gabapentin  Taking:         Reviewed prior external information including notes and imaging from previsou exam, outside providers and external EMR if available.   As well as notes that were available from care everywhere and other healthcare systems.  Past medical history, social, surgical and family history all reviewed in electronic medical record.  No pertanent information unless stated regarding to the chief complaint.   Past Medical History:  Diagnosis Date  . Anxiety   . Celiac sprue   . Headache    migraines  . TMJ arthralgia     Allergies  Allergen Reactions  . Topamax [Topiramate] Other (See Comments)    Couldn't function     Review of Systems:  No headache, visual changes, nausea, vomiting, diarrhea, constipation, dizziness, abdominal pain, skin rash, fevers, chills, night sweats, weight loss, swollen lymph nodes, body aches, joint swelling, chest pain, shortness of breath, mood changes. POSITIVE muscle aches  Objective  There were no vitals taken for this visit.   General: No apparent distress alert and oriented x3 mood and affect normal, dressed appropriately.  HEENT: Pupils equal, extraocular movements intact  Respiratory: Patient's speak in full sentences and does not appear short of breath  Cardiovascular: No lower extremity edema, non tender, no erythema  Neuro: Cranial nerves II through XII are intact, neurovascularly intact in all extremities with 2+ DTRs and 2+ pulses.  Gait normal with good balance and coordination.  MSK:  Non tender with full range of motion and good stability and  symmetric strength and tone of shoulders, elbows, wrist, hip, knee and ankles bilaterally.  Back - Normal skin, Spine with normal alignment and no deformity.  No tenderness to vertebral process palpation.  Paraspinous muscles are not tender and without spasm.   Range of motion is full at neck and lumbar sacral regions  Osteopathic findings  C2 flexed rotated and side bent right C6 flexed rotated and side bent left T3 extended rotated and side bent right inhaled rib T9 extended rotated and side bent left L2 flexed rotated and side bent right Sacrum right on right       Assessment and Plan:    Nonallopathic problems  Decision today to treat with OMT was based on Physical Exam  After verbal consent patient was treated with HVLA, ME, FPR techniques in cervical, rib, thoracic, lumbar, and sacral  areas  Patient tolerated the procedure well with improvement in symptoms  Patient given exercises, stretches and lifestyle modifications  See medications in patient instructions if given  Patient will follow up in 4-8 weeks      The above documentation has been reviewed and is accurate and complete Wilford Grist       Note: This dictation was prepared with Dragon dictation along with smaller phrase technology. Any transcriptional errors that result from this process are unintentional.

## 2020-10-11 NOTE — Telephone Encounter (Signed)
Received (1) 200 unit vial of Botox today from [facility]. 

## 2020-10-12 ENCOUNTER — Ambulatory Visit: Payer: BC Managed Care – PPO | Admitting: Family Medicine

## 2020-10-12 ENCOUNTER — Ambulatory Visit (INDEPENDENT_AMBULATORY_CARE_PROVIDER_SITE_OTHER): Payer: BC Managed Care – PPO | Admitting: Neurology

## 2020-10-12 DIAGNOSIS — G43711 Chronic migraine without aura, intractable, with status migrainosus: Secondary | ICD-10-CM | POA: Diagnosis not present

## 2020-10-12 DIAGNOSIS — G243 Spasmodic torticollis: Secondary | ICD-10-CM

## 2020-10-12 MED ORDER — KETOROLAC TROMETHAMINE 60 MG/2ML IM SOLN
60.0000 mg | Freq: Once | INTRAMUSCULAR | Status: AC
  Administered 2020-10-12: 60 mg via INTRAMUSCULAR

## 2020-10-12 NOTE — Progress Notes (Signed)
Botox- 200 units x 1 vial Lot: [id number] Expiration: 05/2023 NDC: 1552-0802-23  Bacteriostatic 0.9% Sodium Chloride- 65mL total Lot: [id number] Expiration: 07/26/2021 NDC: 4975-3005-11  Dx: G43.711 S/P   Toradol 60 mg IM administeredper v.o. Dr. Clementeen Graham of [place] [facility]. Pt tolerated well. Bandaid applied. See MAR.

## 2020-10-12 NOTE — Progress Notes (Signed)
Consent Form  10/12/2020 Botulism Toxin Injection For Chronic Migraine +a. 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 07/06/2020: Still excellent > 70% improvement in migraine frequency.   04/05/2020: Still excellent > 70% improvemen tin migraine frequency.   Interval history 12/23/2019: Continues excellent response. >70% improvement only 1 mild migraine a month. She saw zach smith at [facility] and feels great and is also having dry needling again.   Interval history 06/04/2019: Continues excellent response, flexeril really helping. Out of 30 days, she has had 1 migraines which is a drastic improvement. >95% decrease in frequency. She has neck pain ordered dry needling which helped. +a. Doing great on the Gralise.  Bernita Raisin did not work. Has only needed to try Nurtec once.      Consent Form Botulism Toxin Injection For Chronic Migraine    Reviewed orally with patient, additionally signature is on file:  Botulism toxin has been approved by the Federal drug administration for treatment of chronic migraine. Botulism toxin does not cure chronic migraine and it may not be effective in some patients.  The administration of botulism toxin is accomplished by injecting a small amount of toxin into the muscles of the neck and head. Dosage must be titrated for each individual. Any benefits resulting from botulism toxin tend to wear off after 3 months with a repeat injection required if benefit is to be maintained. Injections are usually done every 3-4 months with maximum effect peak achieved by about 2 or 3 weeks. Botulism toxin is expensive and you should be sure of what costs you will incur resulting from the injection.  The side effects of botulism  toxin use for chronic migraine may include:   -Transient, and usually mild, facial weakness with facial injections  -Transient, and usually mild, head or neck weakness with head/neck injections  -Reduction or loss of forehead facial animation due to forehead muscle weakness  -Eyelid drooping  -Dry eye  -Pain at the site of injection or bruising at the site of injection  -Double vision  -Potential unknown long term risks  Contraindications: You should not have Botox if you are pregnant, nursing, allergic to albumin, have an infection, skin condition, or muscle weakness at the site of the injection, or have myasthenia gravis, Lambert-Eaton syndrome, or ALS.  It is also possible that as with any injection, there may be an allergic reaction or no effect from the medication. Reduced effectiveness after repeated injections is sometimes seen and rarely infection at the injection site may occur. All care will be taken to prevent these side effects. If therapy is given over a long time, atrophy and wasting in the muscle injected may occur. Occasionally the patient's become refractory to treatment because they develop antibodies to the toxin. In this event, therapy needs to be modified.  I have read the above information and consent to the administration of botulism toxin.    BOTOX PROCEDURE NOTE FOR MIGRAINE HEADACHE    Contraindications and precautions discussed with patient(above). Aseptic procedure was observed and patient tolerated procedure. Procedure performed by Dr. Artemio Aly  The condition has existed for more than 6 months, and pt does not have a diagnosis of ALS, Myasthenia Gravis or Lambert-Eaton Syndrome.  Risks and benefits of injections discussed and pt agrees to proceed with the  procedure.  Written consent obtained  These injections are medically necessary. Pt  receives good benefits from these injections. These injections do not cause sedations or hallucinations which the oral  therapies may cause.  Description of procedure:  The patient was placed in a sitting position. The standard protocol was used for Botox as follows, with 5 units of Botox injected at each site:   -Procerus muscle, midline injection  -Corrugator muscle, bilateral injection  -Frontalis muscle, bilateral injection, with 2 sites each side, medial injection was performed in the upper one third of the frontalis muscle, in the region vertical from the medial inferior edge of the superior orbital rim. The lateral injection was again in the upper one third of the forehead vertically above the lateral limbus of the cornea, 1.5 cm lateral to the medial injection site.  -Temporalis muscle injection, 4 sites, bilaterally. The first injection was 3 cm above the tragus of the ear, second injection site was 1.5 cm to 3 cm up from the first injection site in line with the tragus of the ear. The third injection site was 1.5-3 cm forward between the first 2 injection sites. The fourth injection site was 1.5 cm posterior to the second injection site.   -Occipitalis muscle injection, 3 sites, bilaterally. The first injection was done one half way between the occipital protuberance and the tip of the mastoid process behind the ear. The second injection site was done lateral and superior to the first, 1 fingerbreadth from the first injection. The third injection site was 1 fingerbreadth superiorly and medially from the first injection site.  -Cervical paraspinal muscle injection, 2 sites, bilateral knee first injection site was 1 cm from the midline of the cervical spine, 3 cm inferior to the lower border of the occipital protuberance. The second injection site was 1.5 cm superiorly and laterally to the first injection site.  -Trapezius muscle injection was performed at 3 sites, bilaterally. The first injection site was in the upper trapezius muscle halfway between the inflection point of the neck, and the acromion. The  second injection site was one half way between the acromion and the first injection site. The third injection was done between the first injection site and the inflection point of the neck.   Will return for repeat injection in 3 months.   200 units of Botox was used, any Botox not injected was wasted. The patient tolerated the procedure well, there were no complications of the above procedure.

## 2020-10-21 DIAGNOSIS — H33301 Unspecified retinal break, right eye: Secondary | ICD-10-CM | POA: Diagnosis not present

## 2020-10-21 DIAGNOSIS — H43811 Vitreous degeneration, right eye: Secondary | ICD-10-CM | POA: Diagnosis not present

## 2020-10-28 DIAGNOSIS — F411 Generalized anxiety disorder: Secondary | ICD-10-CM | POA: Diagnosis not present

## 2020-11-08 DIAGNOSIS — H43813 Vitreous degeneration, bilateral: Secondary | ICD-10-CM | POA: Diagnosis not present

## 2020-11-08 DIAGNOSIS — H33303 Unspecified retinal break, bilateral: Secondary | ICD-10-CM | POA: Diagnosis not present

## 2020-11-17 DIAGNOSIS — H43813 Vitreous degeneration, bilateral: Secondary | ICD-10-CM | POA: Diagnosis not present

## 2020-11-17 DIAGNOSIS — H5213 Myopia, bilateral: Secondary | ICD-10-CM | POA: Diagnosis not present

## 2020-11-21 ENCOUNTER — Other Ambulatory Visit: Payer: Self-pay | Admitting: Neurology

## 2020-12-18 DIAGNOSIS — N3000 Acute cystitis without hematuria: Secondary | ICD-10-CM | POA: Diagnosis not present

## 2020-12-22 ENCOUNTER — Other Ambulatory Visit: Payer: Self-pay | Admitting: Family Medicine

## 2020-12-29 NOTE — Progress Notes (Signed)
I, Christoper Fabian, LAT, ATC, am serving as scribe for Dr. Clementeen Graham.  [name] is a 45 y.o. female who presents to [facility] today for B shoulder/upper trap pain.  She was last seen by Dr. Katrinka Blazing on 09/12/20 for neck pain and OMT.  Pt reports B shoulder pain x several years .  She locates her pain to her B upper traps that radiates down into her T-spine .  Radiating pain: yes into her upper T-spine and scapula Aggravating factors: pain is worse in the morning; UE weight-bearing; prolonged sitting w/ computer work Treatments tried: OMT w/ Dr. Katrinka Blazing; all varieties of topical pain-relieving gels/creams; injections; heat; massage Aleve; cupping; chiropractor  Diagnostic imaging: C-spine MRI- 02/12/18; C-spine XR- 02/06/18  Pertinent review of systems: No fevers or chills  Relevant historical information: History of migraine headache and cervicogenic headache managed with neurology with Botox injections.   Exam:  BP 100/68 (BP Location: Left Arm, Patient Position: Sitting, Cuff Size: Normal)   Pulse 83   Ht 5\' 4"  (1.626 m)   Wt 118 lb 9.6 oz (53.8 kg)   SpO2 99%   BMI 20.36 kg/m  General: Well Developed, well nourished, and in no acute distress.   MSK: C-spine normal-appearing Nontender midline. Tender palpation bilateral trapezius with palpable muscle spasms.  Tender palpation left cervical paraspinal musculature. Decreased cervical motion. Upper extremity strength reflexes and sensation are intact and equal bilaterally.    Lab and Radiology Results   X-ray images cervical spine obtained today personally and independently interpreted Mild DDD C5-6.  Loss of cervical lordosis.  No fractures or severe malalignment. Await formal radiology review  EXAM: MRI CERVICAL SPINE WITHOUT CONTRAST   TECHNIQUE: Multiplanar, multisequence MR imaging of the cervical spine was performed. No intravenous contrast was administered.   COMPARISON:   Radiographs dated 02/06/2018   FINDINGS: Alignment: Physiologic.   Vertebrae: No fracture, evidence of discitis, or bone lesion.   Cord: Normal signal and morphology.   Posterior Fossa, vertebral arteries, paraspinal tissues: Negative.   Disc levels:   C2-3: Normal.   C3-4: Normal.   C4-5: Normal.   C5-6: Small broad-based soft disc protrusion and slight disc space narrowing with minimal encroachment upon the lateral recesses on the right and left. No foraminal stenosis.   C6-7: Normal.   C7-T1: Normal.   There is no spinal or foraminal stenosis or facet arthritis in the cervical spine.   IMPRESSION: 1. Degenerative disc disease at C5-6 with slight disc space narrowing and a small broad-based disc protrusion slightly encroaching upon both lateral recesses, best demonstrated on image 15 of series 6. 2. Otherwise, essentially normal MRI of the cervical spine.     Electronically Signed   By: 02/08/2018 M.D.   On: 02/12/2018 10:06 I, 02/14/2018, personally (independently) visualized and performed the interpretation of the images attached in this note.      Assessment and Plan: 45 y.o. female with pain in bilateral trapezius and left cervical paraspinal musculature due to muscle spasm and dysfunction with possible facet generated cervical pain or discogenic pain.  This is a chronic ongoing problem that has had multiple different interventions over the years but most recently over the last 6 months has had multiple different OMT interventions and multiple different interventions with neurology including Botox injections.  She additionally has been doing physician directed conservative management home exercises all with mediocre to moderate benefit.  She still hurts every day and is discouraged  by her pain.  At this point plan to proceed to MRI to further characterize cause of pain.  Last MRI was over 3 years ago and certainly things could have worsened since then.  MRI  would be to determine cause of pain and to modify potential treatment options including potential facet injections or epidural steroid injection.  At this point we will also refer back to PT for trial of dry needling as she has had some benefit in the remote past with that.  Lastly in clinic today provide Depo-Medrol and Toradol injection IM.   PDMP not reviewed this encounter. Orders Placed This Encounter  Procedures   DG Cervical Spine 2 or 3 views    Standing Status:   Future    Number of Occurrences:   1    Standing Expiration Date:   12/30/2021    Order Specific Question:   Reason for Exam (SYMPTOM  OR DIAGNOSIS REQUIRED)    Answer:   eval BL trap and left neck pain    Order Specific Question:   Is patient pregnant?    Answer:   No    Order Specific Question:   Preferred imaging location?    Answer:   [facility]   MR CERVICAL SPINE WO CONTRAST    Standing Status:   Future    Standing Expiration Date:   12/30/2021    Order Specific Question:   What is the patient's sedation requirement?    Answer:   No Sedation    Order Specific Question:   Does the patient have a pacemaker or implanted devices?    Answer:   No    Order Specific Question:   Preferred imaging location?    Answer:   [facility] (table limit-350lbs)   Ambulatory referral to Physical Therapy    Referral Priority:   Routine    Referral Type:   Physical Medicine    Referral Reason:   Specialty Services Required    Requested Specialty:   Physical Therapy    Number of Visits Requested:   1   Meds ordered this encounter  Medications   ketorolac (TORADOL) 30 MG/ML injection 30 mg  Depo-Medrol 40 mg given IM   Discussed warning signs or symptoms. Please see discharge instructions. Patient expresses understanding.   The above documentation has been reviewed and is accurate and complete Clementeen Graham, M.D.

## 2020-12-30 ENCOUNTER — Ambulatory Visit (INDEPENDENT_AMBULATORY_CARE_PROVIDER_SITE_OTHER): Payer: BC Managed Care – PPO | Admitting: Family Medicine

## 2020-12-30 ENCOUNTER — Ambulatory Visit (INDEPENDENT_AMBULATORY_CARE_PROVIDER_SITE_OTHER): Payer: BC Managed Care – PPO

## 2020-12-30 ENCOUNTER — Other Ambulatory Visit: Payer: Self-pay

## 2020-12-30 ENCOUNTER — Encounter: Payer: Self-pay | Admitting: Family Medicine

## 2020-12-30 ENCOUNTER — Ambulatory Visit: Payer: Self-pay

## 2020-12-30 VITALS — BP 100/68 | HR 83 | Ht 64.0 in | Wt 118.6 lb

## 2020-12-30 DIAGNOSIS — M542 Cervicalgia: Secondary | ICD-10-CM | POA: Diagnosis not present

## 2020-12-30 DIAGNOSIS — M629 Disorder of muscle, unspecified: Secondary | ICD-10-CM

## 2020-12-30 MED ORDER — KETOROLAC TROMETHAMINE 30 MG/ML IJ SOLN
30.0000 mg | Freq: Once | INTRAMUSCULAR | Status: AC
  Administered 2020-12-30: 30 mg via INTRAMUSCULAR

## 2020-12-30 NOTE — Patient Instructions (Addendum)
Thank you for coming in today.   Please get an Xray today before you leave   I've referred you to Physical Therapy.  Let us know if you don't hear from them in one week.   You should hear from MRI scheduling within 1 week. If you do not hear please let me know.    Recheck in about 6 weeks with myself or Dr Katrinka Blazing.  We will go over the MRI and debrief PT results.   If I get really surprised  by the MRI we may be doing neck injections sooner.   We have hired a 3rd doctor who also does the OMT.

## 2021-01-02 NOTE — Progress Notes (Signed)
Mild arthritis present in Cspine at C5-6.

## 2021-01-07 ENCOUNTER — Ambulatory Visit (INDEPENDENT_AMBULATORY_CARE_PROVIDER_SITE_OTHER): Payer: BC Managed Care – PPO

## 2021-01-07 ENCOUNTER — Other Ambulatory Visit: Payer: Self-pay

## 2021-01-07 DIAGNOSIS — M542 Cervicalgia: Secondary | ICD-10-CM | POA: Diagnosis not present

## 2021-01-07 DIAGNOSIS — G8929 Other chronic pain: Secondary | ICD-10-CM

## 2021-01-07 DIAGNOSIS — M629 Disorder of muscle, unspecified: Secondary | ICD-10-CM

## 2021-01-09 NOTE — Progress Notes (Signed)
MRI c-spine shows some degenerative changes at C5-6 with possible pinched nerve at C6. You dont have much pain going down the left arm to the thumb so I do not think this is a significant problem. Plan for PT and follow up with Dr Katrinka Blazing as scheduled on 8/8

## 2021-01-09 NOTE — Telephone Encounter (Signed)
I called [facility] to see if Botox order could be scheduled for delivery. I spoke with Ruthi, who stated that patient needs to give consent. I called patient and LVM, and I also sent her a MyChart message asking her to call the pharmacy to give consent for Botox shipment by 7/20.

## 2021-01-10 DIAGNOSIS — G43711 Chronic migraine without aura, intractable, with status migrainosus: Secondary | ICD-10-CM | POA: Diagnosis not present

## 2021-01-11 ENCOUNTER — Ambulatory Visit (INDEPENDENT_AMBULATORY_CARE_PROVIDER_SITE_OTHER): Payer: BC Managed Care – PPO | Admitting: Neurology

## 2021-01-11 VITALS — BP 114/76 | HR 69

## 2021-01-11 DIAGNOSIS — G43711 Chronic migraine without aura, intractable, with status migrainosus: Secondary | ICD-10-CM | POA: Diagnosis not present

## 2021-01-11 MED ORDER — KETOROLAC TROMETHAMINE 60 MG/2ML IM SOLN
60.0000 mg | Freq: Once | INTRAMUSCULAR | Status: AC
  Administered 2021-01-11: 60 mg via INTRAMUSCULAR

## 2021-01-11 NOTE — Progress Notes (Signed)
Consent Form 01/11/2021; She is stable. She has not reached back out to Dr. Kirtland Bouchard yet since she was busy, her oldest is going to app state, she has a 44 year old daughter as well 10/12/2020 Botulism Toxin Injection For Chronic Migraine +a. 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 07/06/2020: Still excellent > 70% improvement in migraine frequency.   04/05/2020: Still excellent > 70% improvemen tin migraine frequency.   Interval history 12/23/2019: Continues excellent response. >70% improvement only 1 mild migraine a month. She saw zach smith at [facility] and feels great and is also having dry needling again.   Interval history 06/04/2019: Continues excellent response, flexeril really helping. Out of 30 days, she has had 1 migraines which is a drastic improvement. >95% decrease in frequency. She has neck pain ordered dry needling which helped. +a. Doing great on the Gralise.  Bernita Raisin did not work. Has only needed to try Nurtec once.      Consent Form Botulism Toxin Injection For Chronic Migraine    Reviewed orally with patient, additionally signature is on file:  Botulism toxin has been approved by the Federal drug administration for treatment of chronic migraine. Botulism toxin does not cure chronic migraine and it may not be effective in some patients.  The administration of botulism toxin is accomplished by injecting a small amount of toxin into the muscles of the neck and head. Dosage must be titrated for each individual. Any benefits resulting from botulism toxin tend to wear off after 3 months with a repeat injection required if benefit is to be maintained. Injections are usually done every 3-4 months with maximum effect peak achieved by about  2 or 3 weeks. Botulism toxin is expensive and you should be sure of what costs you will incur resulting from the injection.  The side effects of botulism toxin use for chronic migraine may include:   -Transient, and usually mild, facial weakness with facial injections  -Transient, and usually mild, head or neck weakness with head/neck injections  -Reduction or loss of forehead facial animation due to forehead muscle weakness  -Eyelid drooping  -Dry eye  -Pain at the site of injection or bruising at the site of injection  -Double vision  -Potential unknown long term risks  Contraindications: You should not have Botox if you are pregnant, nursing, allergic to albumin, have an infection, skin condition, or muscle weakness at the site of the injection, or have myasthenia gravis, Lambert-Eaton syndrome, or ALS.  It is also possible that as with any injection, there may be an allergic reaction or no effect from the medication. Reduced effectiveness after repeated injections is sometimes seen and rarely infection at the injection site may occur. All care will be taken to prevent these side effects. If therapy is given over a long time, atrophy and wasting in the muscle injected may occur. Occasionally the patient's become refractory to treatment because they develop antibodies to the toxin. In this event, therapy needs to be modified.  I have read the above information and consent to the administration of botulism toxin.    BOTOX PROCEDURE NOTE FOR MIGRAINE HEADACHE    Contraindications and precautions discussed with patient(above). Aseptic procedure was observed and patient tolerated procedure. Procedure performed by Dr. Artemio Aly  The condition has existed  for more than 6 months, and pt does not have a diagnosis of ALS, Myasthenia Gravis or Lambert-Eaton Syndrome.  Risks and benefits of injections discussed and pt agrees to proceed with the procedure.  Written consent obtained  These  injections are medically necessary. Pt  receives good benefits from these injections. These injections do not cause sedations or hallucinations which the oral therapies may cause.  Description of procedure:  The patient was placed in a sitting position. The standard protocol was used for Botox as follows, with 5 units of Botox injected at each site:   -Procerus muscle, midline injection  -Corrugator muscle, bilateral injection  -Frontalis muscle, bilateral injection, with 2 sites each side, medial injection was performed in the upper one third of the frontalis muscle, in the region vertical from the medial inferior edge of the superior orbital rim. The lateral injection was again in the upper one third of the forehead vertically above the lateral limbus of the cornea, 1.5 cm lateral to the medial injection site.  -Temporalis muscle injection, 4 sites, bilaterally. The first injection was 3 cm above the tragus of the ear, second injection site was 1.5 cm to 3 cm up from the first injection site in line with the tragus of the ear. The third injection site was 1.5-3 cm forward between the first 2 injection sites. The fourth injection site was 1.5 cm posterior to the second injection site.   -Occipitalis muscle injection, 3 sites, bilaterally. The first injection was done one half way between the occipital protuberance and the tip of the mastoid process behind the ear. The second injection site was done lateral and superior to the first, 1 fingerbreadth from the first injection. The third injection site was 1 fingerbreadth superiorly and medially from the first injection site.  -Cervical paraspinal muscle injection, 2 sites, bilateral knee first injection site was 1 cm from the midline of the cervical spine, 3 cm inferior to the lower border of the occipital protuberance. The second injection site was 1.5 cm superiorly and laterally to the first injection site.  -Trapezius muscle injection was  performed at 3 sites, bilaterally. The first injection site was in the upper trapezius muscle halfway between the inflection point of the neck, and the acromion. The second injection site was one half way between the acromion and the first injection site. The third injection was done between the first injection site and the inflection point of the neck.   Will return for repeat injection in 3 months.   200 units of Botox was used, any Botox not injected was wasted. The patient tolerated the procedure well, there were no complications of the above procedure.

## 2021-01-11 NOTE — Progress Notes (Signed)
Botox- 200 units x 1 vial Lot: [id number] Expiration: 06/2023 NDC: 4734-0370-96  Bacteriostatic 0.9% Sodium Chloride- 58mL total [id number] Expiration: 06/2022 NDC: 3754-3606-77  Dx: G43.711 SP

## 2021-01-11 NOTE — Progress Notes (Signed)
Pt here for BOTOX injection.  Order to toradol 60mg /27ml ordered premedication. Under aseptic technique Toradol 60mg  50ml IM given  R upper outer quadrant. .  Tolerated well.  Bandaid applied.

## 2021-01-13 ENCOUNTER — Ambulatory Visit (INDEPENDENT_AMBULATORY_CARE_PROVIDER_SITE_OTHER): Payer: BC Managed Care – PPO | Admitting: Rehabilitative and Restorative Service Providers"

## 2021-01-13 ENCOUNTER — Other Ambulatory Visit: Payer: Self-pay

## 2021-01-13 ENCOUNTER — Encounter: Payer: Self-pay | Admitting: Rehabilitative and Restorative Service Providers"

## 2021-01-13 DIAGNOSIS — R293 Abnormal posture: Secondary | ICD-10-CM

## 2021-01-13 DIAGNOSIS — M542 Cervicalgia: Secondary | ICD-10-CM | POA: Diagnosis not present

## 2021-01-13 NOTE — Patient Instructions (Signed)
Access Code: [email or web address] URL: [email or web address] Date: 01/13/2021 Prepared by: Chyrel Masson  Exercises Seated Upper Trapezius Stretch - 2 x daily - 7 x weekly - 1 sets - 5 reps - 15 hold Prone Scapular Retraction - 2 x daily - 7 x weekly - 1 sets - 10 reps - 5 hold Prone Scapular Slide with Shoulder Extension - 2 x daily - 7 x weekly - 1 sets - 10 reps - 5 hold Shoulder External Rotation and Scapular Retraction with Resistance - 2 x daily - 7 x weekly - 3 sets - 10 reps Dry Needling handout

## 2021-01-13 NOTE — Therapy (Addendum)
[facility] [address] [place], [place], [address] Phone: [phone]   Fax:  [phone]  Physical Therapy Evaluation  Patient Details  Name: [name] MRN: [id number] Date of Birth: Nov 30, 1975 Referring Provider (PT): Dr. Clementeen Graham   Encounter Date: 01/13/2021   PT End of Session - 01/13/21 0841     Visit Number 1    Number of Visits 16    Date for PT Re-Evaluation 03/10/21    Authorization Type BCBS    Progress Note Due on Visit 10    PT Start Time [phone]   PT end time 920   Activity Tolerance Patient tolerated treatment well    Behavior During Therapy [facility] for tasks assessed/performed             Past Medical History:  Diagnosis Date   Anxiety    Celiac sprue    Headache    migraines   TMJ arthralgia     Past Surgical History:  Procedure Laterality Date   lymph node removal  2000    There were no vitals filed for this visit.    Subjective Assessment - 01/13/21 0847     Subjective Pt. indicated onset of migraines and some neck/shoulder pains with knots about 4 years ago.   Pt. indicated "trying everything" including chrio, massage, yoga, cupping, support braces, acupunction, Botox injections for headaches "every 12 weeks.  Injections do help headaches.  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  Pt. indicated waking up with pain symptoms and variability in symptoms from work activity.  Has tried various pillows to help but not sure if they help.  Pt. stated occasional waking due to symptoms but usually just painful upon waking.    Limitations Sitting    Diagnostic tests xray, mri on neck    Currently in Pain? Yes    Pain Score 6    pain at worst 8/10   Pain Location Neck    Pain Orientation Left;Right    Pain Type Chronic pain    Pain Radiating Towards denied numbness/tingling into arms    Pain Onset More than a month ago    Pain Frequency Constant    Aggravating  Factors  worse in morning, sometimes c work activity    Pain Relieving Factors injections helped headaches                [facility] PT Assessment - 01/13/21 0001       Assessment   Medical Diagnosis Cervicalgia, upper trap disorder    Referring Provider (PT) Dr. Clementeen Graham    Onset Date/Surgical Date --   Onset about 4 years ago 2018ish   Hand Dominance Right      Precautions   Precautions None      Restrictions   Weight Bearing Restrictions No      Balance Screen   Has the patient fallen in the past 6 months No    Has the patient had a decrease in activity level because of a fear of falling?  No    Is the patient reluctant to leave their home because of a fear of falling?  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[employer], paddle board (  limited at times)      Observation/Other Assessments   Focus on Therapeutic Outcomes (FOTO)  intake 63%, predicted 68%      Posture/Postural Control   Posture Comments Mild FHP noted in sitting      ROM / Strength   AROM / PROM / Strength AROM;PROM;Strength      AROM   Overall AROM Comments Tightness noted in all directions of cervical mobility    AROM Assessment Site Cervical    Cervical Flexion 45    Cervical Extension 34   pain   Cervical - Left Side Bend 74    Cervical - Right Rotation 74      Strength   Overall Strength Comments Myotomal check WFL at this time bilateral UE    Strength Assessment Site Shoulder;Elbow    Right/Left Shoulder Right;Left    Right/Left Elbow Left;Right      Palpation   Spinal mobility Very mild limitation in cPA upper and mid thoracic region    Palpation comment upper trap trigger points noted bilateral                        Objective measurements completed on examination: See above findings.        [facility] Adult PT Treatment/Exercise - 01/13/21 0001       Exercises   Exercises Neck;Other Exercises    Other Exercises  HEP instruction/performance c cues for techniques, handout provided.  Trial set performed of each for comprehension and symptom assessment.  Consisting of prone scapular retraction 5 sec hold, prone scapular retraction c GH Ext 5 sec hold, seated scapular retraction c bilateral shoulder ER, upper trap stretch 15 sec bilateral      Manual Therapy   Manual therapy comments compression to Lt and Rt upper trap, regional PA mobilization G4 c cavitation noted upon taking up slack mid thoracic region              Trigger Point Dry Needling - 01/13/21 0001     Consent Given? Yes    Education Handout Provided Yes    Muscles Treated Head and Neck Upper trapezius   Lt and Rt   Upper Trapezius Response Twitch reponse elicited                  PT Education - 01/13/21 0928     Education Details HEP, POC, DN    Person(s) Educated Patient    Methods Explanation;Demonstration;Verbal cues;Handout    Comprehension Verbalized understanding;Returned demonstration              PT Short Term Goals - 01/13/21 0841       PT SHORT TERM GOAL #1   Title Patient will demonstrate independent use of home exercise program to maintain progress from in clinic treatments.    Time 3    Period Weeks    Status New    Target Date 02/03/21               PT Long Term Goals - 01/13/21 0842       PT LONG TERM GOAL #1   Title Patient will demonstrate/report pain at worst less than or equal to 2/10 to facilitate minimal limitation in daily activity secondary to pain symptoms.    Time 8    Period Weeks    Status New    Target Date 03/10/21      PT LONG TERM GOAL #2   Title Patient will demonstrate independent  use of home exercise program to facilitate ability to maintain/progress functional gains from skilled physical therapy services.    Time 8    Period  Weeks    Status New    Target Date 03/10/21      PT LONG TERM GOAL #3   Title Pt. will demonstrate FOTO outcome > or =68% to indicated reduced disability due to condition.    Time 8    Period Weeks    Status New    Target Date 03/10/21      PT LONG TERM GOAL #4   Title Patient will demonstrate cervical AROM WFL s symptoms to facilitate daily activity including driving, self care at PLOF s limitation due to symptoms.    Time 8    Period Weeks    Status New    Target Date 03/10/21      PT LONG TERM GOAL #5   Title Pt. will demonstrate ability to perform usual workout activity at PLOF s limitation.    Time 8    Period Weeks    Status New    Target Date 03/10/21                    Plan - 01/13/21 0843     Clinical Impression Statement Patient is a 45 y.o. who comes to clinic with complaints of cervical pain with headaches with myofascial trigger points, mobility deficits that impair their ability to perform usual daily and recreational functional activities without increase difficulty/symptoms at this time.  Patient to benefit from skilled PT services to address impairments and limitations to improve to previous level of function without restriction secondary to condition.    Personal Factors and Comorbidities Time since onset of injury/illness/exacerbation    Examination-Activity Limitations Carry;Sleep;Sit;Stand;Reach Overhead;Lift    Examination-Participation Restrictions Community Activity;Occupation;Other   workouts   Stability/Clinical Decision Making Stable/Uncomplicated    Clinical Decision Making Low    Rehab Potential Good    PT Frequency 2x / week    PT Duration 8 weeks    PT Treatment/Interventions ADLs/Self Care Home Management;Cryotherapy;Electrical Stimulation;Therapeutic exercise;Moist Heat;Traction;Balance training;Therapeutic activities;Iontophoresis 4mg /ml Dexamethasone;Functional mobility training;Stair training;Gait training;DME  Instruction;Ultrasound;Neuromuscular re-education;Patient/family education;Passive range of motion;Spinal Manipulations;Joint Manipulations;Dry needling;Taping;Manual techniques    PT Next Visit Plan Dry needling, upper/mid thoracic manipulations, posterior strengthening    PT Home Exercise Plan    Consulted and Agree with Plan of Care Patient             Patient will benefit from skilled therapeutic intervention in order to improve the following deficits and impairments:  Hypomobility, Pain, Increased fascial restricitons, Decreased activity tolerance, Decreased mobility, Improper body mechanics, Impaired perceived functional ability, Impaired flexibility, Postural dysfunction, Decreased range of motion  Visit Diagnosis: Cervicalgia  Abnormal posture     Problem List Patient Active Problem List   Diagnosis Date Noted   Nonallopathic lesion of cervical region 03/31/2020   Cervicalgia 02/14/2018   Intractable chronic migraine without aura and without status migrainosus 12/17/2016   Abdominal pain, epigastric 10/05/2014   Migraine 10/20/2013   Celiac sprue 05/05/2012   13/04/2012, PT, DPT, OCS, ATC 01/13/21  10:32 AM    [facility] [address] [place], [place], [place] Phone: [phone]   Fax:  [phone]  Name: [name] MRN: Rosalie Doctor Date of Birth: Aug 11, 1975

## 2021-01-17 ENCOUNTER — Ambulatory Visit: Payer: Self-pay | Admitting: Neurology

## 2021-01-24 DIAGNOSIS — U071 COVID-19: Secondary | ICD-10-CM | POA: Diagnosis not present

## 2021-01-30 ENCOUNTER — Encounter: Payer: BC Managed Care – PPO | Admitting: Rehabilitative and Restorative Service Providers"

## 2021-01-30 ENCOUNTER — Ambulatory Visit: Payer: BC Managed Care – PPO | Admitting: Family Medicine

## 2021-02-02 NOTE — Progress Notes (Signed)
[name] [address] [place] [address] Phone: [phone] Subjective:   [name], am serving as a scribe for Dr. Antoine Primas. This visit occurred during the SARS-CoV-2 public health emergency.  Safety protocols were in place, including screening questions prior to the visit, additional usage of staff PPE, and extensive cleaning of exam room while observing appropriate contact time as indicated for disinfecting solutions.   I'm seeing this patient by the request  of:  Farris Has, MD Neck and back pain follow-up CC:   [email or web address]  [name] is a 45 y.o. female coming in with complaint of back and neck and R shoulder pain. OMT March 2022. Patient states that she has been having increased tension in both shoulders.   Medications patient has been prescribed: Gabapentin  Taking:  MRI cervical 01/07/2021 IMPRESSION: 1. Degenerative disc osteophyte at C5-6 with resultant mild canal and left C6 foraminal stenosis. 2. Minimal noncompressive disc bulging at C4-5 without stenosis.          Past Medical History:  Diagnosis Date   Anxiety    Celiac sprue    Headache    migraines   TMJ arthralgia     Allergies  Allergen Reactions   Topamax [Topiramate] Other (See Comments)    Couldn't function     Review of Systems:  No headache, visual changes, nausea, vomiting, diarrhea, constipation, dizziness, abdominal pain, skin rash, fevers, chills, night sweats, weight loss, swollen lymph nodes, body aches, joint swelling, chest pain, shortness of breath, mood changes. POSITIVE muscle aches  Objective  Blood pressure 102/74, pulse 97, height 5\' 4"  (1.626 m), weight 119 lb (54 kg), SpO2 98 %.   General: No apparent distress alert and oriented x3 mood and affect normal, dressed appropriately.  HEENT: Pupils equal, extraocular movements intact  Respiratory: Patient's speak in full sentences and does not appear short of breath   Cardiovascular: No lower extremity edema, non tender, no erythema  Neck exam does have some mild loss of lordosis.  Patient does have mild discomfort more in the thoracolumbar juncture right greater than left.  Negative Spurling's noted.  Osteopathic findings  C2 flexed rotated and side bent right C6 flexed rotated and side bent left T3 extended rotated and side bent right inhaled rib T9 extended rotated and side bent left L2 flexed rotated and side bent right Sacrum right on right       Assessment and Plan:  Cervicalgia Mild spinal stenosis.  Discussed the potential for epidurals but at the moment he is responding relatively well to osteopathic manipulation within the encourage patient to continue to monitor radicular symptoms.  Worsening pain we can consider the epidural.  Continue the gabapentin and increase to 300 mg as needed.  Follow-up again in 4 to 8 weeks   Nonallopathic problems  Decision today to treat with OMT was based on Physical Exam  After verbal consent patient was treated with HVLA, ME, FPR techniques in cervical, rib, thoracic, lumbar, and sacral  areas  Patient tolerated the procedure well with improvement in symptoms  Patient given exercises, stretches and lifestyle modifications  See medications in patient instructions if given  Patient will follow up in 4-8 weeks      The above documentation has been reviewed and is accurate and complete , DO       Note: This dictation was prepared with Dragon dictation along with smaller phrase technology. Any transcriptional errors that result from this  process are unintentional.

## 2021-02-03 ENCOUNTER — Encounter: Payer: Self-pay | Admitting: Rehabilitative and Restorative Service Providers"

## 2021-02-03 ENCOUNTER — Other Ambulatory Visit: Payer: Self-pay

## 2021-02-03 ENCOUNTER — Ambulatory Visit (INDEPENDENT_AMBULATORY_CARE_PROVIDER_SITE_OTHER): Payer: BC Managed Care – PPO | Admitting: Rehabilitative and Restorative Service Providers"

## 2021-02-03 DIAGNOSIS — R293 Abnormal posture: Secondary | ICD-10-CM

## 2021-02-03 DIAGNOSIS — M542 Cervicalgia: Secondary | ICD-10-CM | POA: Diagnosis not present

## 2021-02-03 NOTE — Therapy (Signed)
[facility] [address] [place], [place], [address] Phone: [phone]   Fax:  [phone]  Physical Therapy Treatment  Patient Details  Name: [name] MRN: [id number] Date of Birth: 18-Jul-1975 Referring Provider (PT): Dr. Clementeen Graham   Encounter Date: 02/03/2021   PT End of Session - 02/03/21 0807     Visit Number 2    Number of Visits 16    Date for PT Re-Evaluation 03/10/21    Authorization Type BCBS    Progress Note Due on Visit 10    PT Start Time 0803    PT Stop Time 0841    PT Time Calculation (min) 38 min    Activity Tolerance Patient tolerated treatment well    Behavior During Therapy [facility] for tasks assessed/performed             Past Medical History:  Diagnosis Date   Anxiety    Celiac sprue    Headache    migraines   TMJ arthralgia     Past Surgical History:  Procedure Laterality Date   lymph node removal  2000    There were no vitals filed for this visit.   Subjective Assessment - 02/03/21 0806     Subjective Pt. stated having soreness after last visit but did feel like it helped some.    Limitations Sitting    Diagnostic tests xray, mri on neck    Currently in Pain? Yes    Pain Score 6    2-3/10 on good days after last visit   Pain Location Neck    Pain Orientation Left;Right    Pain Descriptors / Indicators Sore;Aching;Tightness    Pain Type Chronic pain    Pain Onset More than a month ago    Pain Frequency Constant    Aggravating Factors  work activity    Pain Relieving Factors treatment last visit                               [facility] Adult PT Treatment/Exercise - 02/03/21 0001       Neck Exercises: Machines for Strengthening   UBE (Upper Arm Bike) Lvl 3.5 3 mins fwd/back each way      Neck Exercises: Standing   Other Standing Exercises tband rows green 3 x 10      Neck Exercises: Prone   Other Prone Exercise qruped thoracic      Neck Exercises: Stretches    Upper Trapezius Stretch 3 reps;Left;Right   15 sec x 3 bilateral   Other Neck Stretches seated scapular retraction c bilateral UE ER      Manual Therapy   Manual therapy comments compression to Lt and Rt upper trap, regional PA mobilization G5              Trigger Point Dry Needling - 02/03/21 0001     Consent Given? Yes    Education Handout Provided No    Muscles Treated Head and Neck Upper trapezius    Upper Trapezius Response Twitch reponse elicited                    PT Short Term Goals - 02/03/21 7654       PT SHORT TERM GOAL #1   Title Patient will demonstrate independent use of home exercise program to maintain progress from in clinic treatments.    Time 3    Period Weeks    Status  Achieved    Target Date 02/03/21               PT Long Term Goals - 01/13/21 0842       PT LONG TERM GOAL #1   Title Patient will demonstrate/report pain at worst less than or equal to 2/10 to facilitate minimal limitation in daily activity secondary to pain symptoms.    Time 8    Period Weeks    Status New    Target Date 03/10/21      PT LONG TERM GOAL #2   Title Patient will demonstrate independent use of home exercise program to facilitate ability to maintain/progress functional gains from skilled physical therapy services.    Time 8    Period Weeks    Status New    Target Date 03/10/21      PT LONG TERM GOAL #3   Title Pt. will demonstrate FOTO outcome > or =68% to indicated reduced disability due to condition.    Time 8    Period Weeks    Status New    Target Date 03/10/21      PT LONG TERM GOAL #4   Title Patient will demonstrate cervical AROM WFL s symptoms to facilitate daily activity including driving, self care at PLOF s limitation due to symptoms.    Time 8    Period Weeks    Status New    Target Date 03/10/21      PT LONG TERM GOAL #5   Title Pt. will demonstrate ability to perform usual workout activity at PLOF s limitation.    Time 8     Period Weeks    Status New    Target Date 03/10/21                   Plan - 02/03/21 6073     Clinical Impression Statement Positive short term reliefs indicated from last visit.  Repeated myofascial release techniques.  Inclusion of thoracic manipulation to promote thoracic mobility gains.  Continued skilled PT services indicated at this time.    Personal Factors and Comorbidities Time since onset of injury/illness/exacerbation    Examination-Activity Limitations Carry;Sleep;Sit;Stand;Reach Overhead;Lift    Examination-Participation Restrictions Community Activity;Occupation;Other   workouts   Stability/Clinical Decision Making Stable/Uncomplicated    Rehab Potential Good    PT Frequency 2x / week    PT Duration 8 weeks    PT Treatment/Interventions ADLs/Self Care Home Management;Cryotherapy;Electrical Stimulation;Therapeutic exercise;Moist Heat;Traction;Balance training;Therapeutic activities;Iontophoresis 4mg /ml Dexamethasone;Functional mobility training;Stair training;Gait training;DME Instruction;Ultrasound;Neuromuscular re-education;Patient/family education;Passive range of motion;Spinal Manipulations;Joint Manipulations;Dry needling;Taping;Manual techniques    PT Next Visit Plan Dry needling, thoracic manipulations if need.    PT Home Exercise Plan [phone]    Consulted and Agree with Plan of Care Patient             Patient will benefit from skilled therapeutic intervention in order to improve the following deficits and impairments:  Hypomobility, Pain, Increased fascial restricitons, Decreased activity tolerance, Decreased mobility, Improper body mechanics, Impaired perceived functional ability, Impaired flexibility, Postural dysfunction, Decreased range of motion  Visit Diagnosis: Cervicalgia  Abnormal posture     Problem List Patient Active Problem List   Diagnosis Date Noted   Nonallopathic lesion of cervical region 03/31/2020   Cervicalgia 02/14/2018    Intractable chronic migraine without aura and without status migrainosus 12/17/2016   Abdominal pain, epigastric 10/05/2014   Migraine 10/20/2013   Celiac sprue 05/05/2012   13/04/2012, PT, DPT, OCS, ATC 02/03/21  8:38 AM    [facility] [address] [place], [place], [address] Phone: [phone]   Fax:  [phone]  Name: [name] MRN: [id number] Date of Birth: May 26, 1976

## 2021-02-06 ENCOUNTER — Ambulatory Visit (INDEPENDENT_AMBULATORY_CARE_PROVIDER_SITE_OTHER): Payer: BC Managed Care – PPO | Admitting: Family Medicine

## 2021-02-06 ENCOUNTER — Other Ambulatory Visit: Payer: Self-pay

## 2021-02-06 ENCOUNTER — Encounter: Payer: Self-pay | Admitting: Family Medicine

## 2021-02-06 VITALS — BP 102/74 | HR 97 | Ht 64.0 in | Wt 119.0 lb

## 2021-02-06 DIAGNOSIS — M9908 Segmental and somatic dysfunction of rib cage: Secondary | ICD-10-CM

## 2021-02-06 DIAGNOSIS — M542 Cervicalgia: Secondary | ICD-10-CM

## 2021-02-06 DIAGNOSIS — M9902 Segmental and somatic dysfunction of thoracic region: Secondary | ICD-10-CM

## 2021-02-06 DIAGNOSIS — M9904 Segmental and somatic dysfunction of sacral region: Secondary | ICD-10-CM

## 2021-02-06 DIAGNOSIS — M9901 Segmental and somatic dysfunction of cervical region: Secondary | ICD-10-CM

## 2021-02-06 DIAGNOSIS — M9903 Segmental and somatic dysfunction of lumbar region: Secondary | ICD-10-CM

## 2021-02-06 MED ORDER — DICLOFENAC SODIUM 2 % EX SOLN: 2.0000 g | Freq: Two times a day (BID) | CUTANEOUS | 3 refills | Status: DC

## 2021-02-06 NOTE — Patient Instructions (Signed)
Continue to watch neck Try dry needling once more Gabapentin 2-3 pills at night [facility] rx See me in 5-6 weeks

## 2021-02-06 NOTE — Assessment & Plan Note (Signed)
Mild spinal stenosis.  Discussed the potential for epidurals but at the moment he is responding relatively well to osteopathic manipulation within the encourage patient to continue to monitor radicular symptoms.  Worsening pain we can consider the epidural.  Continue the gabapentin and increase to 300 mg as needed.  Follow-up again in 4 to 8 weeks

## 2021-02-07 ENCOUNTER — Ambulatory Visit (INDEPENDENT_AMBULATORY_CARE_PROVIDER_SITE_OTHER): Payer: BC Managed Care – PPO | Admitting: Rehabilitative and Restorative Service Providers"

## 2021-02-07 ENCOUNTER — Encounter: Payer: Self-pay | Admitting: Rehabilitative and Restorative Service Providers"

## 2021-02-07 DIAGNOSIS — R293 Abnormal posture: Secondary | ICD-10-CM

## 2021-02-07 DIAGNOSIS — M542 Cervicalgia: Secondary | ICD-10-CM | POA: Diagnosis not present

## 2021-02-07 NOTE — Therapy (Signed)
[facility] [address] [place], [place], [address] Phone: [phone]   Fax:  [phone]  Physical Therapy Treatment  Patient Details  Name: [name] MRN: [id number] Date of Birth: 25-May-1976 Referring Provider (PT): Dr. Lynne Leader   Encounter Date: 02/07/2021   PT End of Session - 02/07/21 1211     Visit Number 3    Number of Visits 16    Date for PT Re-Evaluation 03/10/21    Authorization Type BCBS    Progress Note Due on Visit 10    PT Start Time 1144    PT Stop Time 1222    PT Time Calculation (min) 38 min    Activity Tolerance Patient tolerated treatment well    Behavior During Therapy [facility] for tasks assessed/performed             Past Medical History:  Diagnosis Date   Anxiety    Celiac sprue    Headache    migraines   TMJ arthralgia     Past Surgical History:  Procedure Laterality Date   lymph node removal  2000    There were no vitals filed for this visit.   Subjective Assessment - 02/07/21 1146     Subjective Pt. stated having soreness after last visit but did feel like it helped some.    Limitations Sitting    Diagnostic tests xray, mri on neck    Pain Score 6     Pain Location Neck    Pain Orientation Left;Right    Pain Descriptors / Indicators Tightness;Sore    Pain Type Chronic pain    Pain Onset More than a month ago    Pain Frequency Constant    Aggravating Factors  prolonged inactivity    Pain Relieving Factors improvement on 2nd day of last visit.                [facility] PT Assessment - 02/07/21 0001       Assessment   Medical Diagnosis Cervicalgia, upper trap disorder    Referring Provider (PT) Dr. Lynne Leader      AROM   Cervical Flexion 50    Cervical Extension 62    Cervical - Left Side Bend eval 74 was entered in error was Lt rotation number    Cervical - Right Rotation 80    Cervical - Left Rotation 72                           [facility] Adult PT  Treatment/Exercise - 02/07/21 0001       Exercises   Other Exercises  Review of HEP and printout provided      Neck Exercises: Standing   Other Standing Exercises standing thoracic rotation/horizontal abduction green band x 10 bilateral in warrior pose at wall.  also x 10 bilateral with no band      Neck Exercises: Prone   Other Prone Exercise qruped thoracic rotation x 10 each side    Other Prone Exercise scapular retraction 5 sec hold x 10, scapular retraction c GH ext 5 sec x 10, prone y off table 5 sec hold x 10      Manual Therapy   Manual therapy comments compression to Lt and Rt upper trap, regional PA mobilization G5              Trigger Point Dry Needling - 02/07/21 0001     Consent Given? Yes  Education Handout Provided Previously provided    Muscles Treated Head and Neck Upper trapezius   bilateral, levator insertion on Rt   Upper Trapezius Response Twitch reponse elicited                  PT Education - 02/07/21 1204     Education Details HEP additions    Person(s) Educated Patient    Methods Explanation;Demonstration;Verbal cues;Handout    Comprehension Returned demonstration;Verbalized understanding              PT Short Term Goals - 02/03/21 3818       PT SHORT TERM GOAL #1   Title Patient will demonstrate independent use of home exercise program to maintain progress from in clinic treatments.    Time 3    Period Weeks    Status Achieved    Target Date 02/03/21               PT Long Term Goals - 02/07/21 1206       PT LONG TERM GOAL #1   Title Patient will demonstrate/report pain at worst less than or equal to 2/10 to facilitate minimal limitation in daily activity secondary to pain symptoms.    Time 8    Period Weeks    Status On-going    Target Date 03/10/21      PT LONG TERM GOAL #2   Title Patient will demonstrate independent use of home exercise program to facilitate ability to maintain/progress functional gains  from skilled physical therapy services.    Time 8    Period Weeks    Status On-going    Target Date 03/10/21      PT LONG TERM GOAL #3   Title Pt. will demonstrate FOTO outcome > or =68% to indicated reduced disability due to condition.    Time 8    Period Weeks    Status On-going    Target Date 03/10/21      PT LONG TERM GOAL #4   Title Patient will demonstrate cervical AROM WFL s symptoms to facilitate daily activity including driving, self care at PLOF s limitation due to symptoms.    Time 8    Period Weeks    Status Partially Met    Target Date 03/10/21      PT LONG TERM GOAL #5   Title Pt. will demonstrate ability to perform usual workout activity at PLOF s limitation.    Time 8    Period Weeks    Status On-going    Target Date 03/10/21                   Plan - 02/07/21 1206     Clinical Impression Statement Weakness and difficulty in middle and lower trap activations noted and could be part of presentation.  Continued use of dry needling and thoracic manipulations to improve mobility.  Recommend continued skilled PT services at this time.    Personal Factors and Comorbidities Time since onset of injury/illness/exacerbation    Examination-Activity Limitations Carry;Sleep;Sit;Stand;Reach Overhead;Lift    Examination-Participation Restrictions Community Activity;Occupation;Other   workouts   Stability/Clinical Decision Making Stable/Uncomplicated    Rehab Potential Good    PT Frequency 2x / week    PT Duration 8 weeks    PT Treatment/Interventions ADLs/Self Care Home Management;Cryotherapy;Electrical Stimulation;Therapeutic exercise;Moist Heat;Traction;Balance training;Therapeutic activities;Iontophoresis 19m/ml Dexamethasone;Functional mobility training;Stair training;Gait training;DME Instruction;Ultrasound;Neuromuscular re-education;Patient/family education;Passive range of motion;Spinal Manipulations;Joint Manipulations;Dry needling;Taping;Manual techniques     PT Next Visit Plan Manual  as desired.  Continue to improve thoracic mobility c ther ex and manual and improve middle trap/lower trap activations/strength    PT Home Exercise Plan [phone]    Consulted and Agree with Plan of Care Patient             Patient will benefit from skilled therapeutic intervention in order to improve the following deficits and impairments:  Hypomobility, Pain, Increased fascial restricitons, Decreased activity tolerance, Decreased mobility, Improper body mechanics, Impaired perceived functional ability, Impaired flexibility, Postural dysfunction, Decreased range of motion  Visit Diagnosis: Cervicalgia  Abnormal posture     Problem List Patient Active Problem List   Diagnosis Date Noted   Nonallopathic lesion of cervical region 03/31/2020   Cervicalgia 02/14/2018   Intractable chronic migraine without aura and without status migrainosus 12/17/2016   Abdominal pain, epigastric 10/05/2014   Migraine 10/20/2013   Celiac sprue 05/05/2012    Scot Jun, PT, DPT, OCS, ATC 02/07/21  12:23 PM    [facility] [address] [place], [place], [address] Phone: [phone]   Fax:  [phone]  Name: [name] MRN: [id number] Date of Birth: 1976-05-18

## 2021-02-07 NOTE — Patient Instructions (Signed)
Access Code: [email or web address] URL: [email or web address] Date: 02/07/2021 Prepared by: Chyrel Masson  Exercises Seated Upper Trapezius Stretch - 2 x daily - 7 x weekly - 1 sets - 5 reps - 15 hold Prone Scapular Retraction - 2 x daily - 7 x weekly - 1 sets - 10 reps - 5 hold Prone Scapular Slide with Shoulder Extension - 2 x daily - 7 x weekly - 1 sets - 10 reps - 5 hold Shoulder External Rotation and Scapular Retraction with Resistance - 2 x daily - 7 x weekly - 3 sets - 10 reps Quadruped Thoracic Rotation - Reach Under - 2 x daily - 7 x weekly - 1 sets - 10 reps Quadruped Thoracic Rotation Full Range with Hand on Neck - 2 x daily - 7 x weekly - 1 sets - 10 reps Prone Scapular Retraction Y - 1 x daily - 7 x weekly - 1 sets - 10 reps - 5 hold

## 2021-02-10 ENCOUNTER — Ambulatory Visit (INDEPENDENT_AMBULATORY_CARE_PROVIDER_SITE_OTHER): Payer: BC Managed Care – PPO | Admitting: Rehabilitative and Restorative Service Providers"

## 2021-02-10 ENCOUNTER — Other Ambulatory Visit: Payer: Self-pay

## 2021-02-10 ENCOUNTER — Encounter: Payer: Self-pay | Admitting: Rehabilitative and Restorative Service Providers"

## 2021-02-10 DIAGNOSIS — M542 Cervicalgia: Secondary | ICD-10-CM

## 2021-02-10 DIAGNOSIS — R293 Abnormal posture: Secondary | ICD-10-CM

## 2021-02-10 NOTE — Therapy (Addendum)
[facility] [address] [place], [place], [address] Phone: [phone]   Fax:  [phone]  Physical Therapy Treatment /Discharge  Patient Details  Name: [name] MRN: [id number] Date of Birth: 09/01/1975 Referring Provider (PT): Dr. Lynne Leader   Encounter Date: 02/10/2021   PT End of Session - 02/10/21 1213     Visit Number 4    Number of Visits 16    Date for PT Re-Evaluation 03/10/21    Authorization Type BCBS    Progress Note Due on Visit 10    PT Start Time 1149    PT Stop Time 1220    PT Time Calculation (min) 31 min    Activity Tolerance Patient tolerated treatment well    Behavior During Therapy [facility] for tasks assessed/performed             Past Medical History:  Diagnosis Date   Anxiety    Celiac sprue    Headache    migraines   TMJ arthralgia     Past Surgical History:  Procedure Laterality Date   lymph node removal  2000    There were no vitals filed for this visit.   Subjective Assessment - 02/10/21 1151     Subjective Pt. indicated soreness noted in back and core after last visit.  Pt. indicated overall similar tightness from treatment areas.    Limitations Sitting    Diagnostic tests xray, mri on neck    Currently in Pain? Yes    Pain Score 5     Pain Location Neck    Pain Orientation Left;Right    Pain Descriptors / Indicators Tightness;Sore    Pain Type Chronic pain    Pain Onset More than a month ago    Aggravating Factors  work activity                               [facility] Adult PT Treatment/Exercise - 02/10/21 0001       Exercises   Other Exercises  Continued cues and education on use and routine of HEP for management at home.      Neck Exercises: Machines for Strengthening   UBE (Upper Arm Bike) Lvl 3.5 5 mins fwd/back each way      Manual Therapy   Manual therapy comments compression to Lt and Rt upper trap, regional PA mobilization G5                       PT Short Term Goals - 02/03/21 6270       PT SHORT TERM GOAL #1   Title Patient will demonstrate independent use of home exercise program to maintain progress from in clinic treatments.    Time 3    Period Weeks    Status Achieved    Target Date 02/03/21               PT Long Term Goals - 02/07/21 1206       PT LONG TERM GOAL #1   Title Patient will demonstrate/report pain at worst less than or equal to 2/10 to facilitate minimal limitation in daily activity secondary to pain symptoms.    Time 8    Period Weeks    Status On-going    Target Date 03/10/21      PT LONG TERM GOAL #2   Title Patient will demonstrate independent use of home exercise program to facilitate  ability to maintain/progress functional gains from skilled physical therapy services.    Time 8    Period Weeks    Status On-going    Target Date 03/10/21      PT LONG TERM GOAL #3   Title Pt. will demonstrate FOTO outcome > or =68% to indicated reduced disability due to condition.    Time 8    Period Weeks    Status On-going    Target Date 03/10/21      PT LONG TERM GOAL #4   Title Patient will demonstrate cervical AROM WFL s symptoms to facilitate daily activity including driving, self care at PLOF s limitation due to symptoms.    Time 8    Period Weeks    Status Partially Met    Target Date 03/10/21      PT LONG TERM GOAL #5   Title Pt. will demonstrate ability to perform usual workout activity at PLOF s limitation.    Time 8    Period Weeks    Status On-going    Target Date 03/10/21                    Patient will benefit from skilled therapeutic intervention in order to improve the following deficits and impairments:     Visit Diagnosis: Cervicalgia  Abnormal posture     Problem List Patient Active Problem List   Diagnosis Date Noted   Nonallopathic lesion of cervical region 03/31/2020   Cervicalgia 02/14/2018   Intractable chronic migraine  without aura and without status migrainosus 12/17/2016   Abdominal pain, epigastric 10/05/2014   Migraine 10/20/2013   Celiac sprue 05/05/2012   Scot Jun, PT, DPT, OCS, ATC 02/10/21  12:24 PM   PHYSICAL THERAPY DISCHARGE SUMMARY  Visits from Start of Care: 4  Current functional level related to goals / functional outcomes: See note   Remaining deficits: See note   Education / Equipment: HEP   Patient agrees to discharge. Patient goals were partially met. Patient is being discharged due to not returning since the last visit.  Scot Jun, PT, DPT, OCS, ATC 03/13/21  2:32 PM    [facility] [address] [place], [place], [address] Phone: [phone]   Fax:  [phone]  Name: [name] MRN: [id number] Date of Birth: Dec 16, 1975

## 2021-02-20 ENCOUNTER — Telehealth: Payer: Self-pay | Admitting: Rehabilitative and Restorative Service Providers"

## 2021-02-20 ENCOUNTER — Encounter: Payer: Self-pay | Admitting: Rehabilitative and Restorative Service Providers"

## 2021-02-20 NOTE — Telephone Encounter (Signed)
Called and left message after 15 mins no show for appointment today with reminder of next appointment time.  Chyrel Masson, PT, DPT, OCS, ATC 02/20/21  9:03 AM

## 2021-02-23 ENCOUNTER — Encounter: Payer: BC Managed Care – PPO | Admitting: Rehabilitative and Restorative Service Providers"

## 2021-03-14 NOTE — Progress Notes (Deleted)
  [name] [address] [place] [address] Phone: [phone] Subjective:    I'm seeing this patient by the request  of:  Farris Has, MD  CC:   [id number]  Letita ASTRAEA GAUGHRAN is a 45 y.o. female coming in with complaint of back and neck pain. OMT 02/06/2021. Patient states   Medications patient has been prescribed: Gabapentin  Taking:         Reviewed prior external information including notes and imaging from previsou exam, outside providers and external EMR if available.   As well as notes that were available from care everywhere and other healthcare systems.  Past medical history, social, surgical and family history all reviewed in electronic medical record.  No pertanent information unless stated regarding to the chief complaint.   Past Medical History:  Diagnosis Date   Anxiety    Celiac sprue    Headache    migraines   TMJ arthralgia     Allergies  Allergen Reactions   Topamax [Topiramate] Other (See Comments)    Couldn't function     Review of Systems:  No headache, visual changes, nausea, vomiting, diarrhea, constipation, dizziness, abdominal pain, skin rash, fevers, chills, night sweats, weight loss, swollen lymph nodes, body aches, joint swelling, chest pain, shortness of breath, mood changes. POSITIVE muscle aches  Objective  There were no vitals taken for this visit.   General: No apparent distress alert and oriented x3 mood and affect normal, dressed appropriately.  HEENT: Pupils equal, extraocular movements intact  Respiratory: Patient's speak in full sentences and does not appear short of breath  Cardiovascular: No lower extremity edema, non tender, no erythema  Neuro: Cranial nerves II through XII are intact, neurovascularly intact in all extremities with 2+ DTRs and 2+ pulses.  Gait normal with good balance and coordination.  MSK:  Non tender with full range of motion and good stability and symmetric  strength and tone of shoulders, elbows, wrist, hip, knee and ankles bilaterally.  Back - Normal skin, Spine with normal alignment and no deformity.  No tenderness to vertebral process palpation.  Paraspinous muscles are not tender and without spasm.   Range of motion is full at neck and lumbar sacral regions  Osteopathic findings  C2 flexed rotated and side bent right C6 flexed rotated and side bent left T3 extended rotated and side bent right inhaled rib T9 extended rotated and side bent left L2 flexed rotated and side bent right Sacrum right on right       Assessment and Plan:    Nonallopathic problems  Decision today to treat with OMT was based on Physical Exam  After verbal consent patient was treated with HVLA, ME, FPR techniques in cervical, rib, thoracic, lumbar, and sacral  areas  Patient tolerated the procedure well with improvement in symptoms  Patient given exercises, stretches and lifestyle modifications  See medications in patient instructions if given  Patient will follow up in 4-8 weeks      The above documentation has been reviewed and is accurate and complete Wilford Grist       Note: This dictation was prepared with Dragon dictation along with smaller phrase technology. Any transcriptional errors that result from this process are unintentional.

## 2021-03-15 ENCOUNTER — Ambulatory Visit: Payer: Self-pay | Admitting: Family Medicine

## 2021-03-30 ENCOUNTER — Telehealth: Payer: Self-pay | Admitting: Neurology

## 2021-03-30 NOTE — Telephone Encounter (Signed)
Patient has a Botox appointment 10/25. [employer] Botox PA has expired. I initiated new PA for 200 units of Botox for G43.711. PA was approved. PA reference #[id number] (03/29/21- 02/27/22).  I called [facility] to provide new PA information.

## 2021-04-18 ENCOUNTER — Ambulatory Visit: Payer: BC Managed Care – PPO | Admitting: Neurology

## 2021-04-20 DIAGNOSIS — G43711 Chronic migraine without aura, intractable, with status migrainosus: Secondary | ICD-10-CM | POA: Diagnosis not present

## 2021-05-08 ENCOUNTER — Ambulatory Visit (INDEPENDENT_AMBULATORY_CARE_PROVIDER_SITE_OTHER): Payer: BC Managed Care – PPO | Admitting: Neurology

## 2021-05-08 ENCOUNTER — Encounter: Payer: Self-pay | Admitting: Neurology

## 2021-05-08 DIAGNOSIS — G43711 Chronic migraine without aura, intractable, with status migrainosus: Secondary | ICD-10-CM | POA: Diagnosis not present

## 2021-05-08 MED ORDER — BACLOFEN 10 MG PO TABS: 10.0000 mg | ORAL_TABLET | Freq: Every day | ORAL | 6 refills | Status: DC

## 2021-05-08 MED ORDER — KETOROLAC TROMETHAMINE 60 MG/2ML IM SOLN
60.0000 mg | Freq: Once | INTRAMUSCULAR | Status: AC
  Administered 2021-05-08: 60 mg via INTRAMUSCULAR

## 2021-05-08 NOTE — Progress Notes (Signed)
Botox- 200 units x 1 vial Lot: [id number] Expiration: 09/2023 NDC: 9242-6834-19  Bacteriostatic 0.9% Sodium Chloride- 7mL total Lot: [id number] Expiration: 06/25/2040 NDC: 9892-1194-17  Dx: E08.144 S/P

## 2021-05-08 NOTE — Progress Notes (Signed)
Consent Form 05/11/2021; She is stable, doing fantastic, > 80% improved freqof migraines. She has not reached back out to Dr. Kirtland Bouchard yet for cervical dystonia injections since she was busy, her oldest is going to app state, she has a 44 year old daughter as well 12/01/2020 Botulism Toxin Injection For Chronic Migraine +a. 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 07/06/2020: Still excellent > 70% improvement in migraine frequency.   04/05/2020: Still excellent > 70% improvemen tin migraine frequency.   Interval history 12/23/2019: Continues excellent response. >70% improvement only 1 mild migraine a month. She saw zach smith at [facility] and feels great and is also having dry needling again.   Interval history 06/04/2019: Continues excellent response, flexeril really helping. Out of 30 days, she has had 1 migraines which is a drastic improvement. >95% decrease in frequency. She has neck pain ordered dry needling which helped. +a. Doing great on the Gralise.  Bernita Raisin did not work. Has only needed to try Nurtec once.      Consent Form Botulism Toxin Injection For Chronic Migraine    Reviewed orally with patient, additionally signature is on file:  Botulism toxin has been approved by the Federal drug administration for treatment of chronic migraine. Botulism toxin does not cure chronic migraine and it may not be effective in some patients.  The administration of botulism toxin is accomplished by injecting a small amount of toxin into the muscles of the neck and head. Dosage must be titrated for each individual. Any benefits resulting from botulism toxin tend to wear off after 3 months with a repeat injection required if benefit is to be maintained.  Injections are usually done every 3-4 months with maximum effect peak achieved by about 2 or 3 weeks. Botulism toxin is expensive and you should be sure of what costs you will incur resulting from the injection.  The side effects of botulism toxin use for chronic migraine may include:   -Transient, and usually mild, facial weakness with facial injections  -Transient, and usually mild, head or neck weakness with head/neck injections  -Reduction or loss of forehead facial animation due to forehead muscle weakness  -Eyelid drooping  -Dry eye  -Pain at the site of injection or bruising at the site of injection  -Double vision  -Potential unknown long term risks  Contraindications: You should not have Botox if you are pregnant, nursing, allergic to albumin, have an infection, skin condition, or muscle weakness at the site of the injection, or have myasthenia gravis, Lambert-Eaton syndrome, or ALS.  It is also possible that as with any injection, there may be an allergic reaction or no effect from the medication. Reduced effectiveness after repeated injections is sometimes seen and rarely infection at the injection site may occur. All care will be taken to prevent these side effects. If therapy is given over a long time, atrophy and wasting in the muscle injected may occur. Occasionally the patient's become refractory to treatment because they develop antibodies to the toxin. In this event, therapy needs to be modified.  I have read the above information and consent to the administration of botulism toxin.    BOTOX PROCEDURE NOTE FOR MIGRAINE HEADACHE    Contraindications and precautions discussed with patient(above). Aseptic procedure was observed and patient tolerated procedure.  Procedure performed by Dr. Artemio Aly  The condition has existed for more than 6 months, and pt does not have a diagnosis of ALS, Myasthenia Gravis or Lambert-Eaton Syndrome.  Risks and benefits of injections discussed  and pt agrees to proceed with the procedure.  Written consent obtained  These injections are medically necessary. Pt  receives good benefits from these injections. These injections do not cause sedations or hallucinations which the oral therapies may cause.  Description of procedure:  The patient was placed in a sitting position. The standard protocol was used for Botox as follows, with 5 units of Botox injected at each site:   -Procerus muscle, midline injection  -Corrugator muscle, bilateral injection  -Frontalis muscle, bilateral injection, with 2 sites each side, medial injection was performed in the upper one third of the frontalis muscle, in the region vertical from the medial inferior edge of the superior orbital rim. The lateral injection was again in the upper one third of the forehead vertically above the lateral limbus of the cornea, 1.5 cm lateral to the medial injection site.  -Temporalis muscle injection, 4 sites, bilaterally. The first injection was 3 cm above the tragus of the ear, second injection site was 1.5 cm to 3 cm up from the first injection site in line with the tragus of the ear. The third injection site was 1.5-3 cm forward between the first 2 injection sites. The fourth injection site was 1.5 cm posterior to the second injection site.   -Occipitalis muscle injection, 3 sites, bilaterally. The first injection was done one half way between the occipital protuberance and the tip of the mastoid process behind the ear. The second injection site was done lateral and superior to the first, 1 fingerbreadth from the first injection. The third injection site was 1 fingerbreadth superiorly and medially from the first injection site.  -Cervical paraspinal muscle injection, 2 sites, bilateral knee first injection site was 1 cm from the midline of the cervical spine, 3 cm inferior to the lower border of the occipital protuberance. The second injection site was 1.5 cm superiorly and  laterally to the first injection site.  -Trapezius muscle injection was performed at 3 sites, bilaterally. The first injection site was in the upper trapezius muscle halfway between the inflection point of the neck, and the acromion. The second injection site was one half way between the acromion and the first injection site. The third injection was done between the first injection site and the inflection point of the neck.   Will return for repeat injection in 3 months.   155 units of Botox was used, 45u Botox not injected was wasted. The patient tolerated the procedure well, there were no complications of the above procedure.

## 2021-07-06 ENCOUNTER — Other Ambulatory Visit: Payer: Self-pay | Admitting: Family Medicine

## 2021-08-07 ENCOUNTER — Encounter: Payer: Self-pay | Admitting: Neurology

## 2021-08-07 DIAGNOSIS — G43711 Chronic migraine without aura, intractable, with status migrainosus: Secondary | ICD-10-CM | POA: Diagnosis not present

## 2021-08-08 ENCOUNTER — Ambulatory Visit: Payer: BC Managed Care – PPO | Admitting: Neurology

## 2021-08-10 DIAGNOSIS — R197 Diarrhea, unspecified: Secondary | ICD-10-CM | POA: Diagnosis not present

## 2021-08-10 DIAGNOSIS — R509 Fever, unspecified: Secondary | ICD-10-CM | POA: Diagnosis not present

## 2021-08-10 DIAGNOSIS — Z03818 Encounter for observation for suspected exposure to other biological agents ruled out: Secondary | ICD-10-CM | POA: Diagnosis not present

## 2021-08-16 ENCOUNTER — Ambulatory Visit (INDEPENDENT_AMBULATORY_CARE_PROVIDER_SITE_OTHER): Payer: BC Managed Care – PPO | Admitting: Neurology

## 2021-08-16 VITALS — BP 106/63 | HR 78

## 2021-08-16 DIAGNOSIS — G43711 Chronic migraine without aura, intractable, with status migrainosus: Secondary | ICD-10-CM | POA: Diagnosis not present

## 2021-08-16 MED ORDER — KETOROLAC TROMETHAMINE 60 MG/2ML IM SOLN
60.0000 mg | Freq: Once | INTRAMUSCULAR | Status: AC
  Administered 2021-08-16: 60 mg via INTRAMUSCULAR

## 2021-08-16 NOTE — Progress Notes (Signed)
Per Dr. Lucia Gaskins gave Toradol 60mg . Patient laying on her side when Medication was given . Placed bandade on injection site Pt thanked me .

## 2021-08-16 NOTE — Progress Notes (Signed)
Botox- 200 units x 1 vial Lot: [id number] Expiration: 02/2024 NDC: [id number]  Bacteriostatic 0.9% Sodium Chloride- 75mL total Lot: [id number] Expiration: 03/25/2021 NDC: [id number]  Dx: [id number] S/p  Toradol 60 mg IM administered per v.o. Dr. Jaynee Eagles in R Ventrogluteal muscle. Pt tolerated well. Bandaid applied. See MAR.   Consent Form 08/16/2021: stable doing great 05/11/2021; She is stable, doing fantastic, > 80% improved freqof migraines. She has not reached back out to Dr. Raliegh Ip yet for cervical dystonia injections since she was busy, her oldest is going to app state, she has a 19 year old daughter as well 12/01/2020 Botulism Toxin Injection For Chronic Migraine +a. 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 07/06/2020: Still excellent > 70% improvement in migraine frequency.   04/05/2020: Still excellent > 70% improvemen tin migraine frequency.   Interval history 12/23/2019: Continues excellent response. >70% improvement only 1 mild migraine a month. She saw zach smith at [facility] and feels great and is also having dry needling again.   Interval history 06/04/2019: Continues excellent response, flexeril really helping. Out of 30 days, she has had 1 migraines which is a drastic improvement. >95% decrease in frequency. She has neck pain ordered dry needling which helped. +a. Doing great on the Gralise.  Roselyn Meier did not work. Has only needed to try Nurtec once.      Consent Form Botulism Toxin Injection For Chronic Migraine    Reviewed orally with patient, additionally signature is on file:  Botulism toxin has been approved by the Federal drug administration for treatment of chronic migraine. Botulism toxin does not cure chronic migraine and it  may not be effective in some patients.  The administration of botulism toxin is accomplished by injecting a small amount of toxin into the muscles of the neck and head. Dosage must be titrated for each individual. Any benefits resulting from botulism toxin tend to wear off after 3 months with a repeat injection required if benefit is to be maintained. Injections are usually done every 3-4 months with maximum effect peak achieved by about 2 or 3 weeks. Botulism toxin is expensive and you should be sure of what costs you will incur resulting from the injection.  The side effects of botulism toxin use for chronic migraine may include:   -Transient, and usually mild, facial weakness with facial injections  -Transient, and usually mild, head or neck weakness with head/neck injections  -Reduction or loss of forehead facial animation due to forehead muscle weakness  -Eyelid drooping  -Dry eye  -Pain at the site of injection or bruising at the site of injection  -Double vision  -Potential unknown long term risks  Contraindications: You should not have Botox if you are pregnant, nursing, allergic to albumin, have an infection, skin condition, or muscle weakness at the site of the injection, or have myasthenia gravis, Lambert-Eaton syndrome, or ALS.  It is also possible that as with any injection, there may be an allergic reaction or no effect from the medication. Reduced effectiveness after repeated injections is sometimes seen and rarely infection at the injection site may occur. All care will be taken to prevent these side effects. If therapy is given over a long time, atrophy and wasting in the muscle injected may occur. Occasionally the patient's become refractory to treatment because they  develop antibodies to the toxin. In this event, therapy needs to be modified.  I have read the above information and consent to the administration of botulism toxin.    BOTOX PROCEDURE NOTE FOR MIGRAINE  HEADACHE    Contraindications and precautions discussed with patient(above). Aseptic procedure was observed and patient tolerated procedure. Procedure performed by Dr. Georgia Dom  The condition has existed for more than 6 months, and pt does not have a diagnosis of ALS, Myasthenia Gravis or Lambert-Eaton Syndrome.  Risks and benefits of injections discussed and pt agrees to proceed with the procedure.  Written consent obtained  These injections are medically necessary. Pt  receives good benefits from these injections. These injections do not cause sedations or hallucinations which the oral therapies may cause.  Description of procedure:  The patient was placed in a sitting position. The standard protocol was used for Botox as follows, with 5 units of Botox injected at each site:   -Procerus muscle, midline injection  -Corrugator muscle, bilateral injection  -Frontalis muscle, bilateral injection, with 2 sites each side, medial injection was performed in the upper one third of the frontalis muscle, in the region vertical from the medial inferior edge of the superior orbital rim. The lateral injection was again in the upper one third of the forehead vertically above the lateral limbus of the cornea, 1.5 cm lateral to the medial injection site.  -Temporalis muscle injection, 4 sites, bilaterally. The first injection was 3 cm above the tragus of the ear, second injection site was 1.5 cm to 3 cm up from the first injection site in line with the tragus of the ear. The third injection site was 1.5-3 cm forward between the first 2 injection sites. The fourth injection site was 1.5 cm posterior to the second injection site.   -Occipitalis muscle injection, 3 sites, bilaterally. The first injection was done one half way between the occipital protuberance and the tip of the mastoid process behind the ear. The second injection site was done lateral and superior to the first, 1 fingerbreadth from the first  injection. The third injection site was 1 fingerbreadth superiorly and medially from the first injection site.  -Cervical paraspinal muscle injection, 2 sites, bilateral knee first injection site was 1 cm from the midline of the cervical spine, 3 cm inferior to the lower border of the occipital protuberance. The second injection site was 1.5 cm superiorly and laterally to the first injection site.  -Trapezius muscle injection was performed at 3 sites, bilaterally. The first injection site was in the upper trapezius muscle halfway between the inflection point of the neck, and the acromion. The second injection site was one half way between the acromion and the first injection site. The third injection was done between the first injection site and the inflection point of the neck.   Will return for repeat injection in 3 months.   155 units of Botox was used, 45u Botox not injected was wasted. The patient tolerated the procedure well, there were no complications of the above procedure.

## 2021-10-19 ENCOUNTER — Telehealth: Payer: Self-pay | Admitting: Neurology

## 2021-10-19 MED ORDER — BOTOX 200 UNITS IJ SOLR: INTRAMUSCULAR | 3 refills | Status: DC

## 2021-10-19 NOTE — Telephone Encounter (Signed)
Please send Botox RX to [facility]. 

## 2021-10-25 DIAGNOSIS — H5213 Myopia, bilateral: Secondary | ICD-10-CM | POA: Diagnosis not present

## 2021-10-25 DIAGNOSIS — H31003 Unspecified chorioretinal scars, bilateral: Secondary | ICD-10-CM | POA: Diagnosis not present

## 2021-10-31 ENCOUNTER — Ambulatory Visit: Payer: BC Managed Care – PPO | Admitting: Neurology

## 2021-11-02 DIAGNOSIS — H04123 Dry eye syndrome of bilateral lacrimal glands: Secondary | ICD-10-CM | POA: Diagnosis not present

## 2021-11-02 DIAGNOSIS — H31003 Unspecified chorioretinal scars, bilateral: Secondary | ICD-10-CM | POA: Diagnosis not present

## 2021-11-06 DIAGNOSIS — G43711 Chronic migraine without aura, intractable, with status migrainosus: Secondary | ICD-10-CM | POA: Diagnosis not present

## 2021-11-07 DIAGNOSIS — F411 Generalized anxiety disorder: Secondary | ICD-10-CM | POA: Diagnosis not present

## 2021-11-07 DIAGNOSIS — R0982 Postnasal drip: Secondary | ICD-10-CM | POA: Diagnosis not present

## 2021-11-07 DIAGNOSIS — J029 Acute pharyngitis, unspecified: Secondary | ICD-10-CM | POA: Diagnosis not present

## 2021-11-07 NOTE — Telephone Encounter (Signed)
Received (1) 200 unit vial of Botox from [facility]. 

## 2021-11-14 ENCOUNTER — Encounter: Payer: Self-pay | Admitting: Neurology

## 2021-11-14 ENCOUNTER — Ambulatory Visit: Payer: BC Managed Care – PPO | Admitting: Neurology

## 2021-12-04 ENCOUNTER — Encounter: Payer: Self-pay | Admitting: Neurology

## 2021-12-04 DIAGNOSIS — Z0289 Encounter for other administrative examinations: Secondary | ICD-10-CM

## 2021-12-20 ENCOUNTER — Ambulatory Visit (INDEPENDENT_AMBULATORY_CARE_PROVIDER_SITE_OTHER): Payer: BC Managed Care – PPO | Admitting: Neurology

## 2021-12-20 DIAGNOSIS — G43711 Chronic migraine without aura, intractable, with status migrainosus: Secondary | ICD-10-CM | POA: Diagnosis not present

## 2021-12-20 MED ORDER — KETOROLAC TROMETHAMINE 60 MG/2ML IM SOLN
60.0000 mg | Freq: Once | INTRAMUSCULAR | Status: AC
  Administered 2021-12-20: 60 mg via INTRAMUSCULAR

## 2021-12-20 MED ORDER — UBRELVY 100 MG PO TABS: 100.0000 mg | ORAL_TABLET | ORAL | 11 refills | Status: DC | PRN

## 2021-12-20 NOTE — Progress Notes (Signed)
Botox- 200 units x 1 vial Lot: [id number] Expiration: 02/2024 NDC: 2836-6294-76  Bacteriostatic 0.9% Sodium Chloride- 67mL total Lot: [id number] Expiration: 01/24/2024 NDC: 5465-0354-65  Dx: [id number] S/P

## 2021-12-20 NOTE — Progress Notes (Signed)
Consent Form 12/20/2021: Doing great. She has 4 migraine days a month and < 8 total headache days a month 08/16/2021: stable doing great. Start [name]  05/11/2021; She is stable, doing fantastic, > 80% improved freqof migraines. She has not reached back out to Dr. Kirtland [name] yet for cervical dystonia injections since she was busy, her oldest is going to app state, she has a 45 year old daughter as well 12/01/2020 Botulism Toxin Injection For Chronic Migraine +a. 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 07/06/2020: Still excellent > 70% improvement in migraine frequency.   04/05/2020: Still excellent > 70% improvemen tin migraine frequency.   Interval history 12/23/2019: Continues excellent response. >70% improvement only 1 mild migraine a month. She saw [name] at [facility] and feels great and is also having dry needling again.   Interval history 06/04/2019: Continues excellent response, flexeril really helping. Out of 30 days, she has had 1 migraines which is a drastic improvement. >95% decrease in frequency. She has neck pain ordered dry needling which helped. +a. Doing great on the Gralise.  [name] did not work. Has only needed to try Nurtec once.      Consent Form Botulism Toxin Injection For Chronic Migraine    Reviewed orally with patient, additionally signature is on file:  Botulism toxin has been approved by the Federal drug administration for treatment of chronic migraine. Botulism toxin does not cure chronic migraine and it may not be effective in some patients.  The administration of botulism toxin is accomplished by injecting a small amount of toxin into the muscles of the neck and head. Dosage must be titrated for each individual. Any  benefits resulting from botulism toxin tend to wear off after 3 months with a repeat injection required if benefit is to be maintained. Injections are usually done every 3-4 months with maximum effect peak achieved by about 2 or 3 weeks. Botulism toxin is expensive and you should be sure of what costs you will incur resulting from the injection.  The side effects of botulism toxin use for chronic migraine may include:   -Transient, and usually mild, facial weakness with facial injections  -Transient, and usually mild, head or neck weakness with head/neck injections  -Reduction or loss of forehead facial animation due to forehead muscle weakness  -Eyelid drooping  -Dry eye  -Pain at the site of injection or bruising at the site of injection  -Double vision  -Potential unknown long term risks  Contraindications: You should not have Botox if you are pregnant, nursing, allergic to albumin, have an infection, skin condition, or muscle weakness at the site of the injection, or have myasthenia gravis, Lambert-Eaton syndrome, or ALS.  It is also possible that as with any injection, there may be an allergic reaction or no effect from the medication. Reduced effectiveness after repeated injections is sometimes seen and rarely infection at the injection site may occur. All care will be taken to prevent these side effects. If therapy is given over a long time, atrophy and wasting in the muscle injected may occur. Occasionally the patient's become refractory to treatment because they develop antibodies to the toxin. In this event, therapy needs to be modified.  I have read the above information and consent to the administration of botulism toxin.  BOTOX PROCEDURE NOTE FOR MIGRAINE HEADACHE    Contraindications and precautions discussed with patient(above). Aseptic procedure was observed and patient tolerated procedure. Procedure performed by Dr. Artemio Aly  The condition has existed for more than 6  months, and pt does not have a diagnosis of ALS, Myasthenia Gravis or Lambert-Eaton Syndrome.  Risks and benefits of injections discussed and pt agrees to proceed with the procedure.  Written consent obtained  These injections are medically necessary. Pt  receives good benefits from these injections. These injections do not cause sedations or hallucinations which the oral therapies may cause.  Description of procedure:  The patient was placed in a sitting position. The standard protocol was used for Botox as follows, with 5 units of Botox injected at each site:   -Procerus muscle, midline injection  -Corrugator muscle, bilateral injection  -Frontalis muscle, bilateral injection, with 2 sites each side, medial injection was performed in the upper one third of the frontalis muscle, in the region vertical from the medial inferior edge of the superior orbital rim. The lateral injection was again in the upper one third of the forehead vertically above the lateral limbus of the cornea, 1.5 cm lateral to the medial injection site.  -Temporalis muscle injection, 4 sites, bilaterally. The first injection was 3 cm above the tragus of the ear, second injection site was 1.5 cm to 3 cm up from the first injection site in line with the tragus of the ear. The third injection site was 1.5-3 cm forward between the first 2 injection sites. The fourth injection site was 1.5 cm posterior to the second injection site.   -Occipitalis muscle injection, 3 sites, bilaterally. The first injection was done one half way between the occipital protuberance and the tip of the mastoid process behind the ear. The second injection site was done lateral and superior to the first, 1 fingerbreadth from the first injection. The third injection site was 1 fingerbreadth superiorly and medially from the first injection site.  -Cervical paraspinal muscle injection, 2 sites, bilateral knee first injection site was 1 cm from the midline of  the cervical spine, 3 cm inferior to the lower border of the occipital protuberance. The second injection site was 1.5 cm superiorly and laterally to the first injection site.  -Trapezius muscle injection was performed at 3 sites, bilaterally. The first injection site was in the upper trapezius muscle halfway between the inflection point of the neck, and the acromion. The second injection site was one half way between the acromion and the first injection site. The third injection was done between the first injection site and the inflection point of the neck.   Will return for repeat injection in 3 months.   155 units of Botox was used, 45u Botox not injected was wasted. The patient tolerated the procedure well, there were no complications of the above procedure.

## 2021-12-20 NOTE — Progress Notes (Signed)
V. O for Toradol 60mg  IM per Dr. .   Under aseptic technique toradol 60mg  20ml IM given  R upper outer gluteal quadrant .  Tolerated well.  Bandaid applied.

## 2022-01-23 DIAGNOSIS — G43711 Chronic migraine without aura, intractable, with status migrainosus: Secondary | ICD-10-CM | POA: Diagnosis not present

## 2022-01-29 NOTE — Telephone Encounter (Signed)
Received (1) 200 unit vial of Botox from [facility]. 

## 2022-02-01 ENCOUNTER — Encounter: Payer: Self-pay | Admitting: *Deleted

## 2022-02-01 NOTE — Telephone Encounter (Signed)
Completed Bernita Raisin renewal PA on Cover My Meds. [employer]on [employer] - Rx #: [id number]. Awaiting determination from [employer] within 72 hours. Pt aware.

## 2022-02-05 NOTE — Telephone Encounter (Signed)
Botox PA expires 02/27/22. I completed a new PA on Cover My Meds. Key: [id number]. Awaiting determination from [employer].

## 2022-02-05 NOTE — Telephone Encounter (Signed)
Your request has been approved for Ubrelvy on CMM.  Effective from 02/01/2022 through 01/31/2023. Message sent to pt.

## 2022-02-05 NOTE — Telephone Encounter (Signed)
Per Cover My Meds, [employer] medical benefit has approved Botox 200 unit vial for G43.711 today [id number] effective from 02/05/2022 through 01/06/2023. I called [facility] and provided the update of the approval. They will work on verifying this and it will take 2-3 business days then they will call our office.   Will need to schedule shipment for 03/14/22 appt.

## 2022-02-13 ENCOUNTER — Ambulatory Visit: Payer: BC Managed Care – PPO | Admitting: Neurology

## 2022-03-14 ENCOUNTER — Ambulatory Visit: Payer: BC Managed Care – PPO | Admitting: Neurology

## 2022-03-15 ENCOUNTER — Encounter: Payer: Self-pay | Admitting: Neurology

## 2022-03-20 DIAGNOSIS — R399 Unspecified symptoms and signs involving the genitourinary system: Secondary | ICD-10-CM | POA: Diagnosis not present

## 2022-03-20 DIAGNOSIS — Z03818 Encounter for observation for suspected exposure to other biological agents ruled out: Secondary | ICD-10-CM | POA: Diagnosis not present

## 2022-03-20 DIAGNOSIS — J029 Acute pharyngitis, unspecified: Secondary | ICD-10-CM | POA: Diagnosis not present

## 2022-03-21 ENCOUNTER — Telehealth: Payer: Self-pay | Admitting: Neurology

## 2022-03-21 NOTE — Telephone Encounter (Signed)
Pt called stating that she is ill and will not be able to come to her BOTOX appt.There were no avail appts till 10/31. Pt would like to know if she can be fit in sooner than that. Please advise.

## 2022-03-21 NOTE — Telephone Encounter (Signed)
Spoke to patient rescheduled Botox Appointment for 04/02/2022

## 2022-03-22 ENCOUNTER — Ambulatory Visit: Payer: BC Managed Care – PPO | Admitting: Neurology

## 2022-04-02 ENCOUNTER — Ambulatory Visit (INDEPENDENT_AMBULATORY_CARE_PROVIDER_SITE_OTHER): Payer: BC Managed Care – PPO | Admitting: Neurology

## 2022-04-02 DIAGNOSIS — G43711 Chronic migraine without aura, intractable, with status migrainosus: Secondary | ICD-10-CM

## 2022-04-02 MED ORDER — ONABOTULINUMTOXINA 200 UNITS IJ SOLR
155.0000 [IU] | Freq: Once | INTRAMUSCULAR | Status: AC
  Administered 2022-04-02: 155 [IU] via INTRAMUSCULAR

## 2022-04-02 MED ORDER — KETOROLAC TROMETHAMINE 60 MG/2ML IM SOLN
60.0000 mg | Freq: Once | INTRAMUSCULAR | Status: AC
  Administered 2022-04-02: 60 mg via INTRAMUSCULAR

## 2022-04-02 NOTE — Progress Notes (Signed)
Consent Form 12/20/2021: Doing great. She has 4 migraine days a month and < 8 total headache days a month 08/16/2021: stable doing great. Start Bernita Raisin  05/11/2021; She is stable, doing fantastic, > 80% improved freqof migraines. She has not reached back out to Dr. Kirtland Bouchard yet for cervical dystonia injections since she was busy, her oldest is going to app state, she has a 45 year old daughter as well 12/01/2020 Botulism Toxin Injection For Chronic Migraine +a. 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 07/06/2020: Still excellent > 70% improvement in migraine frequency.   04/05/2020: Still excellent > 70% improvemen tin migraine frequency.   Interval history 12/23/2019: Continues excellent response. >70% improvement only 1 mild migraine a month. She saw zach smith at [facility] and feels great and is also having dry needling again.   Interval history 06/04/2019: Continues excellent response, flexeril really helping. Out of 30 days, she has had 1 migraines which is a drastic improvement. >95% decrease in frequency. She has neck pain ordered dry needling which helped. +a. Doing great on the Gralise.  Bernita Raisin did not work. Has only needed to try Nurtec once.      Consent Form Botulism Toxin Injection For Chronic Migraine    Reviewed orally with patient, additionally signature is on file:  Botulism toxin has been approved by the Federal drug administration for treatment of chronic migraine. Botulism toxin does not cure chronic migraine and it may not be effective in some patients.  The administration of botulism toxin is accomplished by injecting a small amount of toxin into the muscles of the neck and head. Dosage must be titrated for each individual. Any  benefits resulting from botulism toxin tend to wear off after 3 months with a repeat injection required if benefit is to be maintained. Injections are usually done every 3-4 months with maximum effect peak achieved by about 2 or 3 weeks. Botulism toxin is expensive and you should be sure of what costs you will incur resulting from the injection.  The side effects of botulism toxin use for chronic migraine may include:   -Transient, and usually mild, facial weakness with facial injections  -Transient, and usually mild, head or neck weakness with head/neck injections  -Reduction or loss of forehead facial animation due to forehead muscle weakness  -Eyelid drooping  -Dry eye  -Pain at the site of injection or bruising at the site of injection  -Double vision  -Potential unknown long term risks  Contraindications: You should not have Botox if you are pregnant, nursing, allergic to albumin, have an infection, skin condition, or muscle weakness at the site of the injection, or have myasthenia gravis, Lambert-Eaton syndrome, or ALS.  It is also possible that as with any injection, there may be an allergic reaction or no effect from the medication. Reduced effectiveness after repeated injections is sometimes seen and rarely infection at the injection site may occur. All care will be taken to prevent these side effects. If therapy is given over a long time, atrophy and wasting in the muscle injected may occur. Occasionally the patient's become refractory to treatment because they develop antibodies to the toxin. In this event, therapy needs to be modified.  I have read the above information and consent to the administration of botulism toxin.  BOTOX PROCEDURE NOTE FOR MIGRAINE HEADACHE    Contraindications and precautions discussed with patient(above). Aseptic procedure was observed and patient tolerated procedure. Procedure performed by Dr. Georgia Dom  The condition has existed for more than 6  months, and pt does not have a diagnosis of ALS, Myasthenia Gravis or Lambert-Eaton Syndrome.  Risks and benefits of injections discussed and pt agrees to proceed with the procedure.  Written consent obtained  These injections are medically necessary. Pt  receives good benefits from these injections. These injections do not cause sedations or hallucinations which the oral therapies may cause.  Description of procedure:  The patient was placed in a sitting position. The standard protocol was used for Botox as follows, with 5 units of Botox injected at each site:   -Procerus muscle, midline injection  -Corrugator muscle, bilateral injection  -Frontalis muscle, bilateral injection, with 2 sites each side, medial injection was performed in the upper one third of the frontalis muscle, in the region vertical from the medial inferior edge of the superior orbital rim. The lateral injection was again in the upper one third of the forehead vertically above the lateral limbus of the cornea, 1.5 cm lateral to the medial injection site.  -Temporalis muscle injection, 4 sites, bilaterally. The first injection was 3 cm above the tragus of the ear, second injection site was 1.5 cm to 3 cm up from the first injection site in line with the tragus of the ear. The third injection site was 1.5-3 cm forward between the first 2 injection sites. The fourth injection site was 1.5 cm posterior to the second injection site.   -Occipitalis muscle injection, 3 sites, bilaterally. The first injection was done one half way between the occipital protuberance and the tip of the mastoid process behind the ear. The second injection site was done lateral and superior to the first, 1 fingerbreadth from the first injection. The third injection site was 1 fingerbreadth superiorly and medially from the first injection site.  -Cervical paraspinal muscle injection, 2 sites, bilateral knee first injection site was 1 cm from the midline of  the cervical spine, 3 cm inferior to the lower border of the occipital protuberance. The second injection site was 1.5 cm superiorly and laterally to the first injection site.  -Trapezius muscle injection was performed at 3 sites, bilaterally. The first injection site was in the upper trapezius muscle halfway between the inflection point of the neck, and the acromion. The second injection site was one half way between the acromion and the first injection site. The third injection was done between the first injection site and the inflection point of the neck.   Will return for repeat injection in 3 months.   155 units of Botox was used, 45u Botox not injected was wasted. The patient tolerated the procedure well, there were no complications of the above procedure.

## 2022-04-02 NOTE — Progress Notes (Signed)
Botox- 200 units x 1 vial Lot: [id number] Expiration: 07/2024 NDC: 6283-6629-47  Bacteriostatic 0.9% Sodium Chloride- 36mL total Lot: [id number] Expiration: 07/2022 NDC: 6546-5035-46  Dx: F68.127 S/P

## 2022-04-02 NOTE — Progress Notes (Signed)
Per  Dr Jaynee Eagles [name] 60mg  of toradol injection to patient [name] on left outer upper quadrant . Patient was lying on side when [name] injection Placed bandade on injection site . Let patient lay for 5 minutes went back pt states she was ok Informed Dr Jaynee Eagles patient was ready for Botox injections

## 2022-04-24 ENCOUNTER — Telehealth: Payer: Self-pay | Admitting: Neurology

## 2022-04-24 NOTE — Telephone Encounter (Signed)
VM left and MyChart msg sent informing pt of r/s needed for 06/27/22 appt- MD out.

## 2022-04-30 NOTE — Progress Notes (Unsigned)
[name] [address] [place] [address] Phone: [phone] Subjective:   Bruce Donath, am serving as a scribe for Dr. Antoine Primas.  I'm seeing this patient by the request  of:  Farris Has, MD  CC: Neck pain, headache follow-up  [id number]  [name] is a 46 y.o. female coming in with complaint of B shoulder, neck and scapular pain. Last seen in 2022 for OMT. Patient states that she stretches, dry needling, and gets massages. Denies any radiating symptoms.  Still having significant headaches from time to time.  With the start of the patient was getting Botox that was making a little improvement but never seem to completely resolved.    Past Medical History:  Diagnosis Date   Anxiety    Celiac sprue    Headache    migraines   TMJ arthralgia    Past Surgical History:  Procedure Laterality Date   lymph node removal  2000   Social History   Socioeconomic History   Marital status: Married    Spouse name: Brett Canales   Number of children: 2   Years of education: 16   Highest education level: Not on file  Occupational History   Occupation: Surveyor, minerals: [employer]  Tobacco Use   Smoking status: Former    Types: Cigarettes    Quit date: 06/28/1999    Years since quitting: 22.8   Smokeless tobacco: Never  Vaping Use   Vaping Use: Never used  Substance and Sexual Activity   Alcohol use: No    Comment: 1-2/year   Drug use: No   Sexual activity: Not on file  Other Topics Concern   Not on file  Social History Narrative   Married 2 children   Right handed   BSBA   3-4 cups daily, 11/23/16 decreased by 2/3    Social Determinants of Health   Financial Resource Strain: Not on file  Food Insecurity: Not on file  Transportation Needs: Not on file  Physical Activity: Not on file  Stress: Not on file  Social Connections: Not on file   Allergies  Allergen Reactions   Topamax [Topiramate] Other  (See Comments)    Couldn't function   Family History  Problem Relation Age of Onset   Hypertension Mother    Diabetes Mother    Celiac disease Mother    Coronary artery disease Maternal Grandfather    Pulmonary fibrosis Father     Current Outpatient Medications (Endocrine & Metabolic):    levonorgestrel (MIRENA) 20 MCG/24HR IUD, 1 each by Intrauterine route once.    Current Outpatient Medications (Analgesics):    Diclofenac Potassium,Migraine, (CAMBIA) 50 MG PACK, MIX ONE PACKET WITH 1-2 OUNCES OF WATER AS NEEDED FOR MIGRAINE. DRINK IMMEDIATELY AS A SINGLE DOSE.   rizatriptan (MAXALT-MLT) 10 MG disintegrating tablet, DISSOLVE 1 TABLET IN THE MOUTH AS NEEDED FOR MIGRAINE MAY REPEAT IN 2 HOURS AS NEEDED MAX DAILY DOSE IN 24 HOURS   Ubrogepant (UBRELVY) 100 MG TABS, Take 100 mg by mouth every 2 (two) hours as needed. Maximum 200mg  a day.   Current Outpatient Medications (Other):    baclofen (LIORESAL) 10 MG tablet, Take 1 tablet (10 mg total) by mouth at bedtime.   BOTOX 200 units SOLR, INJECT 155 UNITS INTO THE MUSCLES OF HEAD AND NECK EVERY 12 WEEKS , BY PRESCRIBER IN OFFICE FOR CHRONIC MIGRAINE ( DISCARD UNUSED PORTION).   gabapentin (NEURONTIN) 100 MG capsule, TAKE  2 CAPSULES(200 MG) BY MOUTH AT BEDTIME   hyoscyamine (LEVSIN SL) 0.125 MG SL tablet, DISSOLVE 1 TABLET UNDER THE TONGUE EVERY 4 HOURS AS NEEDED FOR ABDOMINAL CRAMPING   linaclotide (LINZESS) 145 MCG CAPS capsule, Take 145 mcg by mouth daily.   Multiple Vitamin (MULTIVITAMIN) tablet, Take 1 tablet by mouth daily.   venlafaxine XR (EFFEXOR XR) 37.5 MG 24 hr capsule, Take 1 capsule (37.5 mg total) by mouth daily with breakfast.   ALPRAZolam (XANAX) 0.25 MG tablet, 0.25 mg as needed.   Diclofenac Sodium 2 % SOLN, Place 2 g onto the skin 2 (two) times daily.   VITAMIN D PO, Take by mouth.   Reviewed prior external information including notes and imaging from  primary care provider As well as notes that were available from  care everywhere and other healthcare systems.  Past medical history, social, surgical and family history all reviewed in electronic medical record.  No pertanent information unless stated regarding to the chief complaint.   Review of Systems:  No headache, visual changes, nausea, vomiting, diarrhea, constipation, dizziness, abdominal pain, skin rash, fevers, chills, night sweats, weight loss, swollen lymph nodes, body aches, joint swelling, chest pain, shortness of breath, mood changes. POSITIVE muscle aches  Objective  Blood pressure 102/64, pulse 72, height 5\' 4"  (1.626 m), weight 124 lb (56.2 kg), SpO2 99 %.   General: No apparent distress alert and oriented x3 mood and affect normal, dressed appropriately.  HEENT: Pupils equal, extraocular movements intact  Respiratory: Patient's speak in full sentences and does not appear short of breath  Cardiovascular: No lower extremity edema, non tender, no erythema  Mild hypermobility noted.  Neck exam does show some loss of lordosis.  Patient does have some tightness in the occipital area.  Patient does have some tightness in the parascapular region as well.  Negative Spurling's.  Osteopathic findings C2 flexed rotated and side bent right C4 flexed rotated and side bent left C6 flexed rotated and side bent left T3 extended rotated and side bent right inhaled third rib T9 extended rotated and side bent left L2 flexed rotated and side bent right Sacrum right on right      Impression and Recommendations:    The above documentation has been reviewed and is accurate and complete [name], DO

## 2022-05-02 ENCOUNTER — Ambulatory Visit (INDEPENDENT_AMBULATORY_CARE_PROVIDER_SITE_OTHER): Payer: BC Managed Care – PPO | Admitting: Family Medicine

## 2022-05-02 ENCOUNTER — Encounter: Payer: Self-pay | Admitting: Family Medicine

## 2022-05-02 VITALS — BP 102/64 | HR 72 | Ht 64.0 in | Wt 124.0 lb

## 2022-05-02 DIAGNOSIS — M9902 Segmental and somatic dysfunction of thoracic region: Secondary | ICD-10-CM

## 2022-05-02 DIAGNOSIS — M9904 Segmental and somatic dysfunction of sacral region: Secondary | ICD-10-CM

## 2022-05-02 DIAGNOSIS — M9903 Segmental and somatic dysfunction of lumbar region: Secondary | ICD-10-CM | POA: Diagnosis not present

## 2022-05-02 DIAGNOSIS — M9908 Segmental and somatic dysfunction of rib cage: Secondary | ICD-10-CM

## 2022-05-02 DIAGNOSIS — M9901 Segmental and somatic dysfunction of cervical region: Secondary | ICD-10-CM | POA: Diagnosis not present

## 2022-05-02 DIAGNOSIS — G4486 Cervicogenic headache: Secondary | ICD-10-CM | POA: Insufficient documentation

## 2022-05-02 DIAGNOSIS — M999 Biomechanical lesion, unspecified: Secondary | ICD-10-CM

## 2022-05-02 MED ORDER — VENLAFAXINE HCL ER 37.5 MG PO CP24: 37.5000 mg | ORAL_CAPSULE | Freq: Every day | ORAL | 0 refills | Status: DC

## 2022-05-02 NOTE — Assessment & Plan Note (Signed)
Patient has had some difficulty with the headache still.  Getting Botox from the neurologist.  Has tried different medications with varying degrees of success.  Gabapentin did help her with the sleep but unfortunately had side effects.  We will start Effexor at a low dose and have patient start decreasing her sertraline.  I believe the patient should do relatively well with this medication but warned of all the side effects and patient encouraged to read the side effect label.  Patient will then follow-up with me again in 4 to 6 weeks.  Attempted osteopathic manipulation to help with some of the discomfort and pain as well.

## 2022-05-02 NOTE — Patient Instructions (Addendum)
Exercises Drop sertaline to 1/2 tab Start Effexor 37.5mg  Spoil yourself See me again in 6 weeks

## 2022-05-02 NOTE — Assessment & Plan Note (Signed)

## 2022-05-09 ENCOUNTER — Other Ambulatory Visit: Payer: Self-pay

## 2022-05-09 ENCOUNTER — Other Ambulatory Visit: Payer: Self-pay | Admitting: Family Medicine

## 2022-05-09 DIAGNOSIS — Z124 Encounter for screening for malignant neoplasm of cervix: Secondary | ICD-10-CM | POA: Diagnosis not present

## 2022-05-09 DIAGNOSIS — Z6821 Body mass index (BMI) 21.0-21.9, adult: Secondary | ICD-10-CM | POA: Diagnosis not present

## 2022-05-09 DIAGNOSIS — Z1231 Encounter for screening mammogram for malignant neoplasm of breast: Secondary | ICD-10-CM | POA: Diagnosis not present

## 2022-05-09 DIAGNOSIS — Z01419 Encounter for gynecological examination (general) (routine) without abnormal findings: Secondary | ICD-10-CM | POA: Diagnosis not present

## 2022-05-09 MED ORDER — VENLAFAXINE HCL ER 37.5 MG PO CP24: 37.5000 mg | ORAL_CAPSULE | Freq: Every day | ORAL | 0 refills | Status: DC

## 2022-05-30 ENCOUNTER — Ambulatory Visit: Payer: BC Managed Care – PPO | Admitting: Family Medicine

## 2022-06-02 ENCOUNTER — Other Ambulatory Visit: Payer: Self-pay | Admitting: Family Medicine

## 2022-06-06 NOTE — Progress Notes (Signed)
[name] [address] [place] [address] Phone: [phone] Subjective:   Bruce Donath, am serving as a scribe for Dr. Antoine Primas.  I'm seeing this patient by the request  of:  Farris Has, MD  CC: Neck and back pain follow-up  [id number]  [name] is a 46 y.o. female coming in with complaint of back and neck pain. OMT 05/02/2022. Patient states that her pain is improving. Trying hard to do the exercises. 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   As well as notes that were available from care everywhere and other healthcare systems.  Reviewed patient's notes for the migraines by neurology and Botox injections  Past medical history, social, surgical and family history all reviewed in electronic medical record.  No pertanent information unless stated regarding to the chief complaint.   Past Medical History:  Diagnosis Date   Anxiety    Celiac sprue    Headache    migraines   TMJ arthralgia     Allergies  Allergen Reactions   Topamax [Topiramate] Other (See Comments)    Couldn't function     Review of Systems:  No  visual changes, nausea, vomiting, diarrhea, constipation, dizziness, abdominal pain, skin rash, fevers, chills, night sweats, weight loss, swollen lymph nodes, body aches, joint swelling, chest pain, shortness of breath, mood changes. POSITIVE muscle aches, headache  Objective  Blood pressure 110/82, pulse 74, height 5\' 4"  (1.626 m), weight 124 lb (56.2 kg), SpO2 98 %.   General: No apparent  distress alert and oriented x3 mood and affect normal, dressed appropriately.  HEENT: Pupils equal, extraocular movements intact  Respiratory: Patient's speak in full sentences and does not appear short of breath  Cardiovascular: No lower extremity edema, non tender, no erythema  MSK:  Back   Osteopathic findings  C3 flexed rotated and side bent right C6 flexed rotated and side bent left T3 extended rotated and side bent right inhaled rib T8 extended rotated and side bent left L3 flexed rotated and side bent right Sacrum right on right       Assessment and Plan:  Cervicogenic headache Doing so much better with headaches since on the effexor we will continue the same dose at this point but do have room to increase if necessary.  Patient is doing much better with this as well as osteopathic manipulation.  Follow-up with me again in 6 to 8 weeks    Nonallopathic problems  Decision today to treat with OMT was based on Physical Exam  After verbal consent patient was treated with HVLA, ME, FPR techniques in cervical, rib, thoracic, lumbar, and sacral  areas  Patient tolerated the procedure well with improvement in symptoms  Patient given exercises, stretches and lifestyle modifications  See medications in patient instructions if given  Patient will follow up in 4-8 weeks     The above documentation has been reviewed and is accurate and complete , DO         Note: This dictation was prepared with Dragon dictation along with smaller phrase technology. Any  transcriptional errors that result from this process are unintentional.

## 2022-06-07 ENCOUNTER — Ambulatory Visit (INDEPENDENT_AMBULATORY_CARE_PROVIDER_SITE_OTHER): Payer: BC Managed Care – PPO | Admitting: Family Medicine

## 2022-06-07 VITALS — BP 110/82 | HR 74 | Ht 64.0 in | Wt 124.0 lb

## 2022-06-07 DIAGNOSIS — M9901 Segmental and somatic dysfunction of cervical region: Secondary | ICD-10-CM | POA: Diagnosis not present

## 2022-06-07 DIAGNOSIS — M9904 Segmental and somatic dysfunction of sacral region: Secondary | ICD-10-CM

## 2022-06-07 DIAGNOSIS — M9902 Segmental and somatic dysfunction of thoracic region: Secondary | ICD-10-CM

## 2022-06-07 DIAGNOSIS — M9908 Segmental and somatic dysfunction of rib cage: Secondary | ICD-10-CM | POA: Diagnosis not present

## 2022-06-07 DIAGNOSIS — G4486 Cervicogenic headache: Secondary | ICD-10-CM

## 2022-06-07 DIAGNOSIS — M9903 Segmental and somatic dysfunction of lumbar region: Secondary | ICD-10-CM | POA: Diagnosis not present

## 2022-06-07 NOTE — Patient Instructions (Signed)
[name] See me in 6-8 weeks

## 2022-06-08 NOTE — Assessment & Plan Note (Signed)
Doing so much better with headaches since on the effexor we will continue the same dose at this point but do have room to increase if necessary.  Patient is doing much better with this as well as osteopathic manipulation.  Follow-up with me again in 6 to 8 weeks

## 2022-06-13 ENCOUNTER — Ambulatory Visit: Payer: BC Managed Care – PPO | Admitting: Family Medicine

## 2022-06-22 ENCOUNTER — Ambulatory Visit: Payer: BC Managed Care – PPO | Admitting: Family Medicine

## 2022-06-27 ENCOUNTER — Ambulatory Visit: Payer: BC Managed Care – PPO | Admitting: Neurology

## 2022-07-02 ENCOUNTER — Encounter: Payer: Self-pay | Admitting: Neurology

## 2022-07-02 DIAGNOSIS — G43711 Chronic migraine without aura, intractable, with status migrainosus: Secondary | ICD-10-CM

## 2022-07-02 MED ORDER — BOTOX 200 UNITS IJ SOLR: INTRAMUSCULAR | 3 refills | Status: DC

## 2022-07-03 ENCOUNTER — Other Ambulatory Visit: Payer: Self-pay | Admitting: Family Medicine

## 2022-07-05 ENCOUNTER — Ambulatory Visit: Payer: BC Managed Care – PPO | Admitting: Family Medicine

## 2022-07-16 DIAGNOSIS — G43711 Chronic migraine without aura, intractable, with status migrainosus: Secondary | ICD-10-CM | POA: Diagnosis not present

## 2022-07-18 ENCOUNTER — Ambulatory Visit (INDEPENDENT_AMBULATORY_CARE_PROVIDER_SITE_OTHER): Payer: BC Managed Care – PPO | Admitting: Neurology

## 2022-07-18 DIAGNOSIS — G43711 Chronic migraine without aura, intractable, with status migrainosus: Secondary | ICD-10-CM | POA: Diagnosis not present

## 2022-07-18 MED ORDER — KETOROLAC TROMETHAMINE 60 MG/2ML IM SOLN
60.0000 mg | Freq: Once | INTRAMUSCULAR | Status: AC
  Administered 2022-07-18: 60 mg via INTRAMUSCULAR

## 2022-07-18 MED ORDER — ONABOTULINUMTOXINA 200 UNITS IJ SOLR
155.0000 [IU] | Freq: Once | INTRAMUSCULAR | Status: AC
  Administered 2022-07-18: 155 [IU] via INTRAMUSCULAR

## 2022-07-18 NOTE — Progress Notes (Unsigned)
[facility] [place] Phone: [phone] Subjective:   [name], am serving as a scribe for Dr. Hulan Saas.  I'm seeing this patient by the request  of:  Abigail Pepper, MD  CC: Back and neck pain follow-up  [id number]  [name] is a 47 y.o. female coming in with complaint of back and neck pain. OMT 06/07/2022. Patient states that she is the same as last visit.  Overall does feel like she is making some improvement.  [name] side effects to the medication.  Still has not completely discontinued the Zoloft [name] side effects to the Effexor.  Medications patient has been prescribed: Effexor  Taking:yes          Reviewed prior external information including notes and imaging from previsou exam, outside providers and external EMR if available.   As well as notes that were available from care everywhere and other healthcare systems.  Past medical history, social, surgical and family history all reviewed in electronic medical record.  [name] pertanent information unless stated regarding to the chief complaint.   Past Medical History:  Diagnosis Date   Anxiety    Celiac sprue    Headache    migraines   TMJ arthralgia     Allergies  Allergen Reactions   Topamax [Topiramate] Other (See Comments)    Couldn't function     Review of Systems:  [name]  visual changes, nausea, vomiting, diarrhea, constipation, dizziness, abdominal pain, skin rash, fevers, chills, night sweats, weight loss, swollen lymph nodes, body aches, joint swelling, chest pain, shortness of breath, mood changes. POSITIVE muscle aches, headache  Objective  Blood pressure 112/78, pulse 71, height 5\' 4"  (1.626 m), weight 126 lb (57.2 kg), SpO2 99 %.   General: [name] apparent distress alert and oriented x3 mood and affect normal, dressed appropriately.  HEENT: Pupils equal, extraocular movements intact  Respiratory: Patient's speak in full sentences  and does not appear short of breath  Cardiovascular: [name] lower extremity edema, non tender, [name] erythema  Neck exam does have some loss of lordosis.  Some tenderness to palpation in the paraspinal musculature.  Osteopathic findings  C2 flexed rotated and side bent right C4 flexed rotated and side bent left T3 extended rotated and side bent right inhaled rib T9 extended rotated and side bent left L2 flexed rotated and side bent right Sacrum right on right       Assessment and Plan:  Cervicalgia Patient is doing significantly better at this point.  Patient feels that the Effexor has been helpful.  Will increase Effexor to 75 mg.  Hopefully this will make even more of a difference and may be completely resolved some of the headaches.  In addition to this we did discuss potentially discontinuing the Zoloft and slowly doing that over the course of time and hoping that the Effexor will help with the underlying other problems.  Follow-up with me again in 6 to 8 weeks    Nonallopathic problems  Decision today to treat with OMT was based on Physical Exam  After verbal consent patient was treated with HVLA, ME, FPR techniques in cervical, rib, thoracic, lumbar, and sacral  areas  Patient tolerated the procedure well with improvement in symptoms  Patient given exercises, stretches and lifestyle modifications  See medications in patient instructions if given  Patient will follow up in 4-8 weeks     The above documentation has been reviewed and is accurate and  complete [name], DO         Note: This dictation was prepared with Dragon dictation along with smaller phrase technology. Any transcriptional errors that result from this process are unintentional.

## 2022-07-18 NOTE — Progress Notes (Signed)
Per Dr. Jaynee Eagles give pt Toradol 60 mg injection . Gave injection .Placed ban dade on injection site . Went back and checked on patient in 10 minutes  Pt states she is feeling good. Dr Jaynee Eagles went to give pt botox injections

## 2022-07-18 NOTE — Progress Notes (Signed)
Botox- 200 units x 1 vial Lot: [id number] Expiration: 10/2024 NDC: [id number]  Bacteriostatic 0.9% Sodium Chloride- 9m total Lot: [id number]: 11/25 NDC: [id number] Dx: [id number]/P

## 2022-07-19 ENCOUNTER — Ambulatory Visit (INDEPENDENT_AMBULATORY_CARE_PROVIDER_SITE_OTHER): Payer: BC Managed Care – PPO | Admitting: Family Medicine

## 2022-07-19 VITALS — BP 112/78 | HR 71 | Ht 64.0 in | Wt 126.0 lb

## 2022-07-19 DIAGNOSIS — M9902 Segmental and somatic dysfunction of thoracic region: Secondary | ICD-10-CM

## 2022-07-19 DIAGNOSIS — M9903 Segmental and somatic dysfunction of lumbar region: Secondary | ICD-10-CM

## 2022-07-19 DIAGNOSIS — M9908 Segmental and somatic dysfunction of rib cage: Secondary | ICD-10-CM | POA: Diagnosis not present

## 2022-07-19 DIAGNOSIS — M9904 Segmental and somatic dysfunction of sacral region: Secondary | ICD-10-CM | POA: Diagnosis not present

## 2022-07-19 DIAGNOSIS — M542 Cervicalgia: Secondary | ICD-10-CM | POA: Diagnosis not present

## 2022-07-19 DIAGNOSIS — M9901 Segmental and somatic dysfunction of cervical region: Secondary | ICD-10-CM

## 2022-07-19 DIAGNOSIS — G4486 Cervicogenic headache: Secondary | ICD-10-CM | POA: Diagnosis not present

## 2022-07-19 MED ORDER — VENLAFAXINE HCL ER 75 MG PO CP24: 75.0000 mg | ORAL_CAPSULE | Freq: Every day | ORAL | 0 refills | Status: DC

## 2022-07-19 NOTE — Patient Instructions (Addendum)
Letter written right Effexor 75 mg Zoloft 1/2 tab for 2 weeks then discontinue See you again in 2 months

## 2022-07-19 NOTE — Assessment & Plan Note (Signed)
Patient is doing significantly better at this point.  Patient feels that the Effexor has been helpful.  Will increase Effexor to 75 mg.  Hopefully this will make even more of a difference and may be completely resolved some of the headaches.  In addition to this we did discuss potentially discontinuing the Zoloft and slowly doing that over the course of time and hoping that the Effexor will help with the underlying other problems.  Follow-up with me again in 6 to 8 weeks

## 2022-07-19 NOTE — Assessment & Plan Note (Signed)
Has responded very well to the Effexor.  Discussed the potential for increasing to 75 mg and discontinuing the Zoloft.  Wrote the titration down in patient's AVS.  Discussed icing regimen and home exercises, discussed which activities to do and which ones to avoid.  Follow-up again 6 to 8 weeks

## 2022-07-22 NOTE — Progress Notes (Signed)
Consent Form 07/22/2022: stable 12/20/2021: Doing great. She has 4 migraine days a month and < 8 total headache days a month 08/16/2021: stable doing great. Start Roselyn Meier  05/11/2021; She is stable, doing fantastic, > 80% improved freqof migraines. She has not reached back out to Dr. Raliegh Ip yet for cervical dystonia injections since she was busy, her oldest is going to app state, she has a 46 year old daughter as well 12/01/2020 Botulism Toxin Injection For Chronic Migraine +a. 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 07/06/2020: Still excellent > 70% improvement in migraine frequency.   04/05/2020: Still excellent > 70% improvemen tin migraine frequency.   Interval history 12/23/2019: Continues excellent response. >70% improvement only 1 mild migraine a month. She saw zach smith at [facility] and feels great and is also having dry needling again.   Interval history 06/04/2019: Continues excellent response, flexeril really helping. Out of 30 days, she has had 1 migraines which is a drastic improvement. >95% decrease in frequency. She has neck pain ordered dry needling which helped. +a. Doing great on the Gralise.  Roselyn Meier did not work. Has only needed to try Nurtec once.      Consent Form Botulism Toxin Injection For Chronic Migraine    Reviewed orally with patient, additionally signature is on file:  Botulism toxin has been approved by the Federal drug administration for treatment of chronic migraine. Botulism toxin does not cure chronic migraine and it may not be effective in some patients.  The administration of botulism toxin is accomplished by injecting a small amount of toxin into the muscles of the neck and head. Dosage must be titrated for  each individual. Any benefits resulting from botulism toxin tend to wear off after 3 months with a repeat injection required if benefit is to be maintained. Injections are usually done every 3-4 months with maximum effect peak achieved by about 2 or 3 weeks. Botulism toxin is expensive and you should be sure of what costs you will incur resulting from the injection.  The side effects of botulism toxin use for chronic migraine may include:   -Transient, and usually mild, facial weakness with facial injections  -Transient, and usually mild, head or neck weakness with head/neck injections  -Reduction or loss of forehead facial animation due to forehead muscle weakness  -Eyelid drooping  -Dry eye  -Pain at the site of injection or bruising at the site of injection  -Double vision  -Potential unknown long term risks  Contraindications: You should not have Botox if you are pregnant, nursing, allergic to albumin, have an infection, skin condition, or muscle weakness at the site of the injection, or have myasthenia gravis, Lambert-Eaton syndrome, or ALS.  It is also possible that as with any injection, there may be an allergic reaction or no effect from the medication. Reduced effectiveness after repeated injections is sometimes seen and rarely infection at the injection site may occur. All care will be taken to prevent these side effects. If therapy is given over a long time, atrophy and wasting in the muscle injected may occur. Occasionally the patient's become refractory to treatment because they develop antibodies to the toxin. In this event, therapy needs to be modified.  I have read the above information and consent to the administration of botulism  toxin.    BOTOX PROCEDURE NOTE FOR MIGRAINE HEADACHE    Contraindications and precautions discussed with patient(above). Aseptic procedure was observed and patient tolerated procedure. Procedure performed by Dr. Georgia Dom  The condition has  existed for more than 6 months, and pt does not have a diagnosis of ALS, Myasthenia Gravis or Lambert-Eaton Syndrome.  Risks and benefits of injections discussed and pt agrees to proceed with the procedure.  Written consent obtained  These injections are medically necessary. Pt  receives good benefits from these injections. These injections do not cause sedations or hallucinations which the oral therapies may cause.  Description of procedure:  The patient was placed in a sitting position. The standard protocol was used for Botox as follows, with 5 units of Botox injected at each site:   -Procerus muscle, midline injection  -Corrugator muscle, bilateral injection  -Frontalis muscle, bilateral injection, with 2 sites each side, medial injection was performed in the upper one third of the frontalis muscle, in the region vertical from the medial inferior edge of the superior orbital rim. The lateral injection was again in the upper one third of the forehead vertically above the lateral limbus of the cornea, 1.5 cm lateral to the medial injection site.  -Temporalis muscle injection, 4 sites, bilaterally. The first injection was 3 cm above the tragus of the ear, second injection site was 1.5 cm to 3 cm up from the first injection site in line with the tragus of the ear. The third injection site was 1.5-3 cm forward between the first 2 injection sites. The fourth injection site was 1.5 cm posterior to the second injection site.   -Occipitalis muscle injection, 3 sites, bilaterally. The first injection was done one half way between the occipital protuberance and the tip of the mastoid process behind the ear. The second injection site was done lateral and superior to the first, 1 fingerbreadth from the first injection. The third injection site was 1 fingerbreadth superiorly and medially from the first injection site.  -Cervical paraspinal muscle injection, 2 sites, bilateral knee first injection site was 1  cm from the midline of the cervical spine, 3 cm inferior to the lower border of the occipital protuberance. The second injection site was 1.5 cm superiorly and laterally to the first injection site.  -Trapezius muscle injection was performed at 3 sites, bilaterally. The first injection site was in the upper trapezius muscle halfway between the inflection point of the neck, and the acromion. The second injection site was one half way between the acromion and the first injection site. The third injection was done between the first injection site and the inflection point of the neck.   Will return for repeat injection in 3 months.   155 units of Botox was used, 45u Botox not injected was wasted. The patient tolerated the procedure well, there were no complications of the above procedure.

## 2022-08-02 ENCOUNTER — Encounter: Payer: Self-pay | Admitting: Family Medicine

## 2022-08-03 ENCOUNTER — Other Ambulatory Visit: Payer: Self-pay

## 2022-08-03 MED ORDER — VENLAFAXINE HCL ER 37.5 MG PO CP24: 37.5000 mg | ORAL_CAPSULE | Freq: Every day | ORAL | 0 refills | Status: DC

## 2022-08-23 DIAGNOSIS — F419 Anxiety disorder, unspecified: Secondary | ICD-10-CM | POA: Diagnosis not present

## 2022-08-23 DIAGNOSIS — K5904 Chronic idiopathic constipation: Secondary | ICD-10-CM | POA: Diagnosis not present

## 2022-08-23 DIAGNOSIS — Z1211 Encounter for screening for malignant neoplasm of colon: Secondary | ICD-10-CM | POA: Diagnosis not present

## 2022-09-12 NOTE — Progress Notes (Unsigned)
[name] [address] [place] Phone: [phone] Subjective:   [name], am serving as a scribe for Dr. Hulan Saas.  I'm seeing this patient by the request  of:  London Pepper, MD  CC: Back and neck pain follow-up  [id number]  [name] is a 47 y.o. female coming in with complaint of back and neck pain. OMT 07/19/2022. Patient states same per usual.  Patient states that unfortunately started to have increasing discomfort again.  Patient discontinued the Effexor recently.  Medications patient has been prescribed: Effexor  Taking: No         Reviewed prior external information including notes and imaging from previsou exam, outside providers and external EMR if available.   As well as notes that were available from care everywhere and other healthcare systems.  Past medical history, social, surgical and family history all reviewed in electronic medical record.  No pertanent information unless stated regarding to the chief complaint.   Past Medical History:  Diagnosis Date   Anxiety    Celiac sprue    Headache    migraines   TMJ arthralgia     Allergies  Allergen Reactions   Topamax [Topiramate] Other (See Comments)    Couldn't function     Review of Systems:  No visual changes, nausea, vomiting, diarrhea, constipation, dizziness, abdominal pain, skin rash, fevers, chills, night sweats, weight loss, swollen lymph nodes, body aches, joint swelling, chest pain, shortness of breath, mood changes. POSITIVE muscle aches, headache  Objective  Blood pressure 120/74, pulse 73, height 5\' 4"  (1.626 m), weight 122 lb (55.3 kg), SpO2 95 %.   General: No apparent distress alert and oriented x3 mood and affect normal, dressed appropriately.  HEENT: Pupils equal, extraocular movements intact  Respiratory: Patient's speak in full sentences and does not appear short of breath  Cardiovascular: No lower extremity  edema, non tender, no erythema    Osteopathic findings  C2 flexed rotated and side bent right C5 flexed rotated and side bent right C6 flexed rotated and side bent left T3 extended rotated and side bent right inhaled rib T9 extended rotated and side bent left L2 flexed rotated and side bent right Sacrum right on right       Assessment and Plan:  Cervicogenic headache Patient was doing better on the Effexor but then started having palpitations.  Discontinued on her own accord.  Started on Cymbalta today to see if this will be helpful.  Discussed which activities to do and which ones to avoid.  Increase activity as tolerated.  Patient will continue to work on the neck exercises.  Responds well to manipulation.  Follow-up with me again in 6 to 8 weeks    Nonallopathic problems  Decision today to treat with OMT was based on Physical Exam  After verbal consent patient was treated with HVLA, ME, FPR techniques in cervical, rib, thoracic, lumbar, and sacral  areas  Patient tolerated the procedure well with improvement in symptoms  Patient given exercises, stretches and lifestyle modifications  See medications in patient instructions if given  Patient will follow up in 4-8 weeks     The above documentation has been reviewed and is accurate and complete [name], DO         Note: This dictation was prepared with Dragon dictation along with smaller phrase technology. Any transcriptional errors that result from this process are unintentional.

## 2022-09-13 ENCOUNTER — Encounter: Payer: Self-pay | Admitting: Family Medicine

## 2022-09-13 ENCOUNTER — Ambulatory Visit (INDEPENDENT_AMBULATORY_CARE_PROVIDER_SITE_OTHER): Payer: BC Managed Care – PPO | Admitting: Family Medicine

## 2022-09-13 VITALS — BP 120/74 | HR 73 | Ht 64.0 in | Wt 122.0 lb

## 2022-09-13 DIAGNOSIS — G4486 Cervicogenic headache: Secondary | ICD-10-CM

## 2022-09-13 DIAGNOSIS — M9908 Segmental and somatic dysfunction of rib cage: Secondary | ICD-10-CM | POA: Diagnosis not present

## 2022-09-13 DIAGNOSIS — M9902 Segmental and somatic dysfunction of thoracic region: Secondary | ICD-10-CM

## 2022-09-13 DIAGNOSIS — M9901 Segmental and somatic dysfunction of cervical region: Secondary | ICD-10-CM | POA: Diagnosis not present

## 2022-09-13 DIAGNOSIS — M9904 Segmental and somatic dysfunction of sacral region: Secondary | ICD-10-CM

## 2022-09-13 DIAGNOSIS — M9903 Segmental and somatic dysfunction of lumbar region: Secondary | ICD-10-CM

## 2022-09-13 MED ORDER — DULOXETINE HCL 20 MG PO CPEP: 20.0000 mg | ORAL_CAPSULE | Freq: Every day | ORAL | 0 refills | Status: DC

## 2022-09-13 NOTE — Assessment & Plan Note (Signed)
Patient was doing better on the Effexor but then started having palpitations.  Discontinued on her own accord.  Started on Cymbalta today to see if this will be helpful.  Discussed which activities to do and which ones to avoid.  Increase activity as tolerated.  Patient will continue to work on the neck exercises.  Responds well to manipulation.  Follow-up with me again in 6 to 8 weeks

## 2022-09-13 NOTE — Patient Instructions (Signed)
Cymbalta 20mg  daily Keep trying to work out See you again in 7-8 weeks

## 2022-10-08 ENCOUNTER — Other Ambulatory Visit: Payer: Self-pay | Admitting: Family Medicine

## 2022-10-09 DIAGNOSIS — G43711 Chronic migraine without aura, intractable, with status migrainosus: Secondary | ICD-10-CM | POA: Diagnosis not present

## 2022-10-18 ENCOUNTER — Ambulatory Visit (INDEPENDENT_AMBULATORY_CARE_PROVIDER_SITE_OTHER): Payer: BC Managed Care – PPO | Admitting: Neurology

## 2022-10-18 DIAGNOSIS — G43711 Chronic migraine without aura, intractable, with status migrainosus: Secondary | ICD-10-CM

## 2022-10-18 MED ORDER — ONABOTULINUMTOXINA 200 UNITS IJ SOLR
155.0000 [IU] | Freq: Once | INTRAMUSCULAR | Status: AC
  Administered 2022-10-18: 155 [IU] via INTRAMUSCULAR

## 2022-10-18 MED ORDER — KETOROLAC TROMETHAMINE 60 MG/2ML IM SOLN
60.0000 mg | Freq: Once | INTRAMUSCULAR | Status: AC
  Administered 2022-10-18: 60 mg via INTRAMUSCULAR

## 2022-10-18 NOTE — Progress Notes (Signed)
Botox- 200 units x 1 vial Lot: [id number] Expiration: 02/2025 NDC: 1478-2956-21  Bacteriostatic 0.9% Sodium Chloride- 4 mL  Lot: [id number] Expiration: 04/2024 NDC: 46962-952-84  Dx: [id number] S/P  Witnessed by Alveria Apley, CMA

## 2022-10-18 NOTE — Progress Notes (Signed)
  Consent Form 07/22/2022: stable 12/20/2021: Doing great. She has 4 migraine days a month and < 8 total headache days a month 08/16/2021: stable doing great. Start Ubrelvy  05/11/2021; She is stable, doing fantastic, > 80% improved freqof migraines. She has not reached back out to Dr. K yet for cervical dystonia injections since she was busy, her oldest is going to app state, she has a 46 year old daughter as well 12/01/2020 Botulism Toxin Injection For Chronic Migraine +a. 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 07/06/2020: Still excellent > 70% improvement in migraine frequency.   04/05/2020: Still excellent > 70% improvemen tin migraine frequency.   Interval history 12/23/2019: Continues excellent response. >70% improvement only 1 mild migraine a month. She saw zach smith at [facility] and feels great and is also having dry needling again.   Interval history 06/04/2019: Continues excellent response, flexeril really helping. Out of 30 days, she has had 1 migraines which is a drastic improvement. >95% decrease in frequency. She has neck pain ordered dry needling which helped. +a. Doing great on the Gralise.  Ubrelvy did not work. Has only needed to try Nurtec once.      Consent Form Botulism Toxin Injection For Chronic Migraine    Reviewed orally with patient, additionally signature is on file:  Botulism toxin has been approved by the Federal drug administration for treatment of chronic migraine. Botulism toxin does not cure chronic migraine and it may not be effective in some patients.  The administration of botulism toxin is accomplished by injecting a small amount of toxin into the muscles of the neck and head. Dosage must be titrated for  each individual. Any benefits resulting from botulism toxin tend to wear off after 3 months with a repeat injection required if benefit is to be maintained. Injections are usually done every 3-4 months with maximum effect peak achieved by about 2 or 3 weeks. Botulism toxin is expensive and you should be sure of what costs you will incur resulting from the injection.  The side effects of botulism toxin use for chronic migraine may include:   -Transient, and usually mild, facial weakness with facial injections  -Transient, and usually mild, head or neck weakness with head/neck injections  -Reduction or loss of forehead facial animation due to forehead muscle weakness  -Eyelid drooping  -Dry eye  -Pain at the site of injection or bruising at the site of injection  -Double vision  -Potential unknown long term risks  Contraindications: You should not have Botox if you are pregnant, nursing, allergic to albumin, have an infection, skin condition, or muscle weakness at the site of the injection, or have myasthenia gravis, Lambert-Eaton syndrome, or ALS.  It is also possible that as with any injection, there may be an allergic reaction or no effect from the medication. Reduced effectiveness after repeated injections is sometimes seen and rarely infection at the injection site may occur. All care will be taken to prevent these side effects. If therapy is given over a long time, atrophy and wasting in the muscle injected may occur. Occasionally the patient's become refractory to treatment because they develop antibodies to the toxin. In this event, therapy needs to be modified.  I have read the above information and consent to the administration of botulism   toxin.    BOTOX PROCEDURE NOTE FOR MIGRAINE HEADACHE    Contraindications and precautions discussed with patient(above). Aseptic procedure was observed and patient tolerated procedure. Procedure performed by Dr. Toni Rashaan Wyles  The condition has  existed for more than 6 months, and pt does not have a diagnosis of ALS, Myasthenia Gravis or Lambert-Eaton Syndrome.  Risks and benefits of injections discussed and pt agrees to proceed with the procedure.  Written consent obtained  These injections are medically necessary. Pt  receives good benefits from these injections. These injections do not cause sedations or hallucinations which the oral therapies may cause.  Description of procedure:  The patient was placed in a sitting position. The standard protocol was used for Botox as follows, with 5 units of Botox injected at each site:   -Procerus muscle, midline injection  -Corrugator muscle, bilateral injection  -Frontalis muscle, bilateral injection, with 2 sites each side, medial injection was performed in the upper one third of the frontalis muscle, in the region vertical from the medial inferior edge of the superior orbital rim. The lateral injection was again in the upper one third of the forehead vertically above the lateral limbus of the cornea, 1.5 cm lateral to the medial injection site.  -Temporalis muscle injection, 4 sites, bilaterally. The first injection was 3 cm above the tragus of the ear, second injection site was 1.5 cm to 3 cm up from the first injection site in line with the tragus of the ear. The third injection site was 1.5-3 cm forward between the first 2 injection sites. The fourth injection site was 1.5 cm posterior to the second injection site.   -Occipitalis muscle injection, 3 sites, bilaterally. The first injection was done one half way between the occipital protuberance and the tip of the mastoid process behind the ear. The second injection site was done lateral and superior to the first, 1 fingerbreadth from the first injection. The third injection site was 1 fingerbreadth superiorly and medially from the first injection site.  -Cervical paraspinal muscle injection, 2 sites, bilateral knee first injection site was 1  cm from the midline of the cervical spine, 3 cm inferior to the lower border of the occipital protuberance. The second injection site was 1.5 cm superiorly and laterally to the first injection site.  -Trapezius muscle injection was performed at 3 sites, bilaterally. The first injection site was in the upper trapezius muscle halfway between the inflection point of the neck, and the acromion. The second injection site was one half way between the acromion and the first injection site. The third injection was done between the first injection site and the inflection point of the neck.   Will return for repeat injection in 3 months.   155 units of Botox was used, 45u Botox not injected was wasted. The patient tolerated the procedure well, there were no complications of the above procedure. 

## 2022-10-18 NOTE — Patient Instructions (Addendum)
Consent Form 10/18/2022: stable doing great as before 07/22/2022: stable 12/20/2021: Doing great. She has 4 migraine days a month and < 8 total headache days a month 08/16/2021: stable doing great. Start Bernita Raisin  05/11/2021; She is stable, doing fantastic, > 80% improved freqof migraines. She has not reached back out to Dr. Kirtland Bouchard yet for cervical dystonia injections since she was busy, her oldest is going to app state, she has a 46 year old daughter as well 12/01/2020 Botulism Toxin Injection For Chronic Migraine +a. 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 07/06/2020: Still excellent > 70% improvement in migraine frequency.   04/05/2020: Still excellent > 70% improvemen tin migraine frequency.   Interval history 12/23/2019: Continues excellent response. >70% improvement only 1 mild migraine a month. She saw zach smith at [facility] and feels great and is also having dry needling again.   Interval history 06/04/2019: Continues excellent response, flexeril really helping. Out of 30 days, she has had 1 migraines which is a drastic improvement. >95% decrease in frequency. She has neck pain ordered dry needling which helped. +a. Doing great on the Gralise.  Bernita Raisin did not work. Has only needed to try Nurtec once.      Consent Form Botulism Toxin Injection For Chronic Migraine    Reviewed orally with patient, additionally signature is on file:  Botulism toxin has been approved by the Federal drug administration for treatment of chronic migraine. Botulism toxin does not cure chronic migraine and it may not be effective in some patients.  The administration of botulism toxin is accomplished by injecting a small amount of toxin into the muscles of the neck  and head. Dosage must be titrated for each individual. Any benefits resulting from botulism toxin tend to wear off after 3 months with a repeat injection required if benefit is to be maintained. Injections are usually done every 3-4 months with maximum effect peak achieved by about 2 or 3 weeks. Botulism toxin is expensive and you should be sure of what costs you will incur resulting from the injection.  The side effects of botulism toxin use for chronic migraine may include:   -Transient, and usually mild, facial weakness with facial injections  -Transient, and usually mild, head or neck weakness with head/neck injections  -Reduction or loss of forehead facial animation due to forehead muscle weakness  -Eyelid drooping  -Dry eye  -Pain at the site of injection or bruising at the site of injection  -Double vision  -Potential unknown long term risks  Contraindications: You should not have Botox if you are pregnant, nursing, allergic to albumin, have an infection, skin condition, or muscle weakness at the site of the injection, or have myasthenia gravis, Lambert-Eaton syndrome, or ALS.  It is also possible that as with any injection, there may be an allergic reaction or no effect from the medication. Reduced effectiveness after repeated injections is sometimes seen and rarely infection at the injection site may occur. All care will be taken to prevent these side effects. If therapy is given over a long time, atrophy and wasting in the muscle injected may occur. Occasionally the patient's become refractory to treatment because they develop antibodies to the toxin. In this event, therapy needs to be modified.  I have read the above information and  consent to the administration of botulism toxin.    BOTOX PROCEDURE NOTE FOR MIGRAINE HEADACHE    Contraindications and precautions discussed with patient(above). Aseptic procedure was observed and patient tolerated procedure. Procedure performed by  Dr. Artemio Aly  The condition has existed for more than 6 months, and pt does not have a diagnosis of ALS, Myasthenia Gravis or Lambert-Eaton Syndrome.  Risks and benefits of injections discussed and pt agrees to proceed with the procedure.  Written consent obtained  These injections are medically necessary. Pt  receives good benefits from these injections. These injections do not cause sedations or hallucinations which the oral therapies may cause.  Description of procedure:  The patient was placed in a sitting position. The standard protocol was used for Botox as follows, with 5 units of Botox injected at each site:   -Procerus muscle, midline injection  -Corrugator muscle, bilateral injection  -Frontalis muscle, bilateral injection, with 2 sites each side, medial injection was performed in the upper one third of the frontalis muscle, in the region vertical from the medial inferior edge of the superior orbital rim. The lateral injection was again in the upper one third of the forehead vertically above the lateral limbus of the cornea, 1.5 cm lateral to the medial injection site.  -Temporalis muscle injection, 4 sites, bilaterally. The first injection was 3 cm above the tragus of the ear, second injection site was 1.5 cm to 3 cm up from the first injection site in line with the tragus of the ear. The third injection site was 1.5-3 cm forward between the first 2 injection sites. The fourth injection site was 1.5 cm posterior to the second injection site.   -Occipitalis muscle injection, 3 sites, bilaterally. The first injection was done one half way between the occipital protuberance and the tip of the mastoid process behind the ear. The second injection site was done lateral and superior to the first, 1 fingerbreadth from the first injection. The third injection site was 1 fingerbreadth superiorly and medially from the first injection site.  -Cervical paraspinal muscle injection, 2 sites,  bilateral knee first injection site was 1 cm from the midline of the cervical spine, 3 cm inferior to the lower border of the occipital protuberance. The second injection site was 1.5 cm superiorly and laterally to the first injection site.  -Trapezius muscle injection was performed at 3 sites, bilaterally. The first injection site was in the upper trapezius muscle halfway between the inflection point of the neck, and the acromion. The second injection site was one half way between the acromion and the first injection site. The third injection was done between the first injection site and the inflection point of the neck.   Will return for repeat injection in 3 months.   155 units of Botox was used, 45u Botox not injected was wasted. The patient tolerated the procedure well, there were no complications of the above procedure.

## 2022-10-18 NOTE — Progress Notes (Signed)
Per Dr.Ahern give pt  of  toradol Gave injection placed bandade on injection site . Pt laid on bed ready for Botox injection

## 2022-10-20 NOTE — Addendum Note (Signed)
Addended by: Naomie Dean B on: 10/20/2022 02:01 PM   Modules accepted: Orders

## 2022-10-31 NOTE — Progress Notes (Signed)
[name] [address] [place] [address] Phone: [phone] Subjective:   Bruce Donath, am serving as a scribe for Dr. Antoine Primas.  I'm seeing this patient by the request  of:  Farris Has, MD  CC: Back and neck pain  BJY:[id number]  [name] is a 47 y.o. female coming in with complaint of back and neck pain. OMT 09/13/2022. Patient states that she is doing well. Continued knot in R scapula. Cymbalta is helpful.   Medications patient has been prescribed: Cymbalta  Taking: Yes         Reviewed prior external information including notes and imaging from previsou exam, outside providers and external EMR if available.   As well as notes that were available from care everywhere and other healthcare systems.  Past medical history, social, surgical and family history all reviewed in electronic medical record.  No pertanent information unless stated regarding to the chief complaint.   Past Medical History:  Diagnosis Date   Anxiety    Celiac sprue    Headache    migraines   TMJ arthralgia     Allergies  Allergen Reactions   Topamax [Topiramate] Other (See Comments)    Couldn't function     Review of Systems:  No headache, visual changes, nausea, vomiting, diarrhea, constipation, dizziness, abdominal pain, skin rash, fevers, chills, night sweats, weight loss, swollen lymph nodes, body aches, joint swelling, chest pain, shortness of breath, mood changes. POSITIVE muscle aches  Objective  Blood pressure 98/64, pulse (!) 105, height 5\' 4"  (1.626 m), weight 120 lb (54.4 kg), SpO2 98 %.   General: No apparent distress alert and oriented x3 mood and affect normal, dressed appropriately.  HEENT: Pupils equal, extraocular movements intact  Respiratory: Patient's speak in full sentences and does not appear short of breath  Cardiovascular: No lower extremity edema, non tender, no erythema  Neck exam does have significant loss  of lordosis noted.  Still tightness noted in the left parascapular area.  The patient does have some limited right-sided of the sidebending of the neck noted.  Low back does have tightness noted especially in the sacroiliac joint.  Osteopathic findings  C3 flexed rotated and side bent right C7 flexed rotated and side bent right T3 extended rotated and side bent left inhaled rib T8 extended rotated and side bent left L2 flexed rotated and side bent right Sacrum right on right       Assessment and Plan:  Cervicogenic headache Patient given increase in the Cymbalta today.  Does respond extremely well to osteopathic manipulation as another modality.  We discussed which activities to do and which ones to avoid.  Increase activity and watch posture throughout.  Discussed other modalities such as a yoga wheel.  Follow-up again in 5 to 6 weeks    Nonallopathic problems  Decision today to treat with OMT was based on Physical Exam  After verbal consent patient was treated with HVLA, ME, FPR techniques in cervical, rib, thoracic, lumbar, and sacral  areas  Patient tolerated the procedure well with improvement in symptoms  Patient given exercises, stretches and lifestyle modifications  See medications in patient instructions if given  Patient will follow up in 4-8 weeks    The above documentation has been reviewed and is accurate and complete Judi Saa, DO          Note: This dictation was prepared with Dragon dictation along with smaller phrase technology. Any transcriptional errors  that result from this process are unintentional.

## 2022-11-01 ENCOUNTER — Encounter: Payer: Self-pay | Admitting: Family Medicine

## 2022-11-01 ENCOUNTER — Ambulatory Visit (INDEPENDENT_AMBULATORY_CARE_PROVIDER_SITE_OTHER): Payer: BC Managed Care – PPO | Admitting: Family Medicine

## 2022-11-01 VITALS — BP 98/64 | HR 105 | Ht 64.0 in | Wt 120.0 lb

## 2022-11-01 DIAGNOSIS — M9901 Segmental and somatic dysfunction of cervical region: Secondary | ICD-10-CM | POA: Diagnosis not present

## 2022-11-01 DIAGNOSIS — M9902 Segmental and somatic dysfunction of thoracic region: Secondary | ICD-10-CM

## 2022-11-01 DIAGNOSIS — M9903 Segmental and somatic dysfunction of lumbar region: Secondary | ICD-10-CM | POA: Diagnosis not present

## 2022-11-01 DIAGNOSIS — M9908 Segmental and somatic dysfunction of rib cage: Secondary | ICD-10-CM

## 2022-11-01 DIAGNOSIS — M9904 Segmental and somatic dysfunction of sacral region: Secondary | ICD-10-CM | POA: Diagnosis not present

## 2022-11-01 DIAGNOSIS — G4486 Cervicogenic headache: Secondary | ICD-10-CM | POA: Diagnosis not present

## 2022-11-01 MED ORDER — DULOXETINE HCL 30 MG PO CPEP: 30.0000 mg | ORAL_CAPSULE | Freq: Every day | ORAL | 1 refills | Status: DC

## 2022-11-01 NOTE — Assessment & Plan Note (Signed)
Patient given increase in the Cymbalta today.  Does respond extremely well to osteopathic manipulation as another modality.  We discussed which activities to do and which ones to avoid.  Increase activity and watch posture throughout.  Discussed other modalities such as a yoga wheel.  Follow-up again in 5 to 6 weeks

## 2022-11-01 NOTE — Patient Instructions (Signed)
Cymbalta 30mg   Yoga wheel See me again in 5-6 weeks

## 2022-11-12 DIAGNOSIS — H5213 Myopia, bilateral: Secondary | ICD-10-CM | POA: Diagnosis not present

## 2022-11-12 DIAGNOSIS — H43813 Vitreous degeneration, bilateral: Secondary | ICD-10-CM | POA: Diagnosis not present

## 2022-11-13 ENCOUNTER — Telehealth: Payer: Self-pay | Admitting: Neurology

## 2022-11-13 NOTE — Telephone Encounter (Signed)
Marcelino Duster called from [facility]. Stated pt needs PA for BOTOX 200 units injection.

## 2022-11-14 ENCOUNTER — Other Ambulatory Visit (HOSPITAL_COMMUNITY): Payer: Self-pay

## 2022-11-15 ENCOUNTER — Other Ambulatory Visit (HOSPITAL_COMMUNITY): Payer: Self-pay

## 2022-11-15 ENCOUNTER — Telehealth: Payer: Self-pay

## 2022-11-15 NOTE — Telephone Encounter (Signed)
    Benefit Verification [id number] Submitted!

## 2022-11-15 NOTE — Telephone Encounter (Signed)
Pharmacy Patient Advocate Encounter   Received notification from [facility] that prior authorization for Botox 200 Units is required/requested.   PA submitted on 11/15/2022 to (ins) [employer]  via [employer] or [facility]) confirmation # [id number] Status is pending

## 2022-11-15 NOTE — Telephone Encounter (Signed)
A new telephone call encounter has been made for this PA Request-please see telephone note dated ...11/15/2022 

## 2022-11-18 ENCOUNTER — Other Ambulatory Visit (HOSPITAL_COMMUNITY): Payer: Self-pay

## 2022-11-18 NOTE — Telephone Encounter (Signed)
Pharmacy Patient Advocate Encounter- Botox BIV-Pharmacy Benefit:  PA was submitted to [facility] [employer]  and has been approved through: 10/17/2023 Authorization# PA Case ID #: [id number]  Please send prescription to Specialty Pharmacy: [facility]: [phone]

## 2022-11-20 ENCOUNTER — Telehealth: Payer: Self-pay | Admitting: Neurology

## 2022-11-20 NOTE — Telephone Encounter (Signed)
Marcelino Duster from [facility] called stating that the pts BOTOX approval for 2023-2024 has been terminated by [facility] on 06/24/22. A new PA has to be submitted and [facility] has to be listed as the dispensing pharmacy and the NPI number [id number] has to be listed as well. Please call [phone] Mon-Fri from 8am-8pm Eastern Time.

## 2022-11-22 ENCOUNTER — Encounter: Payer: Self-pay | Admitting: Family Medicine

## 2022-11-23 ENCOUNTER — Other Ambulatory Visit: Payer: Self-pay | Admitting: Family Medicine

## 2022-11-26 ENCOUNTER — Other Ambulatory Visit: Payer: Self-pay

## 2022-11-26 MED ORDER — DULOXETINE HCL 30 MG PO CPEP: 30.0000 mg | ORAL_CAPSULE | Freq: Every day | ORAL | 0 refills | Status: DC

## 2022-11-26 MED ORDER — DULOXETINE HCL 20 MG PO CPEP: 20.0000 mg | ORAL_CAPSULE | Freq: Every day | ORAL | 0 refills | Status: DC

## 2022-12-10 ENCOUNTER — Ambulatory Visit: Payer: BC Managed Care – PPO | Admitting: Family Medicine

## 2023-01-01 DIAGNOSIS — G43711 Chronic migraine without aura, intractable, with status migrainosus: Secondary | ICD-10-CM | POA: Diagnosis not present

## 2023-01-02 NOTE — Progress Notes (Signed)
[name] [address] [place] [address] Phone: [phone] Subjective:   [name], am serving as a scribe for Dr. Antoine Primas.  I'm seeing this patient by the request  of:  Farris Has, MD  CC: Neck and back pain follow-up  BJY:[id number]  [name] is a 47 y.o. female coming in with complaint of back and neck pain. OMT on 11/01/2022. Patient states that she is here for routine OMT with tightness in her shoulders.  Medications patient has been prescribed: Cymbalta  Taking: Cymbalta         Reviewed prior external information including notes and imaging from previsou exam, outside providers and external EMR if available.   As well as notes that were available from care everywhere and other healthcare systems.  Past medical history, social, surgical and family history all reviewed in electronic medical record.  No pertanent information unless stated regarding to the chief complaint.   Past Medical History:  Diagnosis Date   Anxiety    Celiac sprue    Headache    migraines   TMJ arthralgia     Allergies  Allergen Reactions   Topamax [Topiramate] Other (See Comments)    Couldn't function     Review of Systems:  No , visual changes, nausea, vomiting, diarrhea, constipation, dizziness, abdominal pain, skin rash, fevers, chills, night sweats, weight loss, swollen lymph nodes, body aches, joint swelling, chest pain, shortness of breath, mood changes. POSITIVE muscle aches, headache  Objective  Blood pressure 100/70, pulse 90, height 5\' 4"  (1.626 m), weight 125 lb (56.7 kg), SpO2 98%.   General: No apparent distress alert and oriented x3 mood and affect normal, dressed appropriately.  HEENT: Pupils equal, extraocular movements intact  Respiratory: Patient's speak in full sentences and does not appear short of breath  Cardiovascular: No lower extremity edema, non tender, no erythema  Neck exam continues to give  her some discomfort.  Does have pain to even light palpation.  Patient does have tightness noted in the paraspinal musculature.  Osteopathic findings  C7 flexed rotated and side bent left T3 extended rotated and side bent right inhaled rib T7 extended rotated and side bent left L2 flexed rotated and side bent right Sacrum right on right  After verbal consent patient was prepped with alcohol swab and 3 distinct trigger points in the, trapezius, latissimus dorsi mostly on the right and 1 on the left with a total of 3 cc of 0.5% Marcaine and 1 cc of Kenalog 40 mg/mL used.  Minimal blood loss.  Band-Aids placed.  Postinjection instructions given     Assessment and Plan:  Trigger point of right shoulder region Patient given injections in the shoulder girdle area.  Discussed icing regimen and home exercises.  Discussed which activities to do and which ones to avoid.  Increase activity slowly.  Patient hopefully will do very well with this.  Follow-up again in 6 to 8 weeks  Cervicogenic headache Patient will increase her Cymbalta and see how she responds.  Mom was concerned about increasing previously.  Hopeful that this will make a difference and will start to decrease the sertraline as well.  Follow-up again in 6 to 8 weeks    Nonallopathic problems  Decision today to treat with OMT was based on Physical Exam  After verbal consent patient was treated with HVLA, ME, FPR techniques in cervical, rib, thoracic, lumbar, and sacral  areas  Patient tolerated the procedure well with  improvement in symptoms  Patient given exercises, stretches and lifestyle modifications  See medications in patient instructions if given  Patient will follow up in 4-8 weeks     .[email or web address]         Note: This dictation was prepared with Dragon dictation along with smaller phrase technology. Any transcriptional errors that result from this process are unintentional.

## 2023-01-03 ENCOUNTER — Encounter: Payer: Self-pay | Admitting: Family Medicine

## 2023-01-03 ENCOUNTER — Ambulatory Visit (INDEPENDENT_AMBULATORY_CARE_PROVIDER_SITE_OTHER): Payer: BC Managed Care – PPO | Admitting: Family Medicine

## 2023-01-03 VITALS — BP 100/70 | HR 90 | Ht 64.0 in | Wt 125.0 lb

## 2023-01-03 DIAGNOSIS — M9908 Segmental and somatic dysfunction of rib cage: Secondary | ICD-10-CM | POA: Diagnosis not present

## 2023-01-03 DIAGNOSIS — M25511 Pain in right shoulder: Secondary | ICD-10-CM | POA: Diagnosis not present

## 2023-01-03 DIAGNOSIS — M9901 Segmental and somatic dysfunction of cervical region: Secondary | ICD-10-CM

## 2023-01-03 DIAGNOSIS — M9903 Segmental and somatic dysfunction of lumbar region: Secondary | ICD-10-CM

## 2023-01-03 DIAGNOSIS — M9904 Segmental and somatic dysfunction of sacral region: Secondary | ICD-10-CM | POA: Diagnosis not present

## 2023-01-03 DIAGNOSIS — G4486 Cervicogenic headache: Secondary | ICD-10-CM | POA: Diagnosis not present

## 2023-01-03 DIAGNOSIS — M9902 Segmental and somatic dysfunction of thoracic region: Secondary | ICD-10-CM

## 2023-01-03 MED ORDER — DULOXETINE HCL 30 MG PO CPEP: 30.0000 mg | ORAL_CAPSULE | Freq: Every day | ORAL | 3 refills | Status: DC

## 2023-01-03 NOTE — Assessment & Plan Note (Signed)
Patient given injections in the shoulder girdle area.  Discussed icing regimen and home exercises.  Discussed which activities to do and which ones to avoid.  Increase activity slowly.  Patient hopefully will do very well with this.  Follow-up again in 6 to 8 weeks

## 2023-01-03 NOTE — Assessment & Plan Note (Signed)
Patient will increase her Cymbalta and see how she responds.  Mom was concerned about increasing previously.  Hopeful that this will make a difference and will start to decrease the sertraline as well.  Follow-up again in 6 to 8 weeks

## 2023-01-03 NOTE — Patient Instructions (Addendum)
Good to see you  Cymbalta 30mg  sent in  Trigger point injections in shoulders today Decrease sertraline to half a tab Keep saying active Say hi to the whales  Follow up in 6-8 weeks

## 2023-01-04 ENCOUNTER — Encounter: Payer: Self-pay | Admitting: Family Medicine

## 2023-01-21 ENCOUNTER — Encounter: Payer: Self-pay | Admitting: Neurology

## 2023-01-22 ENCOUNTER — Ambulatory Visit: Payer: BC Managed Care – PPO | Admitting: Family Medicine

## 2023-01-29 ENCOUNTER — Telehealth: Payer: Self-pay | Admitting: Neurology

## 2023-01-29 NOTE — Telephone Encounter (Signed)
LVM and sent mychart msg informing pt of appt change due to MD being out 

## 2023-02-04 ENCOUNTER — Other Ambulatory Visit (HOSPITAL_COMMUNITY): Payer: Self-pay

## 2023-02-04 ENCOUNTER — Ambulatory Visit: Payer: BC Managed Care – PPO | Admitting: Neurology

## 2023-02-04 ENCOUNTER — Telehealth: Payer: Self-pay

## 2023-02-04 NOTE — Telephone Encounter (Signed)
Pharmacy Patient Advocate Encounter   Received notification from CoverMyMeds that prior authorization for Ubrelvy 100MG  tablets is required/requested.   Insurance verification completed.   The patient is insured through [facility] .   Per test claim: PA required; PA started via CoverMyMeds. KEY [id number] . Waiting for clinical questions to populate.

## 2023-02-05 NOTE — Telephone Encounter (Signed)
 Pharmacy Patient Advocate Encounter  Questions generated, answered, and submitted

## 2023-02-07 ENCOUNTER — Other Ambulatory Visit (HOSPITAL_COMMUNITY): Payer: Self-pay

## 2023-02-07 ENCOUNTER — Ambulatory Visit (INDEPENDENT_AMBULATORY_CARE_PROVIDER_SITE_OTHER): Payer: BC Managed Care – PPO | Admitting: Neurology

## 2023-02-07 DIAGNOSIS — G43711 Chronic migraine without aura, intractable, with status migrainosus: Secondary | ICD-10-CM | POA: Diagnosis not present

## 2023-02-07 MED ORDER — ONABOTULINUMTOXINA 200 UNITS IJ SOLR
155.0000 [IU] | Freq: Once | INTRAMUSCULAR | Status: AC
Start: 2023-02-07 — End: 2023-02-07
  Administered 2023-02-07: 155 [IU] via INTRAMUSCULAR

## 2023-02-07 MED ORDER — KETOROLAC TROMETHAMINE 60 MG/2ML IM SOLN
60.0000 mg | Freq: Once | INTRAMUSCULAR | Status: AC
Start: 2023-02-07 — End: 2023-02-07
  Administered 2023-02-07: 60 mg via INTRAMUSCULAR

## 2023-02-07 NOTE — Progress Notes (Signed)
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 ketorolac 60mg  given in the R upper outer quadrant of gluteus maximus. Pt tolerated well and is lying on exam table ready for Botox injection  Toradol 60mg  [id number] Exp: 08/25 NDC:63323-162-03

## 2023-02-07 NOTE — Telephone Encounter (Signed)
Pharmacy Patient Advocate Encounter  Received notification from [facility] that Prior Authorization for Ubrelvy 100MG  tablets has been APPROVED from 02-05-2023 to 02-05-2024. Ran test claim, Copay is $0.00 Quantity approved 16 tablets per 30 days. This test claim was processed through [facility]- copay amounts may vary at other pharmacies due to pharmacy/plan contracts, or as the patient moves through the different stages of their insurance plan.   PA #/Case ID/Reference #: [id number]

## 2023-02-10 NOTE — Patient Instructions (Signed)
Consent Form   02/07/2023: Stable 07/22/2022: stable 12/20/2021: Doing great. She has 4 migraine days a month and < 8 total headache days a month 08/16/2021: stable doing great. Start [name]  05/11/2021; She is stable, doing fantastic, > 80% improved freqof migraines. She has not reached back out to Dr. Kirtland Bouchard yet for cervical dystonia injections since she was busy, her oldest is going to app state, she has a 46 year old daughter as well 12/01/2020 Botulism Toxin Injection For Chronic Migraine +a. 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 07/06/2020: Still excellent > 70% improvement in migraine frequency.   04/05/2020: Still excellent > 70% improvemen tin migraine frequency.   Interval history 12/23/2019: Continues excellent response. >70% improvement only 1 mild migraine a month. She saw zach smith at [facility] and feels great and is also having dry needling again.   Interval history 06/04/2019: Continues excellent response, flexeril really helping. Out of 30 days, she has had 1 migraines which is a drastic improvement. >95% decrease in frequency. She has neck pain ordered dry needling which helped. +a. Doing great on the Gralise.  [name] did not work. Has only needed to try Nurtec once.      Consent Form Botulism Toxin Injection For Chronic Migraine    Reviewed orally with patient, additionally signature is on file:  Botulism toxin has been approved by the Federal drug administration for treatment of chronic migraine. Botulism toxin does not cure chronic migraine and it may not be effective in some patients.  The administration of botulism toxin is accomplished by injecting a small amount of toxin into the muscles of the neck and head. Dosage  must be titrated for each individual. Any benefits resulting from botulism toxin tend to wear off after 3 months with a repeat injection required if benefit is to be maintained. Injections are usually done every 3-4 months with maximum effect peak achieved by about 2 or 3 weeks. Botulism toxin is expensive and you should be sure of what costs you will incur resulting from the injection.  The side effects of botulism toxin use for chronic migraine may include:   -Transient, and usually mild, facial weakness with facial injections  -Transient, and usually mild, head or neck weakness with head/neck injections  -Reduction or loss of forehead facial animation due to forehead muscle weakness  -Eyelid drooping  -Dry eye  -Pain at the site of injection or bruising at the site of injection  -Double vision  -Potential unknown long term risks  Contraindications: You should not have Botox if you are pregnant, nursing, allergic to albumin, have an infection, skin condition, or muscle weakness at the site of the injection, or have myasthenia gravis, Lambert-Eaton syndrome, or ALS.  It is also possible that as with any injection, there may be an allergic reaction or no effect from the medication. Reduced effectiveness after repeated injections is sometimes seen and rarely infection at the injection site may occur. All care will be taken to prevent these side effects. If therapy is given over a long time, atrophy and wasting in the muscle injected may occur. Occasionally the patient's become refractory to treatment because they develop antibodies to the toxin. In this event, therapy needs to be modified.  I have read the above information and consent to  the administration of botulism toxin.    BOTOX PROCEDURE NOTE FOR MIGRAINE HEADACHE    Contraindications and precautions discussed with patient(above). Aseptic procedure was observed and patient tolerated procedure. Procedure performed by Dr. Artemio Aly  The condition has existed for more than 6 months, and pt does not have a diagnosis of ALS, Myasthenia Gravis or Lambert-Eaton Syndrome.  Risks and benefits of injections discussed and pt agrees to proceed with the procedure.  Written consent obtained  These injections are medically necessary. Pt  receives good benefits from these injections. These injections do not cause sedations or hallucinations which the oral therapies may cause.  Description of procedure:  The patient was placed in a sitting position. The standard protocol was used for Botox as follows, with 5 units of Botox injected at each site:   -Procerus muscle, midline injection  -Corrugator muscle, bilateral injection  -Frontalis muscle, bilateral injection, with 2 sites each side, medial injection was performed in the upper one third of the frontalis muscle, in the region vertical from the medial inferior edge of the superior orbital rim. The lateral injection was again in the upper one third of the forehead vertically above the lateral limbus of the cornea, 1.5 cm lateral to the medial injection site.  -Temporalis muscle injection, 4 sites, bilaterally. The first injection was 3 cm above the tragus of the ear, second injection site was 1.5 cm to 3 cm up from the first injection site in line with the tragus of the ear. The third injection site was 1.5-3 cm forward between the first 2 injection sites. The fourth injection site was 1.5 cm posterior to the second injection site.   -Occipitalis muscle injection, 3 sites, bilaterally. The first injection was done one half way between the occipital protuberance and the tip of the mastoid process behind the ear. The second injection site was done lateral and superior to the first, 1 fingerbreadth from the first injection. The third injection site was 1 fingerbreadth superiorly and medially from the first injection site.  -Cervical paraspinal muscle injection, 2 sites, bilateral knee  first injection site was 1 cm from the midline of the cervical spine, 3 cm inferior to the lower border of the occipital protuberance. The second injection site was 1.5 cm superiorly and laterally to the first injection site.  -Trapezius muscle injection was performed at 3 sites, bilaterally. The first injection site was in the upper trapezius muscle halfway between the inflection point of the neck, and the acromion. The second injection site was one half way between the acromion and the first injection site. The third injection was done between the first injection site and the inflection point of the neck.   Will return for repeat injection in 3 months.   155 units of Botox was used, 45u Botox not injected was wasted. The patient tolerated the procedure well, there were no complications of the above procedure.

## 2023-02-13 NOTE — Progress Notes (Addendum)
[name] [address] [place] [address] Phone: [phone] Subjective:   INadine Counts, am serving as a scribe for Dr. Antoine Primas.  I'm seeing this patient by the request  of:  Farris Has, MD  CC: Back and neck pain follow-up  [id number]  [name] is a 47 y.o. female coming in with complaint of back and neck pain. OMT on 01/03/2023.  Patient also had trigger point injections at that time.  Patient did send a message in the next day stating that she was feeling significantly better.  Reviewing patient's chart has been seen by neurology and given Botox injections August 15.  Patient states doing well. Would like trigger point injections. No other concerns.  Medications patient has been prescribed: Cymbalta  Taking:         Reviewed prior external information including notes and imaging from previsou exam, outside providers and external EMR if available.   As well as notes that were available from care everywhere and other healthcare systems.  Past medical history, social, surgical and family history all reviewed in electronic medical record.  No pertanent information unless stated regarding to the chief complaint.   Past Medical History:  Diagnosis Date   Anxiety    Celiac sprue    Headache    migraines   TMJ arthralgia     Allergies  Allergen Reactions   Topamax [Topiramate] Other (See Comments)    Couldn't function     Review of Systems:  No headache, visual changes, nausea, vomiting, diarrhea, constipation, dizziness, abdominal pain, skin rash, fevers, chills, night sweats, weight loss, swollen lymph nodes, body aches, joint swelling, chest pain, shortness of breath, mood changes. POSITIVE muscle aches  Objective  Blood pressure 112/76, pulse 92, height 5\' 4"  (1.626 m), weight 124 lb (56.2 kg), SpO2 97%.   General: No apparent distress alert and oriented x3 mood and affect normal, dressed appropriately.   HEENT: Pupils equal, extraocular movements intact  Respiratory: Patient's speak in full sentences and does not appear short of breath  Cardiovascular: No lower extremity edema, non tender, no erythema  MSK:  Back does have some tightness noted in the paraspinal musculature of the lumbar spine.  Patient has multiple trigger points noted in the right and left shoulder region mostly in the trapezius, rhomboid and latissimus dorsi.  Right sided compared to previous exam does seem to have more improvement than contralateral side.  Osteopathic findings  C2 flexed rotated and side bent right C4 flexed rotated and side bent right C6 flexed rotated and side bent left T4 extended rotated and side bent right inhaled rib T7 extended rotated and side bent left L2 flexed rotated and side bent right Sacrum right on right  After verbal consent patient was prepped with alcohol swabs and with a 25-gauge half inch needle injected in 6 distinct trigger points in the right and left trapezius, rhomboid, and latissimus dorsi.  3 on the left and 3 were done on the right side.  Total of 4 cc of 0.5% Marcaine and  2 cc of Kenalog 40 mg/mL used.  Minimal blood loss.  Band-Aid placed.  Postinjection instructions given.   Assessment and Plan:  Trigger point of right shoulder region Repeat injections given on the right side today.  Tolerated the procedure well.  This time though we did do the left side as well.  Hopeful that I will make significant improvement.  We discussed the cervicogenic headaches and the  patient has been giving Botox for these by another provider.  Hopefully this will help with some of the headaches as well.  We discussed with patient about the medications which she is doing extremely well with the Cymbalta.  Will continue to be active otherwise.  Follow-up with me again 6 to 8 weeks.  Cervicogenic headache As we discussed continue with the Cymbalta.  Doing well.  No change in the amount at the  moment but we will consider it again.  Patient will follow-up again in 6 to 8 weeks.    Nonallopathic problems  Decision today to treat with OMT was based on Physical Exam  After verbal consent patient was treated with HVLA, ME, FPR techniques in cervical, rib, thoracic, lumbar, and sacral  areas  Patient tolerated the procedure well with improvement in symptoms  Patient given exercises, stretches and lifestyle modifications  See medications in patient instructions if given  Patient will follow up in 4-8 weeks    The above documentation has been reviewed and is accurate and complete Judi Saa, DO          Note: This dictation was prepared with Dragon dictation along with smaller phrase technology. Any transcriptional errors that result from this process are unintentional.

## 2023-02-14 ENCOUNTER — Encounter: Payer: Self-pay | Admitting: Family Medicine

## 2023-02-14 ENCOUNTER — Ambulatory Visit (INDEPENDENT_AMBULATORY_CARE_PROVIDER_SITE_OTHER): Payer: BC Managed Care – PPO | Admitting: Family Medicine

## 2023-02-14 VITALS — BP 112/76 | HR 92 | Ht 64.0 in | Wt 124.0 lb

## 2023-02-14 DIAGNOSIS — M25511 Pain in right shoulder: Secondary | ICD-10-CM | POA: Diagnosis not present

## 2023-02-14 DIAGNOSIS — M9904 Segmental and somatic dysfunction of sacral region: Secondary | ICD-10-CM

## 2023-02-14 DIAGNOSIS — M9901 Segmental and somatic dysfunction of cervical region: Secondary | ICD-10-CM

## 2023-02-14 DIAGNOSIS — M9902 Segmental and somatic dysfunction of thoracic region: Secondary | ICD-10-CM | POA: Diagnosis not present

## 2023-02-14 DIAGNOSIS — M25512 Pain in left shoulder: Secondary | ICD-10-CM

## 2023-02-14 DIAGNOSIS — G4486 Cervicogenic headache: Secondary | ICD-10-CM | POA: Diagnosis not present

## 2023-02-14 DIAGNOSIS — M9908 Segmental and somatic dysfunction of rib cage: Secondary | ICD-10-CM

## 2023-02-14 DIAGNOSIS — M9903 Segmental and somatic dysfunction of lumbar region: Secondary | ICD-10-CM

## 2023-02-14 NOTE — Patient Instructions (Addendum)
Trigger point injection today Good to see you! Enjoy the beach See you again in 2-3 months

## 2023-02-14 NOTE — Assessment & Plan Note (Signed)
As we discussed continue with the Cymbalta.  Doing well.  No change in the amount at the moment but we will consider it again.  Patient will follow-up again in 6 to 8 weeks.

## 2023-02-14 NOTE — Assessment & Plan Note (Signed)
Repeat injections given on the right side today.  Tolerated the procedure well.  This time though we did do the left side as well.  Hopeful that I will make significant improvement.  We discussed the cervicogenic headaches and the patient has been giving Botox for these by another provider.  Hopefully this will help with some of the headaches as well.  We discussed with patient about the medications which she is doing extremely well with the Cymbalta.  Will continue to be active otherwise.  Follow-up with me again 6 to 8 weeks.

## 2023-03-27 DIAGNOSIS — B3731 Acute candidiasis of vulva and vagina: Secondary | ICD-10-CM | POA: Diagnosis not present

## 2023-03-27 DIAGNOSIS — N760 Acute vaginitis: Secondary | ICD-10-CM | POA: Diagnosis not present

## 2023-03-27 DIAGNOSIS — Z6821 Body mass index (BMI) 21.0-21.9, adult: Secondary | ICD-10-CM | POA: Diagnosis not present

## 2023-04-03 DIAGNOSIS — Z1211 Encounter for screening for malignant neoplasm of colon: Secondary | ICD-10-CM | POA: Diagnosis not present

## 2023-04-17 DIAGNOSIS — G43711 Chronic migraine without aura, intractable, with status migrainosus: Secondary | ICD-10-CM | POA: Diagnosis not present

## 2023-04-17 NOTE — Progress Notes (Unsigned)
  [name] [address] [place] [address] Phone: [phone] Subjective:   [name], am serving as a scribe for Dr. Antoine Primas.  I'm seeing this patient by the request  of:  Farris Has, MD  CC: back and enck pain follow up   [id number]  [name] is a 47 y.o. female coming in with complaint of back and neck pain. OMT on 02/14/2023. Patient states same per usual. No new concerns.  Medications patient has been prescribed:   Taking:         Reviewed prior external information including notes and imaging from previsou exam, outside providers and external EMR if available.   As well as notes that were available from care everywhere and other healthcare systems.  Past medical history, social, surgical and family history all reviewed in electronic medical record.  No pertanent information unless stated regarding to the chief complaint.   Past Medical History:  Diagnosis Date   Anxiety    Celiac sprue    Headache    migraines   TMJ arthralgia     Allergies  Allergen Reactions   Topamax [Topiramate] Other (See Comments)    Couldn't function     Review of Systems:  No headache, visual changes, nausea, vomiting, diarrhea, constipation, dizziness, abdominal pain, skin rash, fevers, chills, night sweats, weight loss, swollen lymph nodes, body aches, joint swelling, chest pain, shortness of breath, mood changes. POSITIVE muscle aches  Objective  Blood pressure 102/60, pulse 87, height 5\' 4"  (1.626 m), weight 122 lb (55.3 kg), SpO2 97%.   General: No apparent distress alert and oriented x3 mood and affect normal, dressed appropriately.  HEENT: Pupils equal, extraocular movements intact  Respiratory: Patient's speak in full sentences and does not appear short of breath  Cardiovascular: No lower extremity edema, non tender, no erythema  MSK:  Back does have tightness still noted of the parascapular area but no trigger  nodules noted.  Patient does have some limitation in sidebending of the neck bilaterally.  Osteopathic findings  C3 flexed rotated and side bent right C5 flexed rotated and side bent left C7 flexed rotated and side bent left T3 extended rotated and side bent right inhaled rib T6 extended rotated and side bent left L2 flexed rotated and side bent right Sacrum right on right    Assessment and Plan:  Cervicogenic headache Patient no longer is taking the Cymbalta.  Discontinued on her own.  Feels like she is in a good place and is feeling better.  Can do trigger point injections if necessary.  Feeling good at the moment.  Follow-up with me though again in 3 months    Nonallopathic problems  Decision today to treat with OMT was based on Physical Exam  After verbal consent patient was treated with HVLA, ME, FPR techniques in cervical, rib, thoracic, lumbar, and sacral  areas  Patient tolerated the procedure well with improvement in symptoms  Patient given exercises, stretches and lifestyle modifications  See medications in patient instructions if given  Patient will follow up in 4-8 weeks     The above documentation has been reviewed and is accurate and complete Judi Saa, DO         Note: This dictation was prepared with Dragon dictation along with smaller phrase technology. Any transcriptional errors that result from this process are unintentional.

## 2023-04-18 ENCOUNTER — Ambulatory Visit: Payer: BC Managed Care – PPO | Admitting: Family Medicine

## 2023-04-18 ENCOUNTER — Encounter: Payer: Self-pay | Admitting: Family Medicine

## 2023-04-18 VITALS — BP 102/60 | HR 87 | Ht 64.0 in | Wt 122.0 lb

## 2023-04-18 DIAGNOSIS — G4486 Cervicogenic headache: Secondary | ICD-10-CM

## 2023-04-18 DIAGNOSIS — M9904 Segmental and somatic dysfunction of sacral region: Secondary | ICD-10-CM

## 2023-04-18 DIAGNOSIS — M9908 Segmental and somatic dysfunction of rib cage: Secondary | ICD-10-CM | POA: Diagnosis not present

## 2023-04-18 DIAGNOSIS — M9903 Segmental and somatic dysfunction of lumbar region: Secondary | ICD-10-CM | POA: Diagnosis not present

## 2023-04-18 DIAGNOSIS — M9901 Segmental and somatic dysfunction of cervical region: Secondary | ICD-10-CM | POA: Diagnosis not present

## 2023-04-18 DIAGNOSIS — M9902 Segmental and somatic dysfunction of thoracic region: Secondary | ICD-10-CM | POA: Diagnosis not present

## 2023-04-18 NOTE — Assessment & Plan Note (Signed)
Patient no longer is taking the Cymbalta.  Discontinued on her own.  Feels like she is in a good place and is feeling better.  Can do trigger point injections if necessary.  Feeling good at the moment.  Follow-up with me though again in 3 months

## 2023-04-18 NOTE — Patient Instructions (Signed)
Stay active No changes See you again in 2 months

## 2023-05-10 ENCOUNTER — Encounter: Payer: Self-pay | Admitting: Neurology

## 2023-05-14 ENCOUNTER — Ambulatory Visit: Payer: BC Managed Care – PPO | Admitting: Neurology

## 2023-05-15 ENCOUNTER — Ambulatory Visit (INDEPENDENT_AMBULATORY_CARE_PROVIDER_SITE_OTHER): Payer: BC Managed Care – PPO | Admitting: Neurology

## 2023-05-15 ENCOUNTER — Encounter: Payer: Self-pay | Admitting: Neurology

## 2023-05-15 DIAGNOSIS — G43711 Chronic migraine without aura, intractable, with status migrainosus: Secondary | ICD-10-CM

## 2023-05-15 MED ORDER — KETOROLAC TROMETHAMINE 60 MG/2ML IM SOLN
60.0000 mg | Freq: Once | INTRAMUSCULAR | Status: AC
Start: 2023-05-15 — End: 2023-05-15
  Administered 2023-05-15: 60 mg via INTRAMUSCULAR

## 2023-05-15 MED ORDER — ONABOTULINUMTOXINA 200 UNITS IJ SOLR
155.0000 [IU] | Freq: Once | INTRAMUSCULAR | Status: AC
Start: 2023-05-15 — End: 2023-05-15
  Administered 2023-05-15: 155 [IU] via INTRAMUSCULAR

## 2023-05-15 NOTE — Progress Notes (Signed)
Consent Form 05/15/2023: Patienmt feels migraines make her jaw hurt and her jaw hurting makes her migraines worse will inject 5U each masseter to see if helps. Will see me for adhd in a week. Stable, doing great on botox for migraines  02/07/2023: stable 10/18/2022: stable 07/22/2022: stable 12/20/2021: Doing great. She has 4 migraine days a month and < 8 total headache days a month 08/16/2021: stable doing great. Start Bernita Raisin  05/11/2021; She is stable, doing fantastic, > 80% improved freqof migraines. She has not reached back out to Dr. Kirtland Bouchard yet for cervical dystonia injections since she was busy, her oldest is going to app state, she has a 59 year old daughter as well 12/01/2020 Botulism Toxin Injection For Chronic Migraine +a. 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 07/06/2020: Still excellent > 70% improvement in migraine frequency.   04/05/2020: Still excellent > 70% improvemen tin migraine frequency.   Interval history 12/23/2019: Continues excellent response. >70% improvement only 1 mild migraine a month. She saw zach smith at [facility] and feels great and is also having dry needling again.   Interval history 06/04/2019: Continues excellent response, flexeril really helping. Out of 30 days, she has had 1 migraines which is a drastic improvement. >95% decrease in frequency. She has neck pain ordered dry needling which helped. +a. Doing great on the [place].  Bernita Raisin did not work. Has only needed to try Nurtec once.      Consent Form Botulism Toxin Injection For Chronic Migraine    Reviewed orally with patient, additionally signature is on file:  Botulism toxin has been approved by the Federal drug administration for treatment of chronic  migraine. Botulism toxin does not cure chronic migraine and it may not be effective in some patients.  The administration of botulism toxin is accomplished by injecting a small amount of toxin into the muscles of the neck and head. Dosage must be titrated for each individual. Any benefits resulting from botulism toxin tend to wear off after 3 months with a repeat injection required if benefit is to be maintained. Injections are usually done every 3-4 months with maximum effect peak achieved by about 2 or 3 weeks. Botulism toxin is expensive and you should be sure of what costs you will incur resulting from the injection.  The side effects of botulism toxin use for chronic migraine may include:   -Transient, and usually mild, facial weakness with facial injections  -Transient, and usually mild, head or neck weakness with head/neck injections  -Reduction or loss of forehead facial animation due to forehead muscle weakness  -Eyelid drooping  -Dry eye  -Pain at the site of injection or bruising at the site of injection  -Double vision  -Potential unknown long term risks  Contraindications: You should not have Botox if you are pregnant, nursing, allergic to albumin, have an infection, skin condition, or muscle weakness at the site of the injection, or have myasthenia gravis, Lambert-Eaton syndrome, or ALS.  It is also possible that as with any injection, there may be an allergic reaction or no effect from the medication. Reduced effectiveness after repeated injections is sometimes seen and rarely infection at the injection site may occur. All care will be taken to prevent these side effects. If therapy is given over a long time,  atrophy and wasting in the muscle injected may occur. Occasionally the patient's become refractory to treatment because they develop antibodies to the toxin. In this event, therapy needs to be modified.  I have read the above information and consent to the administration of  botulism toxin.    BOTOX PROCEDURE NOTE FOR MIGRAINE HEADACHE    Contraindications and precautions discussed with patient(above). Aseptic procedure was observed and patient tolerated procedure. Procedure performed by Dr. Artemio Aly  The condition has existed for more than 6 months, and pt does not have a diagnosis of ALS, Myasthenia Gravis or Lambert-Eaton Syndrome.  Risks and benefits of injections discussed and pt agrees to proceed with the procedure.  Written consent obtained  These injections are medically necessary. Pt  receives good benefits from these injections. These injections do not cause sedations or hallucinations which the oral therapies may cause.  Description of procedure:  The patient was placed in a sitting position. The standard protocol was used for Botox as follows, with 5 units of Botox injected at each site:   -Procerus muscle, midline injection  -Corrugator muscle, bilateral injection  -Frontalis muscle, bilateral injection, with 2 sites each side, medial injection was performed in the upper one third of the frontalis muscle, in the region vertical from the medial inferior edge of the superior orbital rim. The lateral injection was again in the upper one third of the forehead vertically above the lateral limbus of the cornea, 1.5 cm lateral to the medial injection site.  -Temporalis muscle injection, 4 sites, bilaterally. The first injection was 3 cm above the tragus of the ear, second injection site was 1.5 cm to 3 cm up from the first injection site in line with the tragus of the ear. The third injection site was 1.5-3 cm forward between the first 2 injection sites. The fourth injection site was 1.5 cm posterior to the second injection site.   -Occipitalis muscle injection, 3 sites, bilaterally. The first injection was done one half way between the occipital protuberance and the tip of the mastoid process behind the ear. The second injection site was done lateral  and superior to the first, 1 fingerbreadth from the first injection. The third injection site was 1 fingerbreadth superiorly and medially from the first injection site.  -Cervical paraspinal muscle injection, 2 sites, bilateral knee first injection site was 1 cm from the midline of the cervical spine, 3 cm inferior to the lower border of the occipital protuberance. The second injection site was 1.5 cm superiorly and laterally to the first injection site.  -Trapezius muscle injection was performed at 3 sites, bilaterally. The first injection site was in the upper trapezius muscle halfway between the inflection point of the neck, and the acromion. The second injection site was one half way between the acromion and the first injection site. The third injection was done between the first injection site and the inflection point of the neck.   Will return for repeat injection in 3 months.   155 units of Botox was used, 45u Botox not injected was wasted. The patient tolerated the procedure well, there were no complications of the above procedure.

## 2023-05-15 NOTE — Progress Notes (Signed)
Botox consent signed  Botox- 200 units x 1 vial Lot: [id number] Expiration: 08/2025 NDC: 3244-0102-72  Bacteriostatic 0.9% Sodium Chloride- 4 mL  Lot: [id number] Expiration: 09/24/2023 NDC: 0347-4259-56  Dx: L87.564 S/P  Witnessed by Joellen Jersey RN

## 2023-05-16 ENCOUNTER — Telehealth: Payer: Self-pay | Admitting: Neurology

## 2023-05-16 NOTE — Telephone Encounter (Signed)
LVM and sent mychart msg informing pt of need to reschedule 05/20/23 appt   If pt calls back, we can add her to Dr. Trevor Mace schedule for 05/30/23 at 4pm

## 2023-05-20 ENCOUNTER — Ambulatory Visit: Payer: BC Managed Care – PPO | Admitting: Neurology

## 2023-05-30 ENCOUNTER — Encounter: Payer: Self-pay | Admitting: Neurology

## 2023-05-30 ENCOUNTER — Ambulatory Visit (INDEPENDENT_AMBULATORY_CARE_PROVIDER_SITE_OTHER): Payer: BC Managed Care – PPO | Admitting: Neurology

## 2023-05-30 VITALS — BP 110/70 | HR 68 | Ht 64.0 in | Wt 124.0 lb

## 2023-05-30 DIAGNOSIS — R410 Disorientation, unspecified: Secondary | ICD-10-CM

## 2023-05-30 DIAGNOSIS — R4189 Other symptoms and signs involving cognitive functions and awareness: Secondary | ICD-10-CM | POA: Diagnosis not present

## 2023-05-30 DIAGNOSIS — F902 Attention-deficit hyperactivity disorder, combined type: Secondary | ICD-10-CM | POA: Diagnosis not present

## 2023-05-30 DIAGNOSIS — R413 Other amnesia: Secondary | ICD-10-CM

## 2023-05-30 DIAGNOSIS — F909 Attention-deficit hyperactivity disorder, unspecified type: Secondary | ICD-10-CM | POA: Diagnosis not present

## 2023-05-30 MED ORDER — AMPHETAMINE-DEXTROAMPHETAMINE 10 MG PO TABS
10.0000 mg | ORAL_TABLET | Freq: Two times a day (BID) | ORAL | 0 refills | Status: DC
Start: 2023-05-30 — End: 2023-11-04

## 2023-05-30 NOTE — Progress Notes (Signed)
Provider:  Dr Lucia Gaskins Referring Provider: Farris Has, MD Primary Care Physician:  Farris Has, MD  I initially saw patient in 2018 for migraines.  Since then I have been continuing her Botox with great success.  Today she is here for other problems including possibly ADHD.  Over the years performing Botox she has done great, since 2019 she has had a lot of cervical myofascial neck pain, we have tried her on gray lease and Flexeril which worked, she also sees Dr. Ayesha Mohair for her musculoskeletal pain and she loves them, Flexeril also helped, for her remainder of her headaches Bernita Raisin was prescribed did not seem to work.  She is here to discuss ADHD symptoms she has had for many years.  She is also on Cymbalta which is good for chronic neck pain as well as migraines, Zoloft, and is on the [place] we will ask her today how she is doing on that.  The Botox in the cervical muscles and trapezius during migraine Botox have worked well. Son has ADHD and she notices a lot of similarities.   Patient feels she has been concentration issues for years, possibly as a child she doesn't know, She cannot ,multitask. Getitng worse with age. Easily distracted and difficult to finish tasks. She will forget she sent something to the printer 10 seconds ago. She puts something on the stove and is distracted by something else and she overlooked the pot. She loses things. She cannot watch games, can't sit still for entire kids games, she loses her memory cards, she feels stupid, she forgets words. Organization skills are difficult takes her time to get there. Difficulty remembering appointments/obligations.She procrastinates. She is fidgity when she has to sit. She is impulsive. Can't sit at home. Hyperative. Long conversations bore her and she drifts out. Misplaces things often.Distracted by things like tapping pen and crunching too loud. Very distracted by things and noise around her. She cannot relax on vacation. She  cannot relax and cannot sit she can't.   In 2022 she had an MRI cervical spine:  CLINICAL DATA:  Initial evaluation for chronic neck pain.   EXAM: MRI CERVICAL SPINE WITHOUT CONTRAST   TECHNIQUE: Multiplanar, multisequence MR imaging of the cervical spine was performed. No intravenous contrast was administered.   COMPARISON:  Prior radiograph from 12/30/2020 and MRI from 02/12/2018.   FINDINGS: Alignment: Straightening of the normal cervical lordosis with underlying mild dextroscoliosis. Trace retrolisthesis of C5 on C6.   Vertebrae: Vertebral body height maintained without acute or chronic fracture. Bone marrow signal intensity within normal limits. No discrete or worrisome osseous lesions. No abnormal marrow edema.   Cord: Normal signal morphology.   Posterior Fossa, vertebral arteries, paraspinal tissues: Visualized brain and posterior fossa within normal limits. Craniocervical junction normal. Paraspinous and prevertebral soft tissues within normal limits. Normal intravascular flow voids seen within the vertebral arteries bilaterally.   Disc levels:   C2-C3: Unremarkable.   C3-C4:  Unremarkable.   C4-C5: Minimal disc bulge. No spinal stenosis. Foramina remain patent.   C5-C6: Trace retrolisthesis. Broad posterior disc osteophyte flattens and partially effaces the ventral thecal sac with resultant mild spinal stenosis. No frank cord impingement. Left greater than right uncinate spurring with resultant mild left C6 foraminal narrowing. No significant right foraminal encroachment.   C6-C7:  Unremarkable.   C7-T1:  Unremarkable.   Visualized upper thoracic spine demonstrates no significant finding.   IMPRESSION: 1. Degenerative disc osteophyte at C5-6 with resultant mild canal and left C6 foraminal  stenosis. 2. Minimal noncompressive disc bulging at C4-5 without stenosis.  Patient complains of symptoms per HPI as well as the following symptoms: none .  Pertinent negatives and positives per HPI. All others negative   CC:  Chronic intractable migraines  HPI:  [name] is a 47 y.o. female here as a referral from Dr. Kateri Plummer for migraines. Review of records in the chart: This is a 47 year old patient with chronic migraines. Patient has been following with my colleague here at [facility].  Review of records patient has had migraines since middle school, severe unilateral throbbing painful headaches with nausea, vomiting, photophobia, no auras possibly rarely scintillating scotoma but often not, she didn't seen at the [facility] in the past, chronic left occipital pain and sensitivity, intermittent migratory scalp sensations, muscle cramps and tension in her shoulders, daily headaches, decreased sleep. She has a constant headache on the left side, moderately severe in intensity and waxes and wanes but never disappears completely. On average and 7 out of 10 in pain. She takes ibuprofen and Excedrin when it is severe but no medication overuse no more than 10 times a month. The headaches are on the left side, pounding and throbbing, phonophobia and photophobia, with nausea. Patient was on Accutane.Migraines are in the occipital area, the scalp is very sensitive, was seen at [facility] pain clinic with a trial of mobility, lidocaine injections in the shoulders were helpful as was acupuncture. She has tried to decrease caffeine, increase water and exercise more.   Patient is here for a new appointment, per patient: She is on 50mg  of amitriptylone at night. Headaches are unilateral always on the left, can be behind the eye or occipital, can be dull or throbbing can be severe and ice pick throughout there eye with light and sound sensitivity, nausea, can be severe . She has daily headaches for the last 6 months. And at least 15 are migrainous. A dark room helps. No medication overuse, uses OTC or triptans less than 10 tomes a month. Ongoing for the last 6  months at this frequency. Can be severe. Can vomit. Can last up to 24 hours. No aura. No vision changes. Occipital left-sided pain.   Medications tried include: Topamax (fogginess and drowsiness), prednisone packs(one helped), Rizatriptan (did not help), meloxicam with mild benefit, trigger point injections and shoulders, acupuncture 2. Maxalt., Depakote, Compazine in a migraine cocktail, gabapentin (makes her drowsy and hung over), Imitrex, Cambia, amitriptyline  Reviewed notes, labs and imaging from outside physicians, which showed:  Personally reviewed MRI images of the brain which were normal 10/22/2016.  Review of Systems: Patient complains of symptoms per HPI as well as the following symptoms: no rash, no CP, no SIB Pertinent negatives and positives per HPI. All others negative.   Social History   Socioeconomic History   Marital status: Married    Spouse name: Brett Canales   Number of children: 2   Years of education: 16   Highest education level: Not on file  Occupational History   Occupation: Surveyor, minerals: [employer]  Tobacco Use   Smoking status: Former    Current packs/day: 0.00    Types: Cigarettes    Quit date: 06/28/1999    Years since quitting: 23.9   Smokeless tobacco: Never  Vaping Use   Vaping status: Never Used  Substance and Sexual Activity   Alcohol use: No    Comment: 1-2/year   Drug use: No   Sexual activity: Not  on file  Other Topics Concern   Not on file  Social History Narrative   Married 2 children   Right handed   BSBA   3-4 cups daily, 11/23/16 decreased by 2/3    Social Determinants of Health   Financial Resource Strain: Not on file  Food Insecurity: Not on file  Transportation Needs: Not on file  Physical Activity: Not on file  Stress: Not on file  Social Connections: Unknown (11/07/2021)   Received from [facility], [facility]   Social Network    Social Network: Not on file  Intimate Partner Violence: Unknown  (09/29/2021)   Received from [facility], [facility]   HITS    Physically Hurt: Not on file    Insult or Talk Down To: Not on file    Threaten Physical Harm: Not on file    Scream or Curse: Not on file    Family History  Problem Relation Age of Onset   Hypertension Mother    Diabetes Mother    Celiac disease Mother    Coronary artery disease Maternal Grandfather    Pulmonary fibrosis Father     Past Medical History:  Diagnosis Date   Anxiety    Celiac sprue    Headache    migraines   TMJ arthralgia     Past Surgical History:  Procedure Laterality Date   lymph node removal  2000    Current Outpatient Medications  Medication Sig Dispense Refill   amphetamine-dextroamphetamine (ADDERALL) 10 MG tablet Take 1 tablet (10 mg total) by mouth 2 (two) times daily. 60 tablet 0   BOTOX 200 units injection INJECT 155 UNITS INTO THE MUSCLES OF HEAD AND NECK EVERY 12 WEEKS , BY PRESCRIBER IN OFFICE FOR CHRONIC MIGRAINE ( DISCARD UNUSED PORTION). 1 each 3   hyoscyamine (LEVSIN SL) 0.125 MG SL tablet DISSOLVE 1 TABLET UNDER THE TONGUE EVERY 4 HOURS AS NEEDED FOR ABDOMINAL CRAMPING 60 tablet 3   levonorgestrel (MIRENA) 20 MCG/24HR IUD 1 each by Intrauterine route once.     Multiple Vitamin (MULTIVITAMIN) tablet Take 1 tablet by mouth daily.     sertraline (ZOLOFT) 50 MG tablet Take 1 tablet by mouth at bedtime.     Tenapanor HCl (IBSRELA) 50 MG TABS 1 tab(s) orally 2 times a day for 90 days     Ubrogepant (UBRELVY) 100 MG TABS Take 100 mg by mouth every 2 (two) hours as needed. Maximum 200mg  a day. 16 tablet 11   No current facility-administered medications for this visit.    Allergies as of 05/30/2023 - Review Complete 05/30/2023  Allergen Reaction Noted   Topamax [topiramate] Other (See Comments) 11/23/2016    Vitals: BP 110/70   Pulse 68   Ht 5\' 4"  (1.626 m)   Wt 124 lb (56.2 kg)   BMI 21.28 kg/m  Last Weight:  Wt Readings from Last 1 Encounters:  05/30/23 124 lb (56.2  kg)   Last Height:   Ht Readings from Last 1 Encounters:  05/30/23 5\' 4"  (1.626 m)   Physical exam: Exam: Gen: NAD, conversant, well nourised, well groomed                     CV: RRR, no MRG. No Carotid Bruits. No peripheral edema, warm, nontender Eyes: Conjunctivae clear without exudates or hemorrhage  Neuro: Detailed Neurologic Exam  Speech:    Speech is normal; fluent and spontaneous with normal comprehension.  Cognition:    The patient is oriented  to person, place, and time;     recent and remote memory intact;     language fluent;     normal attention, concentration,     fund of knowledge Cranial Nerves:    The pupils are equal, round, and reactive to light. The fundi are normal and spontaneous venous pulsations are present. Visual fields are full to finger confrontation. Extraocular movements are intact. Trigeminal sensation is intact and the muscles of mastication are normal. The face is symmetric. The palate elevates in the midline. Hearing intact. Voice is normal. Shoulder shrug is normal. The tongue has normal motion without fasciculations.   Coordination:    Normal finger to nose and heel to shin. Normal rapid alternating movements.   Gait:    Heel-toe and tandem gait are normal.   Motor Observation:    No asymmetry, no atrophy, and no involuntary movements noted. Tone:    Normal muscle tone.    Posture:    Posture is normal. normal erect    Strength:    Strength is V/V in the upper and lower limbs.      Sensation: intact to LT     Reflex Exam:  DTR's:    Deep tendon reflexes in the upper and lower extremities are normal bilaterally.   Toes:    The toes are downgoing bilaterally.   Clonus:    Clonus is absent.      Assessment/Plan:  Patient who fits all criteria for ADHD but needs through evaluation: see ADHD screening form  MRI brain  Formal memory testing : Dr. Clayborn Heron in [facility] ADHD medications  Start Adderall : 10mg  by  your bed and take it as sopon as you wake up. Then pat attention when it kicks in and when it starts dropping. If you feels you start feeling worse in the afternoon can take another one at 1-2    Meds ordered this encounter  Medications   amphetamine-dextroamphetamine (ADDERALL) 10 MG tablet    Sig: Take 1 tablet (10 mg total) by mouth 2 (two) times daily.    Dispense:  60 tablet    Refill:  0   Orders Placed This Encounter  Procedures   MR BRAIN W WO CONTRAST   CBC with Differential/Platelets   Comprehensive metabolic panel   TSH Rfx on Abnormal to Free T4   Ambulatory referral to Neuropsychology     Naomie Dean, MD  [facility] [address] [place], [place] [address]  Phone [phone] Fax [phone]  I spent over 45 minutes of face-to-face and non-face-to-face time with patient on the  1. Attention deficit hyperactivity disorder (ADHD), combined type   2. Cognitive decline   3. Short-term memory loss   4. Hyperactivity   5. Confusion    diagnosis.  This included previsit chart review, lab review, study review, order entry, electronic health record documentation, patient education on the different diagnostic and therapeutic options, counseling and coordination of care, risks and benefits of management, compliance, or risk factor reduction

## 2023-05-30 NOTE — Patient Instructions (Addendum)
MRI brain  Formal memory testing : Dr. Clayborn Heron in [facility] ADHD medications  Start Adderall : 10mg  by your bed and take it as sopon as you wake up. Then pat attention when it kicks in and when it starts dropping. If you feels you start feeling worse in the afternoon can take another one at 1-2  Amphetamine; Dextroamphetamine Tablets What is this medication? AMPHETAMINE; DEXTROAMPHETAMINE (am FET a meen; dex troe am FET a meen) treats attention-deficit hyperactivity disorder (ADHD). It works by improving focus and reducing impulsive behavior. It may also be used to treat narcolepsy. It works by promoting wakefulness. It belongs to a group of medications called stimulants. This medicine may be used for other purposes; ask your health care provider or pharmacist if you have questions. COMMON BRAND NAME(S): Adderall What should I tell my care team before I take this medication? They need to know if you have any of these conditions: Anxiety or panic attacks Circulation problems in fingers or toes (Raynaud syndrome) Glaucoma Heart attack Heart disease High blood pressure Kidney disease Liver disease Mental health conditions Seizures Stroke Substance use disorder Suicidal thoughts, plans, or attempt by you or a family member Thyroid disease Tourette syndrome An unusual or allergic reaction to dextroamphetamine, other medications, foods, dyes, or preservatives Pregnant or trying to get pregnant Breastfeeding How should I use this medication? Take this medication by mouth. Take it as directed on the prescription label at the same time every day. You can take it with or without food. If it upsets your stomach, take it with food. Keep taking it unless your care team tells you to stop. A special MedGuide will be given to you by the pharmacist with each prescription and refill. Be sure to read this information carefully each time. Talk to your care team about the use of this  medication in children. While it may be prescribed for children as young as 3 years for selected conditions, precautions do apply. Overdosage: If you think you have taken too much of this medicine contact a poison control center or emergency room at once. NOTE: This medicine is only for you. Do not share this medicine with others. What if I miss a dose? If you miss a dose, take it as soon as you can. If it is almost time for your next dose, take only that dose. Do not take double or extra doses. What may interact with this medication? Do not take this medication with any of the following: Linezolid MAOIs, such as Marplan, Nardil, and Parnate Methylene blue This medication may also interact with the following: Acetazolamide Alcohol Ascorbic acid Certain medications for depression, anxiety, or other mental health conditions Certain medications for migraines, such as sumatriptan Guanethidine Opioids Reserpine Sodium bicarbonate St. John's wort Thiazide diuretics, such as chlorothiazide Tryptophan This list may not describe all possible interactions. Give your health care provider a list of all the medicines, herbs, non-prescription drugs, or dietary supplements you use. Also tell them if you smoke, drink alcohol, or use illegal drugs. Some items may interact with your medicine. What should I watch for while using this medication? Visit your care team for regular checks on your progress. Tell your care team if your symptoms do not start to get better or if they get worse. This medication requires a new prescription from your care team every time it is filled at the pharmacy. This medication can be abused and cause your brain and body to depend on it  after high doses or long term use. Your care team will assess your risk and monitor you closely during treatment. Long term use of this medication may cause your brain and body to depend on it. You may be able to take breaks from this medication  during weekends, holidays, or summer vacations. Talk to your care team about what works for you. If your care team wants you to stop this medication permanently, the dose may be slowly lowered over time to reduce the risk of side effects. Tell your care team if this medication loses its effects, or if you feel you need to take more than the prescribed amount. Do not change your dose without talking to your care team. Do not take this medication close to bedtime. It may prevent you from sleeping. Loss of appetite is common when starting this medication. Eating small, frequent meals or snacks can help. Talk to your care team if appetite loss persists. Children should have height and weight checked often while taking this medication. Tell your care team right away if you notice unexplained wounds on your fingers and toes while taking this medication. You should also tell your care team if you experience numbness or pain, changes in the skin color, or sensitivity to temperature in your fingers or toes. Contact your care team right away if you have an erection that lasts longer than 4 hours or if it becomes painful. This may be a sign of a serious problem and must be treated right away to prevent permanent damage. What side effects may I notice from receiving this medication? Side effects that you should report to your care team as soon as possible: Allergic reactions--skin rash, itching, hives, swelling of the face, lips, tongue, or throat Heart attack--pain or tightness in the chest, shoulders, arms, or jaw, nausea, shortness of breath, cold or clammy skin, feeling faint or lightheaded Heart rhythm changes--fast or irregular heartbeat, dizziness, feeling faint or lightheaded, chest pain, trouble breathing Increase in blood pressure Irritability, confusion, fast or irregular heartbeat, muscle stiffness, twitching muscles, sweating, high fever, seizure, chills, vomiting, diarrhea, which may be signs of  serotonin syndrome Mood and behavior changes--anxiety, nervousness, confusion, hallucinations, irritability, hostility, thoughts of suicide or self-harm, worsening mood, feelings of depression Prolonged or painful erection Raynaud syndrome--cool, numb, or painful fingers or toes that may change color from pale, to blue, to red Seizures Stroke--sudden numbness or weakness of the face, arm, or leg, trouble speaking, confusion, trouble walking, loss of balance or coordination, dizziness, severe headache, change in vision Side effects that usually do not require medical attention (report these to your care team if they continue or are bothersome): Dry mouth Headache Loss of appetite with weight loss Nausea Stomach pain Trouble sleeping This list may not describe all possible side effects. Call your doctor for medical advice about side effects. You may report side effects to FDA at 1-800-FDA-1088. Where should I keep my medication? Keep out of the reach of children and pets. This medication can be abused. Keep it in a safe place to protect it from theft. Do not share it with anyone. It is only for you. Selling or giving away this medication is dangerous and against the law. Store at room temperature between 20 and 25 degrees C (68 and 77 degrees F). Protect from light and moisture. Keep container tightly closed. Get rid of any unused medication after the expiration date. This medication may cause harm and death if it is taken by other adults, children,  or pets. It is important to get rid of the medication as soon as you no longer need it, or it is expired. You can do this in two ways: Take the medication to a medication take-back program. Check with your pharmacy or law enforcement to find a location. If you cannot return the medication, check the label or package insert to see if the medication should be thrown out in the garbage or flushed down the toilet. If you are not sure, ask your care team. If  it is safe to put it in the trash, take the medication out of the container. Mix the medication with cat litter, dirt, coffee grounds, or other unwanted substance. Seal the mixture in a bag or container. Put it in the trash. NOTE: This sheet is a summary. It may not cover all possible information. If you have questions about this medicine, talk to your doctor, pharmacist, or health care provider.  2024 Elsevier/Gold Standard (2022-05-09 00:00:00)

## 2023-06-10 ENCOUNTER — Telehealth: Payer: Self-pay | Admitting: Neurology

## 2023-06-10 NOTE — Telephone Encounter (Addendum)
Referral for neuropsychology fax to [facility]. Phone: [phone], Fax: [phone].Marland Kitchen

## 2023-06-12 NOTE — Progress Notes (Deleted)
  [name] [address] [place] [address] Phone: [phone] Subjective:    I'm seeing this patient by the request  of:  Farris Has, MD  CC:   [id number]  [name] is a 47 y.o. female coming in with complaint of back and neck pain. OMT on 04/18/2023. Patient states   Medications patient has been prescribed:   Taking:         Reviewed prior external information including notes and imaging from previsou exam, outside providers and external EMR if available.   As well as notes that were available from care everywhere and other healthcare systems.  Past medical history, social, surgical and family history all reviewed in electronic medical record.  No pertanent information unless stated regarding to the chief complaint.   Past Medical History:  Diagnosis Date   Anxiety    Celiac sprue    Headache    migraines   TMJ arthralgia     Allergies  Allergen Reactions   Topamax [Topiramate] Other (See Comments)    Couldn't function     Review of Systems:  No headache, visual changes, nausea, vomiting, diarrhea, constipation, dizziness, abdominal pain, skin rash, fevers, chills, night sweats, weight loss, swollen lymph nodes, body aches, joint swelling, chest pain, shortness of breath, mood changes. POSITIVE muscle aches  Objective  There were no vitals taken for this visit.   General: No apparent distress alert and oriented x3 mood and affect normal, dressed appropriately.  HEENT: Pupils equal, extraocular movements intact  Respiratory: Patient's speak in full sentences and does not appear short of breath  Cardiovascular: No lower extremity edema, non tender, no erythema  MSK:  Back   Osteopathic findings  C2 flexed rotated and side bent right C5 flexed rotated and side bent left T3 extended rotated and side bent right inhaled rib T7 extended rotated and side bent left L2 flexed rotated and side bent right Sacrum  right on right     Assessment and Plan:  No problem-specific Assessment & Plan notes found for this encounter.    Nonallopathic problems  Decision today to treat with OMT was based on Physical Exam  After verbal consent patient was treated with HVLA, ME, FPR techniques in cervical, rib, thoracic, lumbar, and sacral  areas  Patient tolerated the procedure well with improvement in symptoms  Patient given exercises, stretches and lifestyle modifications  See medications in patient instructions if given  Patient will follow up in 4-8 weeks    The above documentation has been reviewed and is accurate and complete Judi Saa, DO          Note: This dictation was prepared with Dragon dictation along with smaller phrase technology. Any transcriptional errors that result from this process are unintentional.

## 2023-06-13 ENCOUNTER — Ambulatory Visit: Payer: BC Managed Care – PPO | Admitting: Family Medicine

## 2023-06-24 ENCOUNTER — Telehealth: Payer: Self-pay | Admitting: Neurology

## 2023-06-24 DIAGNOSIS — G43711 Chronic migraine without aura, intractable, with status migrainosus: Secondary | ICD-10-CM

## 2023-06-24 MED ORDER — BOTOX 200 UNITS IJ SOLR: INTRAMUSCULAR | 3 refills | Status: AC

## 2023-06-24 NOTE — Telephone Encounter (Signed)
Refill sent.

## 2023-06-24 NOTE — Telephone Encounter (Signed)
Please send Botox refill to [facility], thank you!

## 2023-06-24 NOTE — Addendum Note (Signed)
Addended by: Danne Harbor on: 06/24/2023 11:38 AM   Modules accepted: Orders

## 2023-07-19 ENCOUNTER — Encounter: Payer: Self-pay | Admitting: Neurology

## 2023-07-19 ENCOUNTER — Other Ambulatory Visit: Payer: Self-pay | Admitting: Family Medicine

## 2023-07-19 MED ORDER — SERTRALINE HCL 50 MG PO TABS: 50.0000 mg | ORAL_TABLET | Freq: Every day | ORAL | 0 refills | Status: AC

## 2023-07-24 DIAGNOSIS — G43909 Migraine, unspecified, not intractable, without status migrainosus: Secondary | ICD-10-CM | POA: Diagnosis not present

## 2023-07-24 DIAGNOSIS — Z13220 Encounter for screening for lipoid disorders: Secondary | ICD-10-CM | POA: Diagnosis not present

## 2023-07-24 DIAGNOSIS — F411 Generalized anxiety disorder: Secondary | ICD-10-CM | POA: Diagnosis not present

## 2023-07-24 DIAGNOSIS — K900 Celiac disease: Secondary | ICD-10-CM | POA: Diagnosis not present

## 2023-08-05 DIAGNOSIS — G43711 Chronic migraine without aura, intractable, with status migrainosus: Secondary | ICD-10-CM | POA: Diagnosis not present

## 2023-08-08 ENCOUNTER — Ambulatory Visit (INDEPENDENT_AMBULATORY_CARE_PROVIDER_SITE_OTHER): Payer: BC Managed Care – PPO | Admitting: Neurology

## 2023-08-08 DIAGNOSIS — G43711 Chronic migraine without aura, intractable, with status migrainosus: Secondary | ICD-10-CM

## 2023-08-08 MED ORDER — ONABOTULINUMTOXINA 200 UNITS IJ SOLR
155.0000 [IU] | Freq: Once | INTRAMUSCULAR | Status: AC
  Administered 2023-08-08: 155 [IU] via INTRAMUSCULAR

## 2023-08-08 MED ORDER — KETOROLAC TROMETHAMINE 60 MG/2ML IM SOLN: 60.0000 mg | Freq: Once | INTRAMUSCULAR | Status: AC

## 2023-08-08 NOTE — Progress Notes (Signed)
Botox- 200 units x 1 vial Lot: [id number] Expiration: 09/2025 NDC: 4782-9562-13  Bacteriostatic 0.9% Sodium Chloride- * mL  Lot: [id number] Expiration: 04/25/2024 NDC: 4696-2952-84  Dx: 43.709 S/P  Witnessed by Alverda Skeans, RN

## 2023-08-08 NOTE — Progress Notes (Signed)
Consent Form 08/08/2023: doing fantastic, > 60% improved freqof migraines.She has 4 migraine days a month and < 8 total headache days a month. SHE GETS TORADOL SHOT BEFORE BOTOX.   05/15/2023: Patienmt feels migraines make her jaw hurt and her jaw hurting makes her migraines worse will inject 5U each masseter to see if helps. Will see me for adhd in a week. Stable, doing great on botox for migraines  02/07/2023: stable 10/18/2022: stable 07/22/2022: stable 12/20/2021: Doing great. She has 4 migraine days a month and < 8 total headache days a month 08/16/2021: stable doing great. Start Bernita Raisin  05/11/2021; She is stable, doing fantastic, > 80% improved freqof migraines. She has not reached back out to Dr. Kirtland Bouchard yet for cervical dystonia injections since she was busy, her oldest is going to app state, she has a 71 year old daughter as well 12/01/2020 Botulism Toxin Injection For Chronic Migraine +a. 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 07/06/2020: Still excellent > 70% improvement in migraine frequency.   04/05/2020: Still excellent > 70% improvemen tin migraine frequency.   Interval history 12/23/2019: Continues excellent response. >70% improvement only 1 mild migraine a month. She saw zach smith at [facility] and feels great and is also having dry needling again.   Interval history 06/04/2019: Continues excellent response, flexeril really helping. Out of 30 days, she has had 1 migraines which is a drastic improvement. >95% decrease in frequency. She has neck pain ordered dry needling which helped. +a. Doing great on the Gralise.  Bernita Raisin did not work. Has only needed to try Nurtec once.      Consent Form Botulism Toxin Injection For Chronic  Migraine    Reviewed orally with patient, additionally signature is on file:  Botulism toxin has been approved by the Federal drug administration for treatment of chronic migraine. Botulism toxin does not cure chronic migraine and it may not be effective in some patients.  The administration of botulism toxin is accomplished by injecting a small amount of toxin into the muscles of the neck and head. Dosage must be titrated for each individual. Any benefits resulting from botulism toxin tend to wear off after 3 months with a repeat injection required if benefit is to be maintained. Injections are usually done every 3-4 months with maximum effect peak achieved by about 2 or 3 weeks. Botulism toxin is expensive and you should be sure of what costs you will incur resulting from the injection.  The side effects of botulism toxin use for chronic migraine may include:   -Transient, and usually mild, facial weakness with facial injections  -Transient, and usually mild, head or neck weakness with head/neck injections  -Reduction or loss of forehead facial animation due to forehead muscle weakness  -Eyelid drooping  -Dry eye  -Pain at the site of injection or bruising at the site of injection  -Double vision  -Potential unknown long term risks  Contraindications: You should not have Botox if you are pregnant, nursing, allergic to albumin, have an infection, skin condition, or muscle weakness at the site of the injection, or have myasthenia gravis, Lambert-Eaton syndrome, or ALS.  It is also possible that as with any injection, there may be an allergic reaction or no effect from the medication. Reduced effectiveness after repeated injections  is sometimes seen and rarely infection at the injection site may occur. All care will be taken to prevent these side effects. If therapy is given over a long time, atrophy and wasting in the muscle injected may occur. Occasionally the patient's become refractory to  treatment because they develop antibodies to the toxin. In this event, therapy needs to be modified.  I have read the above information and consent to the administration of botulism toxin.    BOTOX PROCEDURE NOTE FOR MIGRAINE HEADACHE    Contraindications and precautions discussed with patient(above). Aseptic procedure was observed and patient tolerated procedure. Procedure performed by Dr. Artemio Aly  The condition has existed for more than 6 months, and pt does not have a diagnosis of ALS, Myasthenia Gravis or Lambert-Eaton Syndrome.  Risks and benefits of injections discussed and pt agrees to proceed with the procedure.  Written consent obtained  These injections are medically necessary. Pt  receives good benefits from these injections. These injections do not cause sedations or hallucinations which the oral therapies may cause.  Description of procedure:  The patient was placed in a sitting position. The standard protocol was used for Botox as follows, with 5 units of Botox injected at each site:   -Procerus muscle, midline injection  -Corrugator muscle, bilateral injection  -Frontalis muscle, bilateral injection, with 2 sites each side, medial injection was performed in the upper one third of the frontalis muscle, in the region vertical from the medial inferior edge of the superior orbital rim. The lateral injection was again in the upper one third of the forehead vertically above the lateral limbus of the cornea, 1.5 cm lateral to the medial injection site.  -Temporalis muscle injection, 4 sites, bilaterally. The first injection was 3 cm above the tragus of the ear, second injection site was 1.5 cm to 3 cm up from the first injection site in line with the tragus of the ear. The third injection site was 1.5-3 cm forward between the first 2 injection sites. The fourth injection site was 1.5 cm posterior to the second injection site.   -Occipitalis muscle injection, 3 sites,  bilaterally. The first injection was done one half way between the occipital protuberance and the tip of the mastoid process behind the ear. The second injection site was done lateral and superior to the first, 1 fingerbreadth from the first injection. The third injection site was 1 fingerbreadth superiorly and medially from the first injection site.  -Cervical paraspinal muscle injection, 2 sites, bilateral knee first injection site was 1 cm from the midline of the cervical spine, 3 cm inferior to the lower border of the occipital protuberance. The second injection site was 1.5 cm superiorly and laterally to the first injection site.  -Trapezius muscle injection was performed at 3 sites, bilaterally. The first injection site was in the upper trapezius muscle halfway between the inflection point of the neck, and the acromion. The second injection site was one half way between the acromion and the first injection site. The third injection was done between the first injection site and the inflection point of the neck.   Will return for repeat injection in 3 months.   155 units of Botox was used, 45u Botox not injected was wasted. The patient tolerated the procedure well, there were no complications of the above procedure.

## 2023-08-15 NOTE — Progress Notes (Signed)
 [name] [address] [place] [address] Phone: [phone] Subjective:   [name], am serving as a scribe for Dr. Antoine Primas.  I'm seeing this patient by the request  of:  Farris Has, MD  CC:  Back and neck pain follow-up [id number]  [name] is a 48 y.o. female coming in with complaint of back and neck pain. OMT 04/18/2023. Patient states same per usual. No new symptoms.  Medications patient has been prescribed: None  Taking:         Reviewed prior external information including notes and imaging from previsou exam, outside providers and external EMR if available.   As well as notes that were available from care everywhere and other healthcare systems.  Past medical history, social, surgical and family history all reviewed in electronic medical record.  No pertanent information unless stated regarding to the chief complaint.   Past Medical History:  Diagnosis Date   Anxiety    Celiac sprue    Headache    migraines   TMJ arthralgia     Allergies  Allergen Reactions   Topamax [Topiramate] Other (See Comments)    Couldn't function     Review of Systems:  No headache, visual changes, nausea, vomiting, diarrhea, constipation, dizziness, abdominal pain, skin rash, fevers, chills, night sweats, weight loss, swollen lymph nodes, body aches, joint swelling, chest pain, shortness of breath, mood changes. POSITIVE muscle aches  Objective  Blood pressure 98/64, pulse 88, height 5\' 4"  (1.626 m), weight 124 lb (56.2 kg), SpO2 97%.   General: No apparent distress alert and oriented x3 mood and affect normal, dressed appropriately.  HEENT: Pupils equal, extraocular movements intact  Respiratory: Patient's speak in full sentences and does not appear short of breath  Cardiovascular: No lower extremity edema, non tender, no erythema  Gait MSK:  Back does have significant tightness noted in the parascapular area  with multiple trigger points noted in the trapezius, rhomboid, and subscapularis musculature.  Osteopathic findings C6 flexed rotated and side bent right T3 extended rotated and side bent right inhaled rib T9 extended rotated and side bent right inhaled rib L2 flexed rotated and side bent right Sacrum right on right   After verbal consent patient was prepped with alcohol swab and with a 25-gauge half inch needle injected into the right trapezius, rhomboid, and subscapularis musculature with a total of 3 cc of 0.5% Marcaine and 1 cc of Kenalog 40 mg/mL.  Minimal blood loss.  Band-Aids placed.  Postinjection instructions given.    Assessment and Plan:  Cervicogenic headache Continue to work hard on the Cymbalta as well as the home exercises.  Does have some stress that seems to be giving trouble.  Trigger point injections given as well today.  Discussed working on [employer].  Follow-up again in 6 to 8 weeks.  Trigger point of right shoulder region Repeat injections today, tolerated the procedure well, discussed icing regimen at home exercises, discussed which activities to do and which ones to avoid.  Hopeful that this will make an improvement.  Has had a good response previously.    Nonallopathic problems  Decision today to treat with OMT was based on Physical Exam  After verbal consent patient was treated with HVLA, ME, FPR techniques in cervical, rib, thoracic, lumbar, and sacral  areas  Patient tolerated the procedure well with improvement in symptoms  Patient given exercises, stretches and lifestyle modifications  See medications in patient instructions  if given  Patient will follow up in 4-8 weeks    The above documentation has been reviewed and is accurate and complete Judi Saa, DO          Note: This dictation was prepared with Dragon dictation along with smaller phrase technology. Any transcriptional errors that result from this process are  unintentional.

## 2023-08-19 ENCOUNTER — Ambulatory Visit: Payer: BC Managed Care – PPO | Admitting: Family Medicine

## 2023-08-19 ENCOUNTER — Encounter: Payer: Self-pay | Admitting: Family Medicine

## 2023-08-19 VITALS — BP 98/64 | HR 88 | Ht 64.0 in | Wt 124.0 lb

## 2023-08-19 DIAGNOSIS — M9902 Segmental and somatic dysfunction of thoracic region: Secondary | ICD-10-CM

## 2023-08-19 DIAGNOSIS — M9908 Segmental and somatic dysfunction of rib cage: Secondary | ICD-10-CM

## 2023-08-19 DIAGNOSIS — M9901 Segmental and somatic dysfunction of cervical region: Secondary | ICD-10-CM | POA: Diagnosis not present

## 2023-08-19 DIAGNOSIS — M9904 Segmental and somatic dysfunction of sacral region: Secondary | ICD-10-CM | POA: Diagnosis not present

## 2023-08-19 DIAGNOSIS — G4486 Cervicogenic headache: Secondary | ICD-10-CM

## 2023-08-19 DIAGNOSIS — M25511 Pain in right shoulder: Secondary | ICD-10-CM

## 2023-08-19 DIAGNOSIS — M9903 Segmental and somatic dysfunction of lumbar region: Secondary | ICD-10-CM | POA: Diagnosis not present

## 2023-08-19 NOTE — Assessment & Plan Note (Signed)
 Continue to work hard on the Cymbalta as well as the home exercises.  Does have some stress that seems to be giving trouble.  Trigger point injections given as well today.  Discussed working on [employer] cabin [employer].  Follow-up again in 6 to 8 weeks.

## 2023-08-19 NOTE — Patient Instructions (Signed)
 Trigger point injections today See you again in 5-6 weeks

## 2023-08-19 NOTE — Assessment & Plan Note (Signed)
 Repeat injections today, tolerated the procedure well, discussed icing regimen at home exercises, discussed which activities to do and which ones to avoid.  Hopeful that this will make an improvement.  Has had a good response previously.

## 2023-08-28 DIAGNOSIS — Z01419 Encounter for gynecological examination (general) (routine) without abnormal findings: Secondary | ICD-10-CM | POA: Diagnosis not present

## 2023-08-28 DIAGNOSIS — Z1231 Encounter for screening mammogram for malignant neoplasm of breast: Secondary | ICD-10-CM | POA: Diagnosis not present

## 2023-08-28 DIAGNOSIS — Z30431 Encounter for routine checking of intrauterine contraceptive device: Secondary | ICD-10-CM | POA: Diagnosis not present

## 2023-09-03 ENCOUNTER — Other Ambulatory Visit: Payer: Self-pay | Admitting: Obstetrics and Gynecology

## 2023-09-03 DIAGNOSIS — R928 Other abnormal and inconclusive findings on diagnostic imaging of breast: Secondary | ICD-10-CM

## 2023-09-10 ENCOUNTER — Other Ambulatory Visit: Payer: Self-pay | Admitting: Obstetrics and Gynecology

## 2023-09-10 DIAGNOSIS — R928 Other abnormal and inconclusive findings on diagnostic imaging of breast: Secondary | ICD-10-CM

## 2023-09-19 NOTE — Progress Notes (Signed)
 [name] [address] [place] [address] Phone: [phone] Subjective:   INadine Counts, am serving as a scribe for Dr. Antoine Primas.  I'm seeing this patient by the request  of:  Farris Has, MD  CC: Back and neck pain follow-up  IRJ:[id number]  [name] is a 48 y.o. female coming in with complaint of back and neck pain. OMT 08/19/2023. Patient states doing well. No new symptoms or concerns.  Medications patient has been prescribed: Zoloft  Taking: Yes         Reviewed prior external information including notes and imaging from previsou exam, outside providers and external EMR if available.   As well as notes that were available from care everywhere and other healthcare systems.  Past medical history, social, surgical and family history all reviewed in electronic medical record.  No pertanent information unless stated regarding to the chief complaint.   Past Medical History:  Diagnosis Date   Anxiety    Celiac sprue    Headache    migraines   TMJ arthralgia     Allergies  Allergen Reactions   Topamax [Topiramate] Other (See Comments)    Couldn't function     Review of Systems:  No  visual changes, nausea, vomiting, diarrhea, constipation, dizziness, abdominal pain, skin rash, fevers, chills, night sweats, weight loss, swollen lymph nodes, body aches, joint swelling, chest pain, shortness of breath, mood changes. POSITIVE muscle aches, headaches  Objective  Blood pressure 104/64, pulse 69, height 5\' 4"  (1.626 m), weight 122 lb (55.3 kg), SpO2 97%.   General: No apparent distress alert and oriented x3 mood and affect normal, dressed appropriately.  HEENT: Pupils equal, extraocular movements intact  Respiratory: Patient's speak in full sentences and does not appear short of breath  Cardiovascular: No lower extremity edema, non tender, no erythema  Gait MSK:  Back does have some loss lordosis noted.  Some  tenderness to palpation in the paraspinal musculature.  Some tightness still noted in the right parascapular area of the right side of the neck is worse than the left.  Osteopathic findings  C2 flexed rotated and side bent right C6 flexed rotated and side bent right T3 extended rotated and side bent right inhaled rib T9 extended rotated and side bent left L2 flexed rotated and side bent right L5 flexed rotated and side bent left Sacrum right on right     Assessment and Plan:  Cervicogenic headache Chronic problem, the specialty noted.  Continue to work on Air cabin crew, continue to work on icing regimen and home exercises, increase activity slowly.  Follow-up again in 6 to 8 weeks otherwise follow-up again as stated for manipulation but will continue to monitor otherwise.  Could consider advanced imaging but do not think it would change medical management significantly at the moment.    Nonallopathic problems  Decision today to treat with OMT was based on Physical Exam  After verbal consent patient was treated with HVLA, ME, FPR techniques in cervical, rib, thoracic, lumbar, and sacral  areas  Patient tolerated the procedure well with improvement in symptoms  Patient given exercises, stretches and lifestyle modifications  See medications in patient instructions if given  Patient will follow up in 4-8 weeks    The above documentation has been reviewed and is accurate and complete Judi Saa, DO          Note: This dictation was prepared with Dragon dictation along with smaller phrase  technology. Any transcriptional errors that result from this process are unintentional.

## 2023-09-24 ENCOUNTER — Encounter: Payer: Self-pay | Admitting: Family Medicine

## 2023-09-24 ENCOUNTER — Ambulatory Visit (INDEPENDENT_AMBULATORY_CARE_PROVIDER_SITE_OTHER): Payer: BC Managed Care – PPO | Admitting: Family Medicine

## 2023-09-24 VITALS — BP 104/64 | HR 69 | Ht 64.0 in | Wt 122.0 lb

## 2023-09-24 DIAGNOSIS — M9904 Segmental and somatic dysfunction of sacral region: Secondary | ICD-10-CM

## 2023-09-24 DIAGNOSIS — M9901 Segmental and somatic dysfunction of cervical region: Secondary | ICD-10-CM

## 2023-09-24 DIAGNOSIS — M9902 Segmental and somatic dysfunction of thoracic region: Secondary | ICD-10-CM

## 2023-09-24 DIAGNOSIS — M9903 Segmental and somatic dysfunction of lumbar region: Secondary | ICD-10-CM | POA: Diagnosis not present

## 2023-09-24 DIAGNOSIS — G4486 Cervicogenic headache: Secondary | ICD-10-CM | POA: Diagnosis not present

## 2023-09-24 DIAGNOSIS — M9908 Segmental and somatic dysfunction of rib cage: Secondary | ICD-10-CM | POA: Diagnosis not present

## 2023-09-24 NOTE — Assessment & Plan Note (Signed)
 Chronic problem, the specialty noted.  Continue to work on Air cabin crew, continue to work on icing regimen and home exercises, increase activity slowly.  Follow-up again in 6 to 8 weeks otherwise follow-up again as stated for manipulation but will continue to monitor otherwise.  Could consider advanced imaging but do not think it would change medical management significantly at the moment.

## 2023-09-24 NOTE — Patient Instructions (Signed)
 Good to see you! Have fun at concert See you again in 2 months Keep hands within peripheral vision with trainer

## 2023-09-25 ENCOUNTER — Ambulatory Visit
Admission: RE | Admit: 2023-09-25 | Discharge: 2023-09-25 | Disposition: A | Discharge status: Home / Self Care | Source: Ambulatory Visit | Attending: Obstetrics and Gynecology | Admitting: Obstetrics and Gynecology

## 2023-09-25 DIAGNOSIS — R928 Other abnormal and inconclusive findings on diagnostic imaging of breast: Secondary | ICD-10-CM

## 2023-09-25 DIAGNOSIS — N6001 Solitary cyst of right breast: Secondary | ICD-10-CM | POA: Diagnosis not present

## 2023-09-25 DIAGNOSIS — N644 Mastodynia: Secondary | ICD-10-CM | POA: Diagnosis not present

## 2023-09-25 DIAGNOSIS — N6312 Unspecified lump in the right breast, upper inner quadrant: Secondary | ICD-10-CM | POA: Diagnosis not present

## 2023-10-02 ENCOUNTER — Telehealth: Payer: Self-pay | Admitting: Neurology

## 2023-10-02 NOTE — Telephone Encounter (Signed)
 [facility]/ Helmut Muster regarding Botox PA to see if received fax sent on 09/26/23. If going to get PA sent to the insurance plan

## 2023-10-02 NOTE — Telephone Encounter (Signed)
 Lawanna Kobus, does Valli Glance have this in her box? We can send it to [facility] to work on and forward this phone note to her. The PA team fax # is [phone].

## 2023-10-02 NOTE — Telephone Encounter (Signed)
 I called [facility] this morning at 10:21 am and spoke with Beth C. I told her that we do not have the fax. She is having it resent to our fax # [phone].

## 2023-10-03 NOTE — Telephone Encounter (Signed)
 Completed [employer] PA form and faxed with OV notes to [phone].

## 2023-10-07 NOTE — Telephone Encounter (Signed)
 Received fax of approval from [facility], relayed info to [facility]. Auth#: [id number] (10/03/23-09/03/24)

## 2023-10-28 DIAGNOSIS — G43711 Chronic migraine without aura, intractable, with status migrainosus: Secondary | ICD-10-CM | POA: Diagnosis not present

## 2023-11-04 ENCOUNTER — Ambulatory Visit (INDEPENDENT_AMBULATORY_CARE_PROVIDER_SITE_OTHER): Payer: BC Managed Care – PPO | Admitting: Neurology

## 2023-11-04 DIAGNOSIS — G43711 Chronic migraine without aura, intractable, with status migrainosus: Secondary | ICD-10-CM | POA: Diagnosis not present

## 2023-11-04 DIAGNOSIS — G43009 Migraine without aura, not intractable, without status migrainosus: Secondary | ICD-10-CM

## 2023-11-04 MED ORDER — ONABOTULINUMTOXINA 200 UNITS IJ SOLR
155.0000 [IU] | Freq: Once | INTRAMUSCULAR | Status: AC
Start: 2023-11-04 — End: 2023-11-04
  Administered 2023-11-04: 155 [IU] via INTRAMUSCULAR

## 2023-11-04 MED ORDER — UBRELVY 100 MG PO TABS: 100.0000 mg | ORAL_TABLET | ORAL | 11 refills | Status: AC | PRN

## 2023-11-04 MED ORDER — KETOROLAC TROMETHAMINE 60 MG/2ML IM SOLN
60.0000 mg | Freq: Once | INTRAMUSCULAR | Status: AC
  Administered 2023-11-04: 60 mg via INTRAMUSCULAR

## 2023-11-04 NOTE — Progress Notes (Signed)
 Botox - 200 units x 1 vial Lot: [id number] Expiration: 02/2026 NDC: 5784-6962-95  Bacteriostatic 0.9% Sodium Chloride - 5 mL  Lot: [id number] Expiration: 04/25/2024 NDC: 4401-0272-53  Dx: G64.403 S/P  Witnessed by Clem Currier, NP

## 2023-11-04 NOTE — Progress Notes (Signed)
 Verbal order for toradol  60mg  IM per Dr. Tresia Fruit.  Under aseptic technique toradol  60mg  IM given to R upper outer gluteal. Bandaid applied.  Pt tolerated well.

## 2023-11-04 NOTE — Progress Notes (Signed)
 Consent Form  48/05/2024: doing fantastic, > 60% improved freqof migraines.She has 4 migraine days a month and < 8 total headache days a month. SHE GETS TORADOL  SHOT BEFORE BOTOX . Prescribe/refill Ubrelvy (helps tremendously when used acutely within 1 hour). Tried frovatriptan , sumatriptan, rizatriptan   Meds ordered this encounter  Medications   botulinum toxin Type A  (BOTOX ) injection 155 Units    Botox - 200 units x 1 vial Lot: [id number] Expiration: 02/2026 NDC: 6045-4098-11  Bacteriostatic 0.9% Sodium Chloride - 5 mL  Lot: [id number] Expiration: 04/25/2024 NDC: 9562-1308-65  Dx: G43.711 S/P   ketorolac  (TORADOL ) injection 60 mg   Ubrogepant  (UBRELVY ) 100 MG TABS    Sig: Take 1 tablet (100 mg total) by mouth every 2 (two) hours as needed. Maximum 200mg  a day. Please run copay card: BIN [id number] PCN CNRX GRP [id number] ID [id number] EXP 06/24/2024    Dispense:  16 tablet    Refill:  11    Please run copay card: BIN [id number] PCN CNRX GRP [id number] ID [id number] EXP 06/24/2024     47/13/2025: doing fantastic, > 60% improved freqof migraines.She has 4 migraine days a month and < 8 total headache days a month. SHE GETS TORADOL  SHOT BEFORE BOTOX .   47/20/2024: Patienmt feels migraines make her jaw hurt and her jaw hurting makes her migraines worse will inject 5U each masseter to see if helps. Will see me for adhd in a week. Stable, doing great on botox  for migraines  02/07/2023: stable 10/18/2022: stable 07/22/2022: stable 12/20/2021: Doing great. She has 4 migraine days a month and < 8 total headache days a month 08/16/2021: stable doing great. Start Ubrelvy   05/11/2021; She is stable, doing fantastic, > 80% improved freqof migraines. She has not reached back out to Dr. Linnell Richardson yet for cervical dystonia injections since she was busy, her oldest is going to app state, she has a 26 year old daughter as well 12/01/2020 Botulism Toxin Injection For Chronic Migraine +a. 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 07/06/2020: Still excellent > 70% improvement in migraine frequency.   04/05/2020: Still excellent > 70% improvemen tin migraine frequency.   Interval history 12/23/2019: Continues excellent response. >70% improvement only 1 mild migraine a month. She saw zach smith at [facility] and feels great and is also having dry needling again.   Interval history 06/04/2019: Continues excellent response, flexeril  really helping. Out of 30 days, she has had 1 migraines which is a drastic improvement. >95% decrease in frequency. She has neck pain ordered dry needling which helped. +a. Doing great on the Gralise .  Ubrelvy  did not work. Has only needed to try Nurtec once.      Consent Form Botulism Toxin Injection For Chronic Migraine    Reviewed orally with patient, additionally signature is on file:  Botulism toxin has been approved by the Federal drug administration for treatment of chronic migraine. Botulism toxin does not cure chronic migraine and it may not be effective in some patients.  The administration of botulism toxin is accomplished by injecting a small amount of toxin into the muscles of the neck and head. Dosage must be titrated for each individual. Any benefits resulting from botulism toxin tend to wear off after 3 months with a repeat injection required if benefit is to be maintained. Injections are usually  done every 3-4 months with maximum effect peak achieved by about 2 or 3 weeks. Botulism toxin is expensive and you should be sure of what costs you will incur resulting from the injection.  The side effects of botulism toxin use for chronic migraine may include:   -Transient, and usually mild, facial weakness with facial  injections  -Transient, and usually mild, head or neck weakness with head/neck injections  -Reduction or loss of forehead facial animation due to forehead muscle weakness  -Eyelid drooping  -Dry eye  -Pain at the site of injection or bruising at the site of injection  -Double vision  -Potential unknown long term risks  Contraindications: You should not have Botox  if you are pregnant, nursing, allergic to albumin, have an infection, skin condition, or muscle weakness at the site of the injection, or have myasthenia gravis, Lambert-Eaton syndrome, or ALS.  It is also possible that as with any injection, there may be an allergic reaction or no effect from the medication. Reduced effectiveness after repeated injections is sometimes seen and rarely infection at the injection site may occur. All care will be taken to prevent these side effects. If therapy is given over a long time, atrophy and wasting in the muscle injected may occur. Occasionally the patient's become refractory to treatment because they develop antibodies to the toxin. In this event, therapy needs to be modified.  I have read the above information and consent to the administration of botulism toxin.    BOTOX  PROCEDURE NOTE FOR MIGRAINE HEADACHE    Contraindications and precautions discussed with patient(above). Aseptic procedure was observed and patient tolerated procedure. Procedure performed by Dr. Criselda Dolly  The condition has existed for more than 6 months, and pt does not have a diagnosis of ALS, Myasthenia Gravis or Lambert-Eaton Syndrome.  Risks and benefits of injections discussed and pt agrees to proceed with the procedure.  Written consent obtained  These injections are medically necessary. Pt  receives good benefits from these injections. These injections do not cause sedations or hallucinations which the oral therapies may cause.  Description of procedure:  The patient was placed in a sitting position. The  standard protocol was used for Botox  as follows, with 5 units of Botox  injected at each site:   -Procerus muscle, midline injection  -Corrugator muscle, bilateral injection  -Frontalis muscle, bilateral injection, with 2 sites each side, medial injection was performed in the upper one third of the frontalis muscle, in the region vertical from the medial inferior edge of the superior orbital rim. The lateral injection was again in the upper one third of the forehead vertically above the lateral limbus of the cornea, 1.5 cm lateral to the medial injection site.  -Temporalis muscle injection, 4 sites, bilaterally. The first injection was 3 cm above the tragus of the ear, second injection site was 1.5 cm to 3 cm up from the first injection site in line with the tragus of the ear. The third injection site was 1.5-3 cm forward between the first 2 injection sites. The fourth injection site was 1.5 cm posterior to the second injection site.   -Occipitalis muscle injection, 3 sites, bilaterally. The first injection was done one half way between the occipital protuberance and the tip of the mastoid process behind the ear. The second injection site was done lateral and superior to the first, 1 fingerbreadth from the first injection. The third injection site was 1 fingerbreadth superiorly and medially from the first injection site.  -Cervical paraspinal  muscle injection, 2 sites, bilateral knee first injection site was 1 cm from the midline of the cervical spine, 3 cm inferior to the lower border of the occipital protuberance. The second injection site was 1.5 cm superiorly and laterally to the first injection site.  -Trapezius muscle injection was performed at 3 sites, bilaterally. The first injection site was in the upper trapezius muscle halfway between the inflection point of the neck, and the acromion. The second injection site was one half way between the acromion and the first injection site. The third  injection was done between the first injection site and the inflection point of the neck.   Will return for repeat injection in 3 months.   155 units of Botox  was used, 45u Botox  not injected was wasted. The patient tolerated the procedure well, there were no complications of the above procedure.

## 2023-11-08 ENCOUNTER — Encounter: Payer: Self-pay | Admitting: Family Medicine

## 2023-11-13 ENCOUNTER — Encounter: Payer: Self-pay | Admitting: Neurology

## 2023-11-13 DIAGNOSIS — H5213 Myopia, bilateral: Secondary | ICD-10-CM | POA: Diagnosis not present

## 2023-11-13 DIAGNOSIS — H31003 Unspecified chorioretinal scars, bilateral: Secondary | ICD-10-CM | POA: Diagnosis not present

## 2023-11-22 NOTE — Progress Notes (Deleted)
  [facility] [address] [place] [address] Phone: [phone] Subjective:    I'm seeing this patient by the request  of:  Ronna Coho, MD  CC: back and neck pain follow up   [id number]  [name] is a 48 y.o. female coming in with complaint of back and neck pain. OMT 09/24/2023. Patient states   Medications patient has been prescribed: Zoloft   Taking:         Reviewed prior external information including notes and imaging from previsou exam, outside providers and external EMR if available.   As well as notes that were available from care everywhere and other healthcare systems.  Past medical history, social, surgical and family history all reviewed in electronic medical record.  No pertanent information unless stated regarding to the chief complaint.   Past Medical History:  Diagnosis Date   Anxiety    Celiac sprue    Headache    migraines   TMJ arthralgia     Allergies  Allergen Reactions   Topamax  [Topiramate ] Other (See Comments)    Couldn't function     Review of Systems:  No  visual changes, nausea, vomiting, diarrhea, constipation, dizziness, abdominal pain, skin rash, fevers, chills, night sweats, weight loss, swollen lymph nodes, body aches, joint swelling, chest pain, shortness of breath, mood changes. POSITIVE muscle aches, headache   Objective  There were no vitals taken for this visit.   General: No apparent distress alert and oriented x3 mood and affect normal, dressed appropriately.  HEENT: Pupils equal, extraocular movements intact  Respiratory: Patient's speak in full sentences and does not appear short of breath  Cardiovascular: No lower extremity edema, non tender, no erythema  MSK:  Back   Osteopathic findings  C3 flexed rotated and side bent right C7 flexed rotated and side bent left T3 extended rotated and side bent right inhaled rib T7 extended rotated and side bent left L2 flexed  rotated and side bent right Sacrum right on right    Assessment and Plan:  No problem-specific Assessment & Plan notes found for this encounter.    Nonallopathic problems  Decision today to treat with OMT was based on Physical Exam  After verbal consent patient was treated with HVLA, ME, FPR techniques in cervical, rib, thoracic, lumbar, and sacral  areas  Patient tolerated the procedure well with improvement in symptoms  Patient given exercises, stretches and lifestyle modifications  See medications in patient instructions if given  Patient will follow up in 4-8 weeks    The above documentation has been reviewed and is accurate and complete Fronia Depass M Kearston Putman, DO          Note: This dictation was prepared with Dragon dictation along with smaller phrase technology. Any transcriptional errors that result from this process are unintentional.

## 2023-11-25 ENCOUNTER — Ambulatory Visit: Admitting: Family Medicine

## 2023-12-12 DIAGNOSIS — K900 Celiac disease: Secondary | ICD-10-CM | POA: Diagnosis not present

## 2023-12-12 DIAGNOSIS — F419 Anxiety disorder, unspecified: Secondary | ICD-10-CM | POA: Diagnosis not present

## 2023-12-12 DIAGNOSIS — Z1211 Encounter for screening for malignant neoplasm of colon: Secondary | ICD-10-CM | POA: Diagnosis not present

## 2023-12-12 DIAGNOSIS — R194 Change in bowel habit: Secondary | ICD-10-CM | POA: Diagnosis not present

## 2023-12-16 DIAGNOSIS — R194 Change in bowel habit: Secondary | ICD-10-CM | POA: Diagnosis not present

## 2023-12-16 DIAGNOSIS — K900 Celiac disease: Secondary | ICD-10-CM | POA: Diagnosis not present

## 2023-12-20 NOTE — Progress Notes (Unsigned)
 [name] [address] [place] [address] Phone: [phone] Subjective:   [name], am serving as a scribe for Dr. Arthea [name].  I'm seeing this patient by the request  of:  Kip Righter, MD  CC: neck and upper back pain   YEP:Dlagzrupcz  [name] is a 48 y.o. female coming in with complaint of back and neck pain. OMT 09/24/2023. Patient states doing the same. No new symptoms.  Headaches are still there but nothing as severe as they have been in the past.  Medications patient has been prescribed: Zoloft   Taking:         Reviewed prior external information including notes and imaging from previsou exam, outside providers and external EMR if available.   As well as notes that were available from care everywhere and other healthcare systems.  Past medical history, social, surgical and family history all reviewed in electronic medical record.  No pertanent information unless stated regarding to the chief complaint.   Past Medical History:  Diagnosis Date   Anxiety    Celiac sprue    Headache    migraines   TMJ arthralgia     Allergies  Allergen Reactions   Topamax  [Topiramate ] Other (See Comments)    Couldn't function     Review of Systems:  No  visual changes, nausea, vomiting, diarrhea, constipation, dizziness, abdominal pain, skin rash, fevers, chills, night sweats, weight loss, swollen lymph nodes, body aches, joint swelling, chest pain, shortness of breath, mood changes. POSITIVE muscle aches, headache  Objective  Pulse 73, height 5' 4 (1.626 m), weight 122 lb (55.3 kg), SpO2 98%.   General: No apparent distress alert and oriented x3 mood and affect normal, dressed appropriately.  HEENT: Pupils equal, extraocular movements intact  Respiratory: Patient's speak in full sentences and does not appear short of breath  Cardiovascular: No lower extremity edema, non tender, no erythema  Neck exam does have some  loss lordosis.  Some tenderness to palpation in the paraspinal musculature.  Patient does have some limitation in sidebending.  Significant tightness in the right shoulder region with multiple trigger points.  Patient has a couple on the left side as well.  Osteopathic findings  C2 flexed rotated and side bent right C6 flexed rotated and side bent left T3 extended rotated and side bent right inhaled rib T5 extended rotated and side bent right T7 extended rotated and side bent left T9 extended rotated and side bent left L2 flexed rotated and side bent right Sacrum right on right   After verbal consent patient was prepped with alcohol swab and with a 25-gauge needle injecting into the trapezius, rhomboid, latissimus dorsi as well as the levator scapula on the left and right side.  Total of 4 spaces were injected.  A total of 3 cc of 0.5% Marcaine and 1 cc of Kenalog 40 mg used.  Minimal blood loss.  Band-Aid placed.  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  Has responded well to injections previously and hopeful this will make even more of an improvement.  Discussed with patient about icing regimen and home exercises, increase activity slowly.  Follow-up with me again in 6 to 8 weeks otherwise.  Follow-up with me again in 6 to 8 weeks.    Nonallopathic problems  Decision today to treat with OMT was based on Physical Exam  After verbal consent patient  was treated with HVLA, ME, FPR techniques in cervical, rib, thoracic, lumbar, and sacral  areas  Patient tolerated the procedure well with improvement in symptoms  Patient given exercises, stretches and lifestyle modifications  See medications in patient instructions if given  Patient will follow up in 4-8 weeks    The above documentation has been reviewed and is accurate and complete [name] M Abigail Hustead, DO          Note: This dictation  was prepared with Dragon dictation along with smaller phrase technology. Any transcriptional errors that result from this process are unintentional.

## 2023-12-26 ENCOUNTER — Encounter: Payer: Self-pay | Admitting: Family Medicine

## 2023-12-26 ENCOUNTER — Ambulatory Visit: Admitting: Family Medicine

## 2023-12-26 VITALS — HR 73 | Ht 64.0 in | Wt 122.0 lb

## 2023-12-26 DIAGNOSIS — M9903 Segmental and somatic dysfunction of lumbar region: Secondary | ICD-10-CM

## 2023-12-26 DIAGNOSIS — M25512 Pain in left shoulder: Secondary | ICD-10-CM | POA: Diagnosis not present

## 2023-12-26 DIAGNOSIS — M9904 Segmental and somatic dysfunction of sacral region: Secondary | ICD-10-CM

## 2023-12-26 DIAGNOSIS — M9908 Segmental and somatic dysfunction of rib cage: Secondary | ICD-10-CM

## 2023-12-26 DIAGNOSIS — M25511 Pain in right shoulder: Secondary | ICD-10-CM | POA: Diagnosis not present

## 2023-12-26 DIAGNOSIS — M9902 Segmental and somatic dysfunction of thoracic region: Secondary | ICD-10-CM | POA: Diagnosis not present

## 2023-12-26 DIAGNOSIS — M9901 Segmental and somatic dysfunction of cervical region: Secondary | ICD-10-CM | POA: Diagnosis not present

## 2023-12-26 NOTE — Assessment & Plan Note (Signed)
 Chronic, with another exacerbation.  Has responded well to injections previously and hopeful this will make even more of an improvement.  Discussed with patient about icing regimen and home exercises, increase activity slowly.  Follow-up with me again in 6 to 8 weeks otherwise.  Follow-up with me again in 6 to 8 weeks.

## 2023-12-26 NOTE — Patient Instructions (Signed)
Trigger point injections today Good to see you! See you again in 3 months

## 2024-01-17 ENCOUNTER — Ambulatory Visit: Admitting: Family Medicine

## 2024-01-27 DIAGNOSIS — G43711 Chronic migraine without aura, intractable, with status migrainosus: Secondary | ICD-10-CM | POA: Diagnosis not present

## 2024-02-10 ENCOUNTER — Telehealth: Payer: Self-pay

## 2024-02-10 ENCOUNTER — Other Ambulatory Visit (HOSPITAL_COMMUNITY): Payer: Self-pay

## 2024-02-10 NOTE — Telephone Encounter (Signed)
 Pharmacy Patient Advocate Encounter  Received notification from [facility] that Prior Authorization for Ubrelvy  100mg  Tablets has been APPROVED from 02/10/2024 to 02/09/2025. Ran test claim, Copay is $0. This test claim was processed through [facility]- copay amounts may vary at other pharmacies due to pharmacy/plan contracts, or as the patient moves through the different stages of their insurance plan.   PA #/Case ID/Reference #: [id number]

## 2024-02-10 NOTE — Telephone Encounter (Signed)
 Pharmacy Patient Advocate Encounter   Received notification from Fax that prior authorization for Ubrelvy  100mg  Tablet is required/requested.   Insurance verification completed.   The patient is insured through [facility] .   Per test claim: PA required; PA submitted to above mentioned insurance via Latent Key/confirmation #/EOC [id number] Status is pending

## 2024-02-13 ENCOUNTER — Ambulatory Visit (INDEPENDENT_AMBULATORY_CARE_PROVIDER_SITE_OTHER): Admitting: Neurology

## 2024-02-13 ENCOUNTER — Encounter: Payer: Self-pay | Admitting: Neurology

## 2024-02-13 VITALS — BP 102/61 | HR 77

## 2024-02-13 DIAGNOSIS — G43711 Chronic migraine without aura, intractable, with status migrainosus: Secondary | ICD-10-CM | POA: Diagnosis not present

## 2024-02-13 MED ORDER — ONABOTULINUMTOXINA 200 UNITS IJ SOLR
155.0000 [IU] | Freq: Once | INTRAMUSCULAR | Status: AC
  Administered 2024-02-13: 155 [IU] via INTRAMUSCULAR

## 2024-02-13 MED ORDER — KETOROLAC TROMETHAMINE 60 MG/2ML IM SOLN
60.0000 mg | Freq: Once | INTRAMUSCULAR | Status: AC
  Administered 2024-02-13: 60 mg via INTRAMUSCULAR

## 2024-02-13 NOTE — Progress Notes (Signed)
 Botox - 200 units x 1 vial Lot: [id number] Expiration: 07/2026 NDC: 9976-6078-97   Bacteriostatic 0.9% Sodium Chloride - 4 mL  Lot: [id number] Expiration: 04/24/2025 NDC: 9590-8033-97   Dx: H56.288 S/P Witnessed by Heather RN & Slana RN

## 2024-02-13 NOTE — Progress Notes (Signed)
 Verbal order or Toradol  60mg  IM for pt per Dr. Ines prior to botox . no allergy to toradol  .  Pt gets vasal/vagal response with injections.  Under aseptic technique 60mg  IM to R upper outer gluteal quadrant , tolerated well.  Bandaid applied.

## 2024-02-16 NOTE — Progress Notes (Signed)
 Consent Form  02/16/2024: stable. Patient feels that her migraines cause her jaw to ache in her jaw aching also can cause her migraines to worsen it is a trigger for migraines included 5 units in each masseter to see if that helps with migraine severity.  11/04/2023: doing fantastic, > 60% improved freqof migraines.She has 4 migraine days a month and < 8 total headache days a month. SHE GETS TORADOL  SHOT BEFORE BOTOX . Prescribe/refill Ubrelvy (helps tremendously when used acutely within 1 hour). Tried frovatriptan , sumatriptan, rizatriptan   Meds ordered this encounter  Medications   botulinum toxin Type A  (BOTOX ) injection 155 Units    Botox - 200 units x 1 vial Lot: [id number] Expiration: 07/2026 NDC: 9976-6078-97   Dx: H56.288 S/P Witnessed by Heather RN & Sandy RN   ketorolac  (TORADOL ) injection 60 mg     08/08/2023: doing fantastic, > 60% improved freqof migraines.She has 4 migraine days a month and < 8 total headache days a month. SHE GETS TORADOL  SHOT BEFORE BOTOX .   05/15/2023: Patienmt feels migraines make her jaw hurt and her jaw hurting makes her migraines worse will inject 5U each masseter to see if helps. Will see me for adhd in a week. Stable, doing great on botox  for migraines  02/07/2023: stable 10/18/2022: stable 07/22/2022: stable 12/20/2021: Doing great. She has 4 migraine days a month and < 8 total headache days a month 08/16/2021: stable doing great. Start Ubrelvy   05/11/2021; She is stable, doing fantastic, > 47% improved freqof migraines. She has not reached back out to Dr. MARLA yet for cervical dystonia injections since she was busy, her oldest is going to [facility], she has a 47 year old daughter as well 12/01/2020 Botulism Toxin Injection For Chronic Migraine +a. 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 07/06/2020: Still excellent > 70% improvement in migraine frequency.   04/05/2020: Still excellent > 70% improvemen tin migraine frequency.   Interval history 12/23/2019: Continues excellent response. >70% improvement only 1 mild migraine a month. She saw zach smith at [facility] and feels great and is also having dry needling again.   Interval history 06/04/2019: Continues excellent response, flexeril  really helping. Out of 30 days, she has had 1 migraines which is a drastic improvement. >95% decrease in frequency. She has neck pain ordered dry needling which helped. +a. Doing great on the Gralise .  Ubrelvy  did not work. Has only needed to try Nurtec once.      Consent Form Botulism Toxin Injection For Chronic Migraine    Reviewed orally with patient, additionally signature is on file:  Botulism toxin has been approved by the Federal drug administration for treatment of chronic migraine. Botulism toxin does not cure chronic migraine and it may not be effective in some patients.  The administration of botulism toxin is accomplished by injecting a small amount of toxin into the muscles of the neck and head. Dosage must be titrated for each individual. Any benefits resulting from botulism toxin tend to wear off after 3 months with a repeat injection required if benefit is to be maintained. Injections are usually done every 3-4 months with maximum effect peak achieved by about 2 or 3 weeks. Botulism toxin is expensive and you should be sure of what costs you will incur resulting from the injection.  The side effects of botulism toxin use for chronic migraine may include:   -Transient, and usually mild, facial weakness with facial injections  -Transient, and usually mild, head or neck weakness with head/neck injections  -Reduction or loss of forehead facial animation due to forehead muscle weakness  -Eyelid  drooping  -Dry eye  -Pain at the site of injection or bruising at the site of injection  -Double vision  -Potential unknown long term risks  Contraindications: You should not have Botox  if you are pregnant, nursing, allergic to albumin, have an infection, skin condition, or muscle weakness at the site of the injection, or have myasthenia gravis, Lambert-Eaton syndrome, or ALS.  It is also possible that as with any injection, there may be an allergic reaction or no effect from the medication. Reduced effectiveness after repeated injections is sometimes seen and rarely infection at the injection site may occur. All care will be taken to prevent these side effects. If therapy is given over a long time, atrophy and wasting in the muscle injected may occur. Occasionally the patient's become refractory to treatment because they develop antibodies to the toxin. In this event, therapy needs to be modified.  I have read the above information and consent to the administration of botulism toxin.    BOTOX  PROCEDURE NOTE FOR MIGRAINE HEADACHE    Contraindications and precautions discussed with patient(above). Aseptic procedure was observed and patient tolerated procedure. Procedure performed by Dr. Andree Epp  The condition has existed for more than 6 months, and pt does not have a diagnosis of ALS, Myasthenia Gravis or Lambert-Eaton Syndrome.  Risks and benefits of injections discussed and pt agrees to proceed with the procedure.  Written consent obtained  These injections are medically necessary. Pt  receives good benefits from these injections. These injections do not cause sedations or hallucinations which the oral therapies may cause.  Description of procedure:  The patient was placed in a sitting position. The standard protocol was used for Botox  as follows, with 5 units of Botox  injected at each site:   -Procerus muscle, midline injection  -Corrugator muscle, bilateral  injection  -Frontalis muscle, bilateral injection, with 2 sites each side, medial injection was performed in the upper one third of the frontalis muscle, in the region vertical from the medial inferior edge of the superior orbital rim. The lateral injection was again in the upper one third of the forehead vertically above the lateral limbus of the cornea, 1.5 cm lateral to the medial injection site.  -Temporalis muscle injection, 4 sites, bilaterally. The first injection was 3 cm above the tragus of the ear, second injection site was 1.5 cm to 3 cm up from the first injection site in line with the tragus of the ear. The third injection site was 1.5-3 cm forward between the first 2 injection sites. The fourth injection site was 1.5 cm posterior to the second injection site.   -Occipitalis muscle injection, 3 sites, bilaterally. The first injection was done one half way between the occipital protuberance and the tip of the mastoid process behind the ear. The second injection site was done lateral and superior to the first, 1 fingerbreadth from the first injection. The third injection site was 1 fingerbreadth superiorly and medially from the first injection site.  -Cervical paraspinal muscle injection, 2 sites, bilateral knee first injection site was 1 cm from the midline of the cervical spine, 3 cm inferior to the lower border of the occipital protuberance. The second injection site was  1.5 cm superiorly and laterally to the first injection site.  -Trapezius muscle injection was performed at 3 sites, bilaterally. The first injection site was in the upper trapezius muscle halfway between the inflection point of the neck, and the acromion. The second injection site was one half way between the acromion and the first injection site. The third injection was done between the first injection site and the inflection point of the neck.   Will return for repeat injection in 3 months.   165 units of Botox  was  used, 35u Botox  not injected was wasted. The patient tolerated the procedure well, there were no complications of the above procedure.

## 2024-02-17 NOTE — Progress Notes (Unsigned)
 [name] [address] [place] [address] Phone: [phone] Subjective:   [name], am serving as a scribe for Dr. Arthea Claudene.  I'm seeing this patient by the request  of:  Kip Righter, MD  CC: Neck and back pain follow-up  YEP:Dlagzrupcz  [name] is a 48 y.o. female coming in with complaint of back and neck pain. OMT 12/26/2023. Patient states that she is having L sided cervical spine tightness.   Medications patient has been prescribed: None  Taking:         Reviewed prior external information including notes and imaging from previsou exam, outside providers and external EMR if available.   As well as notes that were available from care everywhere and other healthcare systems.  Past medical history, social, surgical and family history all reviewed in electronic medical record.  No pertanent information unless stated regarding to the chief complaint.   Past Medical History:  Diagnosis Date   Anxiety    Celiac sprue    Headache    migraines   TMJ arthralgia     Allergies  Allergen Reactions   Topamax  [Topiramate ] Other (See Comments)    Couldn't function     Review of Systems:  No , visual changes, nausea, vomiting, diarrhea, constipation, dizziness, abdominal pain, skin rash, fevers, chills, night sweats, weight loss, swollen lymph nodes, body aches, joint swelling, chest pain, shortness of breath POSITIVE muscle aches, headache, mood changes  Objective  Blood pressure 98/72, pulse 74, height 5' 4 (1.626 m), weight 122 lb (55.3 kg), SpO2 98%.   General: No apparent distress alert and oriented x3 mood and affect normal, dressed appropriately.  HEENT: Pupils equal, extraocular movements intact  Respiratory: Patient's speak in full sentences and does not appear short of breath  Cardiovascular: No lower extremity edema, non tender, no erythema  Exam does have some loss lordosis noted.  Some tenderness to  palpation in the paraspinal musculature.  Tightness with sidebending with limited range of motion with rotation to the left of the neck.  Negative Spurling's  Osteopathic findings  C2 flexed rotated and side bent right C5 flexed rotated and side bent left T4 extended rotated and side bent right inhaled rib T6 extended rotated and side bent left L2 flexed rotated and side bent right Sacrum right on right       Assessment and Plan:  Cervicogenic headache Patient is unable to tolerate the Effexor  or the Cymbalta  at this time.  Hydroxyzine  given for some of the anxiety component that could be contributing as well.  Discussed icing regimen and home exercises, which activities to do and which ones to avoid.  Increase activity slowly.  Follow-up again 6 to 8 weeks.    Nonallopathic problems  Decision today to treat with OMT was based on Physical Exam  After verbal consent patient was treated with HVLA, ME, FPR techniques in cervical, rib, thoracic, lumbar, and sacral  areas  Patient tolerated the procedure well with improvement in symptoms  Patient given exercises, stretches and lifestyle modifications  See medications in patient instructions if given  Patient will follow up in 4-8 weeks     The above documentation has been reviewed and is accurate and complete Abigail Ehle M Whitlee Sluder, DO         Note: This dictation was prepared with Dragon dictation along with smaller phrase technology. Any transcriptional errors that result from this process are unintentional.

## 2024-02-19 ENCOUNTER — Ambulatory Visit (INDEPENDENT_AMBULATORY_CARE_PROVIDER_SITE_OTHER): Admitting: Family Medicine

## 2024-02-19 ENCOUNTER — Encounter: Payer: Self-pay | Admitting: Family Medicine

## 2024-02-19 VITALS — BP 98/72 | HR 74 | Ht 64.0 in | Wt 122.0 lb

## 2024-02-19 DIAGNOSIS — M9904 Segmental and somatic dysfunction of sacral region: Secondary | ICD-10-CM | POA: Diagnosis not present

## 2024-02-19 DIAGNOSIS — M9903 Segmental and somatic dysfunction of lumbar region: Secondary | ICD-10-CM | POA: Diagnosis not present

## 2024-02-19 DIAGNOSIS — M9901 Segmental and somatic dysfunction of cervical region: Secondary | ICD-10-CM | POA: Diagnosis not present

## 2024-02-19 DIAGNOSIS — M9908 Segmental and somatic dysfunction of rib cage: Secondary | ICD-10-CM | POA: Diagnosis not present

## 2024-02-19 DIAGNOSIS — G4486 Cervicogenic headache: Secondary | ICD-10-CM | POA: Diagnosis not present

## 2024-02-19 DIAGNOSIS — M9902 Segmental and somatic dysfunction of thoracic region: Secondary | ICD-10-CM

## 2024-02-19 MED ORDER — HYDROXYZINE HCL 10 MG PO TABS: 10.0000 mg | ORAL_TABLET | Freq: Three times a day (TID) | ORAL | 1 refills | Status: DC | PRN

## 2024-02-19 NOTE — Patient Instructions (Addendum)
 Great to see you Hydroxizine 10mg   Have fun at the beach See me in 2 months

## 2024-02-19 NOTE — Assessment & Plan Note (Signed)
 Patient is unable to tolerate the Effexor  or the Cymbalta  at this time.  Hydroxyzine  given for some of the anxiety component that could be contributing as well.  Discussed icing regimen and home exercises, which activities to do and which ones to avoid.  Increase activity slowly.  Follow-up again 6 to 8 weeks.

## 2024-02-20 DIAGNOSIS — H2513 Age-related nuclear cataract, bilateral: Secondary | ICD-10-CM | POA: Diagnosis not present

## 2024-02-20 DIAGNOSIS — H43813 Vitreous degeneration, bilateral: Secondary | ICD-10-CM | POA: Diagnosis not present

## 2024-02-20 DIAGNOSIS — H25013 Cortical age-related cataract, bilateral: Secondary | ICD-10-CM | POA: Diagnosis not present

## 2024-03-04 DIAGNOSIS — K900 Celiac disease: Secondary | ICD-10-CM | POA: Diagnosis not present

## 2024-03-04 DIAGNOSIS — G43909 Migraine, unspecified, not intractable, without status migrainosus: Secondary | ICD-10-CM | POA: Diagnosis not present

## 2024-03-04 DIAGNOSIS — F411 Generalized anxiety disorder: Secondary | ICD-10-CM | POA: Diagnosis not present

## 2024-04-01 ENCOUNTER — Telehealth: Payer: Self-pay | Admitting: Neurology

## 2024-04-01 NOTE — Telephone Encounter (Signed)
 LVM and sent MyChart msg informing pt of r/s needed for 11/18 Botox - MD departure.

## 2024-04-21 NOTE — Progress Notes (Deleted)
  [name] [address] [place] [address] Phone: [phone] Subjective:    I'm seeing this patient by the request  of:  Kip Righter, MD  CC:   YEP:Dlagzrupcz  [name] is a 48 y.o. female coming in with complaint of back and neck pain. OMT 02/19/2024. Patient states   Medications patient has been prescribed: Hydroxizine  Taking:         Reviewed prior external information including notes and imaging from previsou exam, outside providers and external EMR if available.   As well as notes that were available from care everywhere and other healthcare systems.  Past medical history, social, surgical and family history all reviewed in electronic medical record.  No pertanent information unless stated regarding to the chief complaint.   Past Medical History:  Diagnosis Date   Anxiety    Celiac sprue    Headache    migraines   TMJ arthralgia     Allergies  Allergen Reactions   Topamax  [Topiramate ] Other (See Comments)    Couldn't function     Review of Systems:  No headache, visual changes, nausea, vomiting, diarrhea, constipation, dizziness, abdominal pain, skin rash, fevers, chills, night sweats, weight loss, swollen lymph nodes, body aches, joint swelling, chest pain, shortness of breath, mood changes. POSITIVE muscle aches  Objective  There were no vitals taken for this visit.   General: No apparent distress alert and oriented x3 mood and affect normal, dressed appropriately.  HEENT: Pupils equal, extraocular movements intact  Respiratory: Patient's speak in full sentences and does not appear short of breath  Cardiovascular: No lower extremity edema, non tender, no erythema  Gait MSK:  Back   Osteopathic findings  C2 flexed rotated and side bent right C6 flexed rotated and side bent left T3 extended rotated and side bent right inhaled rib T9 extended rotated and side bent left L2 flexed rotated and side bent  right Sacrum right on right       Assessment and Plan:  No problem-specific Assessment & Plan notes found for this encounter.    Nonallopathic problems  Decision today to treat with OMT was based on Physical Exam  After verbal consent patient was treated with HVLA, ME, FPR techniques in cervical, rib, thoracic, lumbar, and sacral  areas  Patient tolerated the procedure well with improvement in symptoms  Patient given exercises, stretches and lifestyle modifications  See medications in patient instructions if given  Patient will follow up in 4-8 weeks             Note: This dictation was prepared with Dragon dictation along with smaller phrase technology. Any transcriptional errors that result from this process are unintentional.

## 2024-04-22 ENCOUNTER — Ambulatory Visit: Admitting: Family Medicine

## 2024-04-28 DIAGNOSIS — G43711 Chronic migraine without aura, intractable, with status migrainosus: Secondary | ICD-10-CM | POA: Diagnosis not present

## 2024-05-12 ENCOUNTER — Ambulatory Visit: Admitting: Neurology

## 2024-05-12 NOTE — Progress Notes (Deleted)
  [name] [address] [place] [address] Phone: [phone] Subjective:    I'm seeing this patient by the request  of:  Kip Righter, MD  CC:   [name]  [name] is a 48 y.o. female coming in with complaint of back and neck pain. OMT 02/19/2024. Patient states   Medications patient has been prescribed: Hydroxizine  Taking:         Reviewed prior external information including notes and imaging from previsou exam, outside providers and external EMR if available.   As well as notes that were available from care everywhere and other healthcare systems.  Past medical history, social, surgical and family history all reviewed in electronic medical record.  No pertanent information unless stated regarding to the chief complaint.   Past Medical History:  Diagnosis Date   Anxiety    Celiac sprue    Headache    migraines   TMJ arthralgia     Allergies  Allergen Reactions   Topamax  [Topiramate ] Other (See Comments)    Couldn't function     Review of Systems:  No headache, visual changes, nausea, vomiting, diarrhea, constipation, dizziness, abdominal pain, skin rash, fevers, chills, night sweats, weight loss, swollen lymph nodes, body aches, joint swelling, chest pain, shortness of breath, mood changes. POSITIVE muscle aches  Objective  There were no vitals taken for this visit.   General: No apparent distress alert and oriented x3 mood and affect normal, dressed appropriately.  HEENT: Pupils equal, extraocular movements intact  Respiratory: Patient's speak in full sentences and does not appear short of breath  Cardiovascular: No lower extremity edema, non tender, no erythema  Gait MSK:  Back   Osteopathic findings  C2 flexed rotated and side bent right C6 flexed rotated and side bent left T3 extended rotated and side bent right inhaled rib T9 extended rotated and side bent left L2 flexed rotated and side bent  right Sacrum right on right       Assessment and Plan:  No problem-specific Assessment & Plan notes found for this encounter.    Nonallopathic problems  Decision today to treat with OMT was based on Physical Exam  After verbal consent patient was treated with HVLA, ME, FPR techniques in cervical, rib, thoracic, lumbar, and sacral  areas  Patient tolerated the procedure well with improvement in symptoms  Patient given exercises, stretches and lifestyle modifications  See medications in patient instructions if given  Patient will follow up in 4-8 weeks             Note: This dictation was prepared with Dragon dictation along with smaller phrase technology. Any transcriptional errors that result from this process are unintentional.

## 2024-05-13 ENCOUNTER — Ambulatory Visit: Admitting: Family Medicine

## 2024-05-14 ENCOUNTER — Ambulatory Visit (INDEPENDENT_AMBULATORY_CARE_PROVIDER_SITE_OTHER): Admitting: Adult Health

## 2024-05-14 ENCOUNTER — Encounter: Payer: Self-pay | Admitting: Adult Health

## 2024-05-14 VITALS — BP 110/60

## 2024-05-14 DIAGNOSIS — G43711 Chronic migraine without aura, intractable, with status migrainosus: Secondary | ICD-10-CM | POA: Diagnosis not present

## 2024-05-14 MED ORDER — KETOROLAC TROMETHAMINE 60 MG/2ML IM SOLN
60.0000 mg | Freq: Once | INTRAMUSCULAR | Status: AC
  Administered 2024-05-14: 60 mg via INTRAMUSCULAR

## 2024-05-14 MED ORDER — ONABOTULINUMTOXINA 200 UNITS IJ SOLR
155.0000 [IU] | Freq: Once | INTRAMUSCULAR | Status: AC
  Administered 2024-05-14: 155 [IU] via INTRAMUSCULAR

## 2024-05-14 NOTE — Progress Notes (Signed)
 Per [place],NP please give patient 60mg  Toradol  injection . Gave Injection placed bandade on injection site .Juliauna,Np in  room giving Botox  injections to patient

## 2024-05-14 NOTE — Progress Notes (Signed)
 05/14/24: Previously getting Botox  through Dr. Darleen.  She typically gets a Toradol  injection prior to Botox .  She was given Toradol  60 mg IM today. 2 headaches a month. Uses ubrevly. Has to laid down while getting botox  due to syncope events in the past.    BOTOX  PROCEDURE NOTE FOR MIGRAINE HEADACHE    Contraindications and precautions discussed with patient(above). Aseptic procedure was observed and patient tolerated procedure. Procedure performed by Duwaine Russell, NP  The condition has existed for more than 6 months, and pt does not have a diagnosis of ALS, Myasthenia Gravis or Lambert-Eaton Syndrome.  Risks and benefits of injections discussed and pt agrees to proceed with the procedure.  Written consent obtained  These injections are medically necessary.  These injections do not cause sedations or hallucinations which the oral therapies may cause.  Indication/Diagnosis: chronic migraine BOTOX (G9414) injection was performed according to protocol by [employer]. 200 units of BOTOX  was dissolved into 4 cc NS.   NDC: [address]-98  Type of toxin: Botox   Botox - 200 units x 1 vial Lot: [id number] Expiration: 12/22/25 NDC: 9976-6078-97   Bacteriostatic 0.9% Sodium Chloride - 4 mL  Lot: [id number] Expiration: 04/24/25 NDC: 9590-8033-97   Dx: H56.288    Description of procedure:  The patient was placed in a sitting position. The standard protocol was used for Botox  as follows, with 5 units of Botox  injected at each site:   -Procerus muscle, midline injection  -Corrugator muscle, bilateral injection  -Frontalis muscle, bilateral injection, with 2 sites each side, medial injection was performed in the upper one third of the frontalis muscle, in the region vertical from the medial inferior edge of the superior orbital rim. The lateral injection was again in the upper one third of the forehead vertically above the lateral limbus of the cornea, 1.5 cm lateral to the medial injection  site.  -Temporalis muscle injection, 4 sites, bilaterally. The first injection was 3 cm above the tragus of the ear, second injection site was 1.5 cm to 3 cm up from the first injection site in line with the tragus of the ear. The third injection site was 1.5-3 cm forward between the first 2 injection sites. The fourth injection site was 1.5 cm posterior to the second injection site.  -Occipitalis muscle injection, 3 sites, bilaterally. The first injection was done one half way between the occipital protuberance and the tip of the mastoid process behind the ear. The second injection site was done lateral and superior to the first, 1 fingerbreadth from the first injection. The third injection site was 1 fingerbreadth superiorly and medially from the first injection site.  -Cervical paraspinal muscle injection, 2 sites, bilateral knee first injection site was 1 cm from the midline of the cervical spine, 3 cm inferior to the lower border of the occipital protuberance. The second injection site was 1.5 cm superiorly and laterally to the first injection site.  -Trapezius muscle injection was performed at 3 sites, bilaterally. The first injection site was in the upper trapezius muscle halfway between the inflection point of the neck, and the acromion. The second injection site was one half way between the acromion and the first injection site. The third injection was done between the first injection site and the inflection point of the neck.   Will return for repeat injection in 3 months.   A 200 unit sof Botox  was used, 155 units were injected, the rest of the Botox  was wasted. The patient tolerated the procedure well, there  were no complications of the above procedure.  Duwaine Russell, MSN, NP-C 05/14/2024, 2:17 PM [facility] [address] [place], [place] [address] [phone]

## 2024-05-14 NOTE — Progress Notes (Signed)
 Botox - 200 units x 1 vial Lot: [id number] Expiration: 12/22/25 NDC: 9976-6078-97  Bacteriostatic 0.9% Sodium Chloride - 4 mL  Lot: [id number] Expiration: 04/24/25 NDC: 9590-8033-97  Dx: H56.288 S/P  Witnessed by Stephania, CMA

## 2024-05-26 ENCOUNTER — Ambulatory Visit: Admitting: Adult Health

## 2024-06-01 DIAGNOSIS — J069 Acute upper respiratory infection, unspecified: Secondary | ICD-10-CM | POA: Diagnosis not present

## 2024-07-07 MED ORDER — BOTOX 200 UNITS IJ SOLR: INTRAMUSCULAR | 2 refills | Status: AC

## 2024-07-07 NOTE — Addendum Note (Signed)
 Addended by: SHONA SAVANT A on: 07/07/2024 03:50 PM   Modules accepted: Orders

## 2024-07-07 NOTE — Telephone Encounter (Signed)
 [facility] is requesting refills for Botox  please.

## 2024-07-23 ENCOUNTER — Other Ambulatory Visit: Payer: Self-pay | Admitting: Family Medicine

## 2024-08-14 ENCOUNTER — Ambulatory Visit: Admitting: Adult Health

## 2024-08-31 ENCOUNTER — Ambulatory Visit: Admitting: Adult Health
